# Patient Record
Sex: Female | Born: 1960 | Race: Black or African American | Hispanic: No | Marital: Single | Smoking: Never smoker
Health system: Southern US, Community
[De-identification: ages and names within clinical notes are randomized; demographics above are authoritative.]

## PROBLEM LIST (undated history)

## (undated) DIAGNOSIS — I639 Cerebral infarction, unspecified: Secondary | ICD-10-CM

## (undated) DIAGNOSIS — I10 Essential (primary) hypertension: Secondary | ICD-10-CM

## (undated) DIAGNOSIS — F329 Major depressive disorder, single episode, unspecified: Secondary | ICD-10-CM

## (undated) DIAGNOSIS — D649 Anemia, unspecified: Secondary | ICD-10-CM

## (undated) DIAGNOSIS — B1920 Unspecified viral hepatitis C without hepatic coma: Secondary | ICD-10-CM

## (undated) DIAGNOSIS — F32A Depression, unspecified: Secondary | ICD-10-CM

## (undated) DIAGNOSIS — J45909 Unspecified asthma, uncomplicated: Secondary | ICD-10-CM

## (undated) DIAGNOSIS — T7840XA Allergy, unspecified, initial encounter: Secondary | ICD-10-CM

## (undated) HISTORY — DX: Unspecified asthma, uncomplicated: J45.909

## (undated) HISTORY — DX: Anemia, unspecified: D64.9

## (undated) HISTORY — DX: Allergy, unspecified, initial encounter: T78.40XA

## (undated) HISTORY — PX: ANKLE FRACTURE SURGERY: SHX122

## (undated) HISTORY — PX: ABDOMINAL HYSTERECTOMY: SHX81

## (undated) HISTORY — DX: Major depressive disorder, single episode, unspecified: F32.9

## (undated) HISTORY — DX: Depression, unspecified: F32.A

---

## 1997-07-24 ENCOUNTER — Emergency Department (HOSPITAL_COMMUNITY): Admission: EM | Admit: 1997-07-24 | Discharge: 1997-07-24 | Payer: Self-pay | Admitting: *Deleted

## 1997-09-02 ENCOUNTER — Other Ambulatory Visit: Admission: RE | Admit: 1997-09-02 | Discharge: 1997-09-02 | Payer: Self-pay

## 1997-09-10 ENCOUNTER — Ambulatory Visit (HOSPITAL_COMMUNITY): Admission: RE | Admit: 1997-09-10 | Discharge: 1997-09-10 | Payer: Self-pay | Admitting: Family Medicine

## 1998-01-29 ENCOUNTER — Encounter: Payer: Self-pay | Admitting: Emergency Medicine

## 1998-01-29 ENCOUNTER — Emergency Department (HOSPITAL_COMMUNITY): Admission: EM | Admit: 1998-01-29 | Discharge: 1998-01-29 | Payer: Self-pay | Admitting: Emergency Medicine

## 1998-04-03 ENCOUNTER — Encounter: Payer: Self-pay | Admitting: Orthopedic Surgery

## 1998-04-04 ENCOUNTER — Ambulatory Visit (HOSPITAL_COMMUNITY): Admission: RE | Admit: 1998-04-04 | Discharge: 1998-04-04 | Payer: Self-pay | Admitting: Orthopedic Surgery

## 1998-04-13 ENCOUNTER — Observation Stay (HOSPITAL_COMMUNITY): Admission: RE | Admit: 1998-04-13 | Discharge: 1998-04-14 | Payer: Self-pay | Admitting: Orthopedic Surgery

## 1998-04-13 ENCOUNTER — Encounter: Payer: Self-pay | Admitting: Orthopedic Surgery

## 1998-07-13 ENCOUNTER — Ambulatory Visit (HOSPITAL_COMMUNITY): Admission: RE | Admit: 1998-07-13 | Discharge: 1998-07-13 | Payer: Self-pay | Admitting: Orthopedic Surgery

## 1999-03-11 ENCOUNTER — Emergency Department (HOSPITAL_COMMUNITY): Admission: EM | Admit: 1999-03-11 | Discharge: 1999-03-11 | Payer: Self-pay | Admitting: Emergency Medicine

## 1999-12-27 ENCOUNTER — Other Ambulatory Visit: Admission: RE | Admit: 1999-12-27 | Discharge: 1999-12-27 | Payer: Self-pay | Admitting: Obstetrics

## 2000-01-13 ENCOUNTER — Ambulatory Visit (HOSPITAL_COMMUNITY): Admission: RE | Admit: 2000-01-13 | Discharge: 2000-01-13 | Payer: Self-pay | Admitting: Obstetrics

## 2000-01-13 ENCOUNTER — Encounter: Payer: Self-pay | Admitting: Obstetrics

## 2000-06-26 ENCOUNTER — Emergency Department (HOSPITAL_COMMUNITY): Admission: EM | Admit: 2000-06-26 | Discharge: 2000-06-26 | Payer: Self-pay | Admitting: Emergency Medicine

## 2001-02-27 ENCOUNTER — Encounter: Payer: Self-pay | Admitting: Obstetrics

## 2001-02-27 ENCOUNTER — Inpatient Hospital Stay (HOSPITAL_COMMUNITY): Admission: AD | Admit: 2001-02-27 | Discharge: 2001-02-27 | Payer: Self-pay | Admitting: Obstetrics

## 2001-03-19 ENCOUNTER — Emergency Department (HOSPITAL_COMMUNITY): Admission: EM | Admit: 2001-03-19 | Discharge: 2001-03-19 | Payer: Self-pay | Admitting: Emergency Medicine

## 2001-03-19 ENCOUNTER — Encounter: Payer: Self-pay | Admitting: Emergency Medicine

## 2002-05-29 ENCOUNTER — Encounter: Payer: Self-pay | Admitting: *Deleted

## 2002-05-29 ENCOUNTER — Emergency Department (HOSPITAL_COMMUNITY): Admission: EM | Admit: 2002-05-29 | Discharge: 2002-05-29 | Payer: Self-pay | Admitting: *Deleted

## 2003-07-14 ENCOUNTER — Emergency Department (HOSPITAL_COMMUNITY): Admission: EM | Admit: 2003-07-14 | Discharge: 2003-07-14 | Payer: Self-pay | Admitting: *Deleted

## 2004-04-05 ENCOUNTER — Emergency Department (HOSPITAL_COMMUNITY): Admission: EM | Admit: 2004-04-05 | Discharge: 2004-04-05 | Payer: Self-pay | Admitting: Emergency Medicine

## 2004-07-23 ENCOUNTER — Ambulatory Visit (HOSPITAL_COMMUNITY): Admission: RE | Admit: 2004-07-23 | Discharge: 2004-07-23 | Payer: Self-pay | Admitting: Family Medicine

## 2004-11-26 ENCOUNTER — Emergency Department (HOSPITAL_COMMUNITY): Admission: EM | Admit: 2004-11-26 | Discharge: 2004-11-26 | Payer: Self-pay | Admitting: Family Medicine

## 2005-07-15 ENCOUNTER — Encounter: Admission: RE | Admit: 2005-07-15 | Discharge: 2005-07-15 | Payer: Self-pay | Admitting: Cardiology

## 2005-10-12 ENCOUNTER — Emergency Department (HOSPITAL_COMMUNITY): Admission: EM | Admit: 2005-10-12 | Discharge: 2005-10-12 | Payer: Self-pay | Admitting: Emergency Medicine

## 2006-01-06 ENCOUNTER — Ambulatory Visit: Payer: Self-pay | Admitting: Internal Medicine

## 2006-01-25 ENCOUNTER — Ambulatory Visit: Payer: Self-pay | Admitting: Internal Medicine

## 2006-01-27 ENCOUNTER — Ambulatory Visit: Payer: Self-pay | Admitting: Internal Medicine

## 2006-01-27 ENCOUNTER — Ambulatory Visit: Payer: Self-pay | Admitting: *Deleted

## 2006-02-03 ENCOUNTER — Ambulatory Visit: Payer: Self-pay | Admitting: Internal Medicine

## 2006-02-24 ENCOUNTER — Emergency Department (HOSPITAL_COMMUNITY): Admission: EM | Admit: 2006-02-24 | Discharge: 2006-02-24 | Payer: Self-pay | Admitting: Emergency Medicine

## 2006-04-24 ENCOUNTER — Emergency Department (HOSPITAL_COMMUNITY): Admission: EM | Admit: 2006-04-24 | Discharge: 2006-04-24 | Payer: Self-pay | Admitting: Emergency Medicine

## 2006-05-25 ENCOUNTER — Emergency Department (HOSPITAL_COMMUNITY): Admission: EM | Admit: 2006-05-25 | Discharge: 2006-05-25 | Payer: Self-pay | Admitting: Emergency Medicine

## 2006-08-20 ENCOUNTER — Emergency Department (HOSPITAL_COMMUNITY): Admission: EM | Admit: 2006-08-20 | Discharge: 2006-08-20 | Payer: Self-pay | Admitting: *Deleted

## 2006-10-25 ENCOUNTER — Encounter (INDEPENDENT_AMBULATORY_CARE_PROVIDER_SITE_OTHER): Payer: Self-pay | Admitting: *Deleted

## 2006-11-27 DIAGNOSIS — R809 Proteinuria, unspecified: Secondary | ICD-10-CM | POA: Insufficient documentation

## 2006-11-27 DIAGNOSIS — I10 Essential (primary) hypertension: Secondary | ICD-10-CM

## 2006-11-27 DIAGNOSIS — E119 Type 2 diabetes mellitus without complications: Secondary | ICD-10-CM

## 2006-12-18 ENCOUNTER — Emergency Department (HOSPITAL_COMMUNITY): Admission: EM | Admit: 2006-12-18 | Discharge: 2006-12-18 | Payer: Self-pay | Admitting: Emergency Medicine

## 2007-01-18 ENCOUNTER — Ambulatory Visit: Payer: Self-pay | Admitting: Internal Medicine

## 2007-02-16 ENCOUNTER — Ambulatory Visit: Payer: Self-pay | Admitting: Family Medicine

## 2007-02-19 ENCOUNTER — Ambulatory Visit: Payer: Self-pay | Admitting: Internal Medicine

## 2007-04-10 ENCOUNTER — Encounter: Payer: Self-pay | Admitting: Internal Medicine

## 2007-04-10 ENCOUNTER — Ambulatory Visit: Payer: Self-pay | Admitting: Internal Medicine

## 2007-07-05 ENCOUNTER — Ambulatory Visit: Payer: Self-pay | Admitting: Internal Medicine

## 2007-07-24 ENCOUNTER — Emergency Department (HOSPITAL_COMMUNITY): Admission: EM | Admit: 2007-07-24 | Discharge: 2007-07-24 | Payer: Self-pay | Admitting: Emergency Medicine

## 2007-10-29 ENCOUNTER — Emergency Department (HOSPITAL_COMMUNITY): Admission: EM | Admit: 2007-10-29 | Discharge: 2007-10-29 | Payer: Self-pay | Admitting: Family Medicine

## 2007-12-24 ENCOUNTER — Emergency Department (HOSPITAL_COMMUNITY): Admission: EM | Admit: 2007-12-24 | Discharge: 2007-12-24 | Payer: Self-pay | Admitting: Family Medicine

## 2008-02-15 ENCOUNTER — Ambulatory Visit: Payer: Self-pay | Admitting: Internal Medicine

## 2008-02-18 ENCOUNTER — Ambulatory Visit: Payer: Self-pay | Admitting: Internal Medicine

## 2008-03-11 ENCOUNTER — Ambulatory Visit: Payer: Self-pay | Admitting: Internal Medicine

## 2008-03-27 ENCOUNTER — Emergency Department (HOSPITAL_COMMUNITY): Admission: EM | Admit: 2008-03-27 | Discharge: 2008-03-27 | Payer: Self-pay | Admitting: Emergency Medicine

## 2008-04-24 ENCOUNTER — Ambulatory Visit: Payer: Self-pay | Admitting: Internal Medicine

## 2008-07-11 ENCOUNTER — Emergency Department (HOSPITAL_COMMUNITY): Admission: EM | Admit: 2008-07-11 | Discharge: 2008-07-11 | Payer: Self-pay | Admitting: Family Medicine

## 2008-07-24 ENCOUNTER — Inpatient Hospital Stay (HOSPITAL_COMMUNITY): Admission: AD | Admit: 2008-07-24 | Discharge: 2008-07-24 | Payer: Self-pay | Admitting: Obstetrics and Gynecology

## 2008-08-20 ENCOUNTER — Ambulatory Visit: Payer: Self-pay | Admitting: Obstetrics and Gynecology

## 2008-08-20 ENCOUNTER — Other Ambulatory Visit: Admission: RE | Admit: 2008-08-20 | Discharge: 2008-08-20 | Payer: Self-pay | Admitting: Obstetrics and Gynecology

## 2008-09-04 ENCOUNTER — Ambulatory Visit: Payer: Self-pay | Admitting: Obstetrics and Gynecology

## 2008-10-01 ENCOUNTER — Inpatient Hospital Stay (HOSPITAL_COMMUNITY): Admission: AD | Admit: 2008-10-01 | Discharge: 2008-10-01 | Payer: Self-pay | Admitting: Obstetrics and Gynecology

## 2008-10-24 ENCOUNTER — Ambulatory Visit: Payer: Self-pay | Admitting: Internal Medicine

## 2008-10-24 LAB — CONVERTED CEMR LAB
BUN: 11 mg/dL (ref 6–23)
CO2: 21 meq/L (ref 19–32)
Cholesterol: 140 mg/dL (ref 0–200)
Glucose, Bld: 207 mg/dL — ABNORMAL HIGH (ref 70–99)
HDL: 36 mg/dL — ABNORMAL LOW (ref >39)
LDL Cholesterol: 56 mg/dL (ref 0–99)
Sodium: 137 meq/L (ref 135–145)

## 2009-03-18 ENCOUNTER — Inpatient Hospital Stay (HOSPITAL_COMMUNITY): Admission: AD | Admit: 2009-03-18 | Discharge: 2009-03-18 | Payer: Self-pay | Admitting: Obstetrics and Gynecology

## 2009-05-21 ENCOUNTER — Ambulatory Visit: Payer: Self-pay | Admitting: Obstetrics & Gynecology

## 2009-06-10 ENCOUNTER — Ambulatory Visit: Payer: Self-pay | Admitting: Obstetrics & Gynecology

## 2009-07-03 ENCOUNTER — Ambulatory Visit: Payer: Self-pay | Admitting: Internal Medicine

## 2009-09-10 ENCOUNTER — Ambulatory Visit: Payer: Self-pay | Admitting: Obstetrics & Gynecology

## 2009-09-10 ENCOUNTER — Encounter: Payer: Self-pay | Admitting: Obstetrics & Gynecology

## 2009-09-10 ENCOUNTER — Inpatient Hospital Stay (HOSPITAL_COMMUNITY): Admission: RE | Admit: 2009-09-10 | Discharge: 2009-09-12 | Payer: Self-pay | Admitting: Obstetrics & Gynecology

## 2009-09-22 ENCOUNTER — Ambulatory Visit: Payer: Self-pay | Admitting: Family Medicine

## 2009-09-25 ENCOUNTER — Ambulatory Visit: Payer: Self-pay | Admitting: Internal Medicine

## 2010-01-12 ENCOUNTER — Inpatient Hospital Stay (HOSPITAL_COMMUNITY)
Admission: AD | Admit: 2010-01-12 | Discharge: 2010-01-12 | Payer: Self-pay | Source: Home / Self Care | Admitting: Family Medicine

## 2010-02-04 ENCOUNTER — Ambulatory Visit: Payer: Self-pay | Admitting: Obstetrics & Gynecology

## 2010-02-27 ENCOUNTER — Encounter: Payer: Self-pay | Admitting: Obstetrics

## 2010-04-19 LAB — URINALYSIS, ROUTINE W REFLEX MICROSCOPIC
Bilirubin Urine: NEGATIVE
Glucose, UA: 250 mg/dL — AB
Nitrite: NEGATIVE

## 2010-04-19 LAB — URINE MICROSCOPIC-ADD ON

## 2010-04-19 LAB — GC/CHLAMYDIA PROBE AMP, URINE
Chlamydia, Swab/Urine, PCR: NEGATIVE
GC Probe Amp, Urine: NEGATIVE

## 2010-04-19 LAB — WET PREP, GENITAL: Clue Cells Wet Prep HPF POC: NONE SEEN

## 2010-04-19 LAB — URINE CULTURE: Culture  Setup Time: 201112061038

## 2010-04-23 LAB — GLUCOSE, CAPILLARY
Glucose-Capillary: 112 mg/dL — ABNORMAL HIGH (ref 70–99)
Glucose-Capillary: 128 mg/dL — ABNORMAL HIGH (ref 70–99)
Glucose-Capillary: 173 mg/dL — ABNORMAL HIGH (ref 70–99)
Glucose-Capillary: 176 mg/dL — ABNORMAL HIGH (ref 70–99)
Glucose-Capillary: 188 mg/dL — ABNORMAL HIGH (ref 70–99)

## 2010-04-23 LAB — CBC
HCT: 30.4 % — ABNORMAL LOW (ref 36.0–46.0)
Hemoglobin: 10.2 g/dL — ABNORMAL LOW (ref 12.0–15.0)
MCH: 29.1 pg (ref 26.0–34.0)
MCH: 29.9 pg (ref 26.0–34.0)
MCV: 86.8 fL (ref 78.0–100.0)
MCV: 86.9 fL (ref 78.0–100.0)
Platelets: 119 10*3/uL — ABNORMAL LOW (ref 150–400)
Platelets: 138 10*3/uL — ABNORMAL LOW (ref 150–400)
RBC: 3.51 MIL/uL — ABNORMAL LOW (ref 3.87–5.11)
RDW: 13.7 % (ref 11.5–15.5)
RDW: 13.8 % (ref 11.5–15.5)
WBC: 9.1 10*3/uL (ref 4.0–10.5)

## 2010-04-23 LAB — HEMOGLOBIN AND HEMATOCRIT, BLOOD: HCT: 29.3 % — ABNORMAL LOW (ref 36.0–46.0)

## 2010-04-24 LAB — SURGICAL PCR SCREEN
MRSA, PCR: NEGATIVE
Staphylococcus aureus: NEGATIVE

## 2010-04-24 LAB — BASIC METABOLIC PANEL
Calcium: 9.2 mg/dL (ref 8.4–10.5)
GFR calc Af Amer: >60 mL/min (ref >60)
GFR calc non Af Amer: >60 mL/min (ref >60)
Potassium: 4.5 mEq/L (ref 3.5–5.1)
Sodium: 131 mEq/L — ABNORMAL LOW (ref 135–145)

## 2010-04-24 LAB — CBC
HCT: 38.3 % (ref 36.0–46.0)
MCHC: 34.3 g/dL (ref 30.0–36.0)
Platelets: 156 10*3/uL (ref 150–400)
RDW: 13.9 % (ref 11.5–15.5)
WBC: 7.1 10*3/uL (ref 4.0–10.5)

## 2010-04-28 LAB — URINALYSIS, ROUTINE W REFLEX MICROSCOPIC
Bilirubin Urine: NEGATIVE
Glucose, UA: 250 mg/dL — AB
Ketones, ur: 15 mg/dL — AB
Nitrite: POSITIVE — AB
Specific Gravity, Urine: >1.03 — ABNORMAL HIGH (ref 1.005–1.030)
pH: 5 (ref 5.0–8.0)

## 2010-04-28 LAB — GC/CHLAMYDIA PROBE AMP, GENITAL
Chlamydia, DNA Probe: NEGATIVE
GC Probe Amp, Genital: NEGATIVE

## 2010-04-28 LAB — WET PREP, GENITAL

## 2010-04-28 LAB — CBC
Platelets: 199 10*3/uL (ref 150–400)
RBC: 4.88 MIL/uL (ref 3.87–5.11)
WBC: 7.5 10*3/uL (ref 4.0–10.5)

## 2010-04-28 LAB — COMPREHENSIVE METABOLIC PANEL
ALT: 45 U/L — ABNORMAL HIGH (ref 0–35)
AST: 48 U/L — ABNORMAL HIGH (ref 0–37)
Albumin: 3.5 g/dL (ref 3.5–5.2)
Alkaline Phosphatase: 106 U/L (ref 39–117)
CO2: 25 mEq/L (ref 19–32)
Chloride: 97 mEq/L (ref 96–112)
GFR calc Af Amer: >60 mL/min (ref >60)
GFR calc non Af Amer: >60 mL/min (ref >60)
Potassium: 4.6 mEq/L (ref 3.5–5.1)
Total Bilirubin: 0.5 mg/dL (ref 0.3–1.2)

## 2010-04-28 LAB — POCT PREGNANCY, URINE: Preg Test, Ur: NEGATIVE

## 2010-04-28 LAB — URINE CULTURE: Colony Count: >=100000

## 2010-04-28 LAB — URINE MICROSCOPIC-ADD ON

## 2010-05-15 LAB — URINALYSIS, ROUTINE W REFLEX MICROSCOPIC
Bilirubin Urine: NEGATIVE
Ketones, ur: NEGATIVE mg/dL
Nitrite: NEGATIVE
Protein, ur: NEGATIVE mg/dL
Urobilinogen, UA: 0.2 mg/dL (ref 0.0–1.0)

## 2010-05-15 LAB — WET PREP, GENITAL
Trich, Wet Prep: NONE SEEN
Yeast Wet Prep HPF POC: NONE SEEN

## 2010-05-15 LAB — GC/CHLAMYDIA PROBE AMP, GENITAL
Chlamydia, DNA Probe: NEGATIVE
GC Probe Amp, Genital: NEGATIVE

## 2010-05-15 LAB — URINE MICROSCOPIC-ADD ON

## 2010-05-15 LAB — POCT PREGNANCY, URINE: Preg Test, Ur: NEGATIVE

## 2010-05-17 LAB — WET PREP, GENITAL
WBC, Wet Prep HPF POC: NONE SEEN
Yeast Wet Prep HPF POC: NONE SEEN

## 2010-05-17 LAB — POCT I-STAT, CHEM 8
Calcium, Ion: 1.18 mmol/L (ref 1.12–1.32)
Chloride: 102 mEq/L (ref 96–112)
Creatinine, Ser: 0.8 mg/dL (ref 0.4–1.2)
Glucose, Bld: 205 mg/dL — ABNORMAL HIGH (ref 70–99)
Potassium: 3.9 mEq/L (ref 3.5–5.1)

## 2010-05-17 LAB — URINALYSIS, ROUTINE W REFLEX MICROSCOPIC
Bilirubin Urine: NEGATIVE
Glucose, UA: >1000 mg/dL — AB
Nitrite: NEGATIVE
Specific Gravity, Urine: 1.02 (ref 1.005–1.030)
pH: 6 (ref 5.0–8.0)

## 2010-05-17 LAB — CBC
HCT: 35.5 % — ABNORMAL LOW (ref 36.0–46.0)
Hemoglobin: 12.2 g/dL (ref 12.0–15.0)
MCV: 85.9 fL (ref 78.0–100.0)
RDW: 13.9 % (ref 11.5–15.5)
WBC: 6.2 10*3/uL (ref 4.0–10.5)

## 2010-05-17 LAB — GC/CHLAMYDIA PROBE AMP, GENITAL
Chlamydia, DNA Probe: NEGATIVE
GC Probe Amp, Genital: NEGATIVE

## 2010-05-17 LAB — URINE MICROSCOPIC-ADD ON

## 2010-05-17 LAB — POCT PREGNANCY, URINE: Preg Test, Ur: NEGATIVE

## 2010-06-16 ENCOUNTER — Ambulatory Visit (INDEPENDENT_AMBULATORY_CARE_PROVIDER_SITE_OTHER): Payer: Self-pay

## 2010-06-16 ENCOUNTER — Inpatient Hospital Stay (INDEPENDENT_AMBULATORY_CARE_PROVIDER_SITE_OTHER)
Admission: RE | Admit: 2010-06-16 | Discharge: 2010-06-16 | Disposition: A | Discharge status: Home / Self Care | Payer: Self-pay | Source: Ambulatory Visit | Attending: Emergency Medicine | Admitting: Emergency Medicine

## 2010-06-16 DIAGNOSIS — J019 Acute sinusitis, unspecified: Secondary | ICD-10-CM

## 2010-06-16 DIAGNOSIS — J45909 Unspecified asthma, uncomplicated: Secondary | ICD-10-CM

## 2010-06-16 LAB — POCT I-STAT, CHEM 8
Calcium, Ion: 1.26 mmol/L (ref 1.12–1.32)
Chloride: 102 mEq/L (ref 96–112)
Creatinine, Ser: 1.1 mg/dL (ref 0.4–1.2)
Glucose, Bld: 265 mg/dL — ABNORMAL HIGH (ref 70–99)
Potassium: 4.1 mEq/L (ref 3.5–5.1)

## 2010-09-05 ENCOUNTER — Inpatient Hospital Stay (INDEPENDENT_AMBULATORY_CARE_PROVIDER_SITE_OTHER)
Admission: RE | Admit: 2010-09-05 | Discharge: 2010-09-05 | Disposition: A | Discharge status: Home / Self Care | Payer: Self-pay | Source: Ambulatory Visit | Attending: Family Medicine | Admitting: Family Medicine

## 2010-09-05 ENCOUNTER — Emergency Department (HOSPITAL_COMMUNITY)
Admission: EM | Admit: 2010-09-05 | Discharge: 2010-09-05 | Discharge status: Against Medical Advice | Payer: Self-pay | Attending: Emergency Medicine | Admitting: Emergency Medicine

## 2010-09-05 DIAGNOSIS — E119 Type 2 diabetes mellitus without complications: Secondary | ICD-10-CM | POA: Insufficient documentation

## 2010-09-05 DIAGNOSIS — R109 Unspecified abdominal pain: Secondary | ICD-10-CM | POA: Insufficient documentation

## 2010-09-05 DIAGNOSIS — M545 Low back pain, unspecified: Secondary | ICD-10-CM | POA: Insufficient documentation

## 2010-09-05 DIAGNOSIS — I10 Essential (primary) hypertension: Secondary | ICD-10-CM | POA: Insufficient documentation

## 2010-09-05 DIAGNOSIS — N390 Urinary tract infection, site not specified: Secondary | ICD-10-CM

## 2010-09-05 LAB — DIFFERENTIAL
Eosinophils Absolute: 0.3 10*3/uL (ref 0.0–0.7)
Lymphs Abs: 2.3 10*3/uL (ref 0.7–4.0)
Monocytes Absolute: 0.8 10*3/uL (ref 0.1–1.0)
Monocytes Relative: 7 % (ref 3–12)
Neutrophils Relative %: 70 % (ref 43–77)

## 2010-09-05 LAB — POCT I-STAT, CHEM 8
BUN: 13 mg/dL (ref 6–23)
Calcium, Ion: 1.14 mmol/L (ref 1.12–1.32)
Creatinine, Ser: 0.9 mg/dL (ref 0.50–1.10)
Glucose, Bld: 292 mg/dL — ABNORMAL HIGH (ref 70–99)
Sodium: 137 mEq/L (ref 135–145)
TCO2: 25 mmol/L (ref 0–100)

## 2010-09-05 LAB — POCT URINALYSIS DIP (DEVICE)
Ketones, ur: NEGATIVE mg/dL
Protein, ur: 30 mg/dL — AB
Specific Gravity, Urine: 1.015 (ref 1.005–1.030)
Urobilinogen, UA: 0.2 mg/dL (ref 0.0–1.0)
pH: 5 (ref 5.0–8.0)

## 2010-09-05 LAB — CBC
MCH: 29.4 pg (ref 26.0–34.0)
MCHC: 35.4 g/dL (ref 30.0–36.0)
MCV: 83 fL (ref 78.0–100.0)
Platelets: 266 10*3/uL (ref 150–400)
RBC: 5.17 MIL/uL — ABNORMAL HIGH (ref 3.87–5.11)

## 2010-09-07 ENCOUNTER — Emergency Department (HOSPITAL_COMMUNITY)
Admission: EM | Admit: 2010-09-07 | Discharge: 2010-09-07 | Disposition: A | Discharge status: Home / Self Care | Payer: Self-pay | Attending: Emergency Medicine | Admitting: Emergency Medicine

## 2010-09-07 ENCOUNTER — Emergency Department (HOSPITAL_COMMUNITY): Payer: Self-pay

## 2010-09-07 DIAGNOSIS — E119 Type 2 diabetes mellitus without complications: Secondary | ICD-10-CM | POA: Insufficient documentation

## 2010-09-07 DIAGNOSIS — R112 Nausea with vomiting, unspecified: Secondary | ICD-10-CM | POA: Insufficient documentation

## 2010-09-07 DIAGNOSIS — R109 Unspecified abdominal pain: Secondary | ICD-10-CM | POA: Insufficient documentation

## 2010-09-07 DIAGNOSIS — Z794 Long term (current) use of insulin: Secondary | ICD-10-CM | POA: Insufficient documentation

## 2010-09-07 DIAGNOSIS — N12 Tubulo-interstitial nephritis, not specified as acute or chronic: Secondary | ICD-10-CM | POA: Insufficient documentation

## 2010-09-07 DIAGNOSIS — R197 Diarrhea, unspecified: Secondary | ICD-10-CM | POA: Insufficient documentation

## 2010-09-07 DIAGNOSIS — I10 Essential (primary) hypertension: Secondary | ICD-10-CM | POA: Insufficient documentation

## 2010-09-07 LAB — CBC
HCT: 39.9 % (ref 36.0–46.0)
MCV: 83.5 fL (ref 78.0–100.0)
RBC: 4.78 MIL/uL (ref 3.87–5.11)
WBC: 6.9 10*3/uL (ref 4.0–10.5)

## 2010-09-07 LAB — GLUCOSE, CAPILLARY
Glucose-Capillary: 410 mg/dL — ABNORMAL HIGH (ref 70–99)
Glucose-Capillary: 201 mg/dL — ABNORMAL HIGH (ref 70–99)

## 2010-09-07 LAB — BASIC METABOLIC PANEL
BUN: 12 mg/dL (ref 6–23)
CO2: 28 mEq/L (ref 19–32)
Chloride: 94 mEq/L — ABNORMAL LOW (ref 96–112)
Creatinine, Ser: 0.85 mg/dL (ref 0.50–1.10)

## 2010-09-07 LAB — URINE MICROSCOPIC-ADD ON

## 2010-09-07 LAB — URINALYSIS, ROUTINE W REFLEX MICROSCOPIC
Bilirubin Urine: NEGATIVE
Protein, ur: 30 mg/dL — AB
Urobilinogen, UA: 1 mg/dL (ref 0.0–1.0)

## 2010-09-07 LAB — URINE CULTURE: Colony Count: >=100000

## 2010-09-07 LAB — DIFFERENTIAL
Lymphocytes Relative: 26 % (ref 12–46)
Lymphs Abs: 1.8 10*3/uL (ref 0.7–4.0)
Neutrophils Relative %: 63 % (ref 43–77)

## 2010-09-09 LAB — URINE CULTURE
Colony Count: >=100000
Culture  Setup Time: 201207311112

## 2010-11-04 LAB — POCT I-STAT, CHEM 8
BUN: 11
Calcium, Ion: 1.22
Creatinine, Ser: 1.1
TCO2: 24

## 2010-11-04 LAB — TSH: TSH: 1.78

## 2010-11-09 LAB — POCT I-STAT, CHEM 8
Calcium, Ion: 1.15 mmol/L (ref 1.12–1.32)
Creatinine, Ser: 1.1 mg/dL (ref 0.4–1.2)
Glucose, Bld: 181 mg/dL — ABNORMAL HIGH (ref 70–99)
Hemoglobin: 13.6 g/dL (ref 12.0–15.0)
Sodium: 138 mEq/L (ref 135–145)
TCO2: 25 mmol/L (ref 0–100)

## 2010-11-09 LAB — DIFFERENTIAL
Basophils Absolute: 0 10*3/uL (ref 0.0–0.1)
Basophils Relative: 0 % (ref 0–1)
Eosinophils Absolute: 0.2 10*3/uL (ref 0.0–0.7)
Neutro Abs: 5.5 10*3/uL (ref 1.7–7.7)
Neutrophils Relative %: 69 % (ref 43–77)

## 2010-11-09 LAB — POCT URINALYSIS DIP (DEVICE)
Nitrite: NEGATIVE
Protein, ur: 30 mg/dL — AB
Urobilinogen, UA: 0.2 mg/dL (ref 0.0–1.0)
pH: 5.5 (ref 5.0–8.0)

## 2010-11-09 LAB — CBC
MCHC: 33.2 g/dL (ref 30.0–36.0)
RDW: 14.2 % (ref 11.5–15.5)

## 2010-11-23 LAB — URINALYSIS, ROUTINE W REFLEX MICROSCOPIC
Bilirubin Urine: NEGATIVE
Nitrite: NEGATIVE
Specific Gravity, Urine: 1.014
pH: 5.5

## 2010-11-23 LAB — PREGNANCY, URINE: Preg Test, Ur: NEGATIVE

## 2010-11-23 LAB — WET PREP, GENITAL: Trich, Wet Prep: NONE SEEN

## 2010-11-23 LAB — RPR: RPR Ser Ql: NONREACTIVE

## 2011-01-09 ENCOUNTER — Emergency Department (HOSPITAL_COMMUNITY)
Admission: EM | Admit: 2011-01-09 | Discharge: 2011-01-09 | Disposition: A | Discharge status: Home / Self Care | Payer: Self-pay

## 2011-04-01 ENCOUNTER — Encounter (HOSPITAL_COMMUNITY): Payer: Self-pay | Admitting: *Deleted

## 2011-04-01 ENCOUNTER — Emergency Department (INDEPENDENT_AMBULATORY_CARE_PROVIDER_SITE_OTHER)
Admission: EM | Admit: 2011-04-01 | Discharge: 2011-04-01 | Disposition: A | Discharge status: Home Health | Payer: Self-pay | Source: Home / Self Care | Attending: Emergency Medicine | Admitting: Emergency Medicine

## 2011-04-01 ENCOUNTER — Telehealth (HOSPITAL_COMMUNITY): Payer: Self-pay | Admitting: *Deleted

## 2011-04-01 DIAGNOSIS — M109 Gout, unspecified: Secondary | ICD-10-CM

## 2011-04-01 HISTORY — DX: Essential (primary) hypertension: I10

## 2011-04-01 LAB — POCT I-STAT, CHEM 8
Chloride: 105 mEq/L (ref 96–112)
Glucose, Bld: 204 mg/dL — ABNORMAL HIGH (ref 70–99)
HCT: 39 % (ref 36.0–46.0)
Hemoglobin: 13.3 g/dL (ref 12.0–15.0)
Potassium: 4.1 mEq/L (ref 3.5–5.1)
Sodium: 141 mEq/L (ref 135–145)

## 2011-04-01 LAB — CBC
HCT: 37 % (ref 36.0–46.0)
MCHC: 34.1 g/dL (ref 30.0–36.0)
Platelets: 152 10*3/uL (ref 150–400)
RDW: 14 % (ref 11.5–15.5)
WBC: 5.5 10*3/uL (ref 4.0–10.5)

## 2011-04-01 LAB — URIC ACID: Uric Acid, Serum: 7.4 mg/dL — ABNORMAL HIGH (ref 2.4–7.0)

## 2011-04-01 MED ORDER — HYDROCODONE-ACETAMINOPHEN 5-500 MG PO TABS: 1.0000 | ORAL_TABLET | Freq: Four times a day (QID) | ORAL | Status: AC | PRN

## 2011-04-01 MED ORDER — ALLOPURINOL 100 MG PO TABS: 100.0000 mg | ORAL_TABLET | Freq: Two times a day (BID) | ORAL | Status: DC

## 2011-04-01 NOTE — ED Notes (Signed)
Pt is here with complaints of 2 day history of left foot pain that began in big toe.  Reports burning, redness and swelling.  Pt is type 2 diabetic and states sugars are not well controlled.  Mother has history of gout.

## 2011-04-01 NOTE — ED Provider Notes (Signed)
History     CSN: [id number]  Arrival date & time 04/01/11  4098   First MD Initiated Contact with Patient 04/01/11 [phone]      Chief Complaint  Patient presents with  . Foot Pain    (Consider location/radiation/quality/duration/timing/severity/associated sxs/prior treatment) HPI Comments: Patient presents urgent care with 2 day history of left great toe pain. Reports swelling, redness, denies any recent trauma or injury, denies any fevers. Patient reports of never had anything like this, mother describes her that she has a history of gout and that it looked just like hers.  Patient also reports that she is compliant with her insulin treatment and she continues to C. elevated sugar levels and she monitor at home ranging from the 300s to 400s at times fasting. Patient was last seen by her primary care doctor about 4 months ago was told that her sugars were elevated and modifications to her treatment were to the patient seems to be nonspecific about what those modifications were.  Patient is a 51 y.o. female presenting with lower extremity pain. The history is provided by the patient.  Foot Pain This is a new problem. The current episode started 2 days ago. The problem occurs constantly. The problem has been gradually worsening. The symptoms are aggravated by standing. The symptoms are relieved by nothing. She has tried nothing for the symptoms. The treatment provided no relief.    No past medical history on file.  No past surgical history on file.  No family history on file.  History  Substance Use Topics  . Smoking status: Not on file  . Smokeless tobacco: Not on file  . Alcohol Use: Not on file    OB History    No data available      Review of Systems  Constitutional: Positive for activity change. Negative for fever, chills and fatigue.  Musculoskeletal: Positive for joint swelling. Negative for back pain and arthralgias.    Allergies  Review of patient's allergies  indicates not on file.  Home Medications  No current outpatient prescriptions on file.  BP 163/97  Pulse 72  Temp(Src) 97.6 F (36.4 C) (Oral)  Resp 18  SpO2 100%  Physical Exam  Constitutional: She appears well-developed and well-nourished. She is active.  Non-toxic appearance. She does not have a sickly appearance. She does not appear ill. No distress.  HENT:  Head: Normocephalic.  Musculoskeletal:       Feet:  Skin: Rash noted.    ED Course  Procedures (including critical care time)   Labs Reviewed  URIC ACID  CBC   No results found.   No diagnosis found.    MDM          Jimmie Molly, MD 04/01/11 [phone]

## 2011-04-01 NOTE — Discharge Instructions (Signed)
  As discussed your exam was consistent with gout we have done some lab work and we will call you if abnormal test results to confirm whether or not your uric acid was elevated. In the meantime cold your clinic and followup with your primary care Dr. if no improvement is noted after 7-10 days or any worsening.   Arthritis - Gout Gout is caused by uric acid crystals forming in the joint. The big toe, foot, ankle, and knee are the joints most often affected.Often uric acid levels in the blood are also elevated. Symptoms develop rapidly, usually over hours. A joint fluid exam may be needed to prove the diagnosis. Acute gout episodes may follow a minor injury, surgery, illness or medication change. Gout occurs more often in men. It tends to be an inherited (passed down from a family member) condition. Your chance of having gout is increased if you take certain medications. These include water pills (diuretics), aspirin, niacin, and cyclosporine. Kidney disease and alcohol consumption may also increase your chances of getting gout. Months or years can pass between gout attacks.  Treatment includes ice, rest and raising the affected limb until the swelling and pain are better.Anti-inflammatory medicine usually brings about dramatic relief of pain, redness and swelling within 2 to 3 days. Other medications can also be effective. Increase your fluid intake. Do not drink alcohol or eat liver, sweetbreads, or sardines. Long-term gout management may require medicine to lower blood uric acid levels, or changing or stopping diuretics. Please see your caregiver if your condition is not better after 1 to 2 days of treatment.  SEEK IMMEDIATE MEDICAL CARE IF:  You have more serious symptoms such as a fever, skin rash, diarrhea, vomiting, or other joint pains. Document Released: 03/03/2004 Document Revised: 10/06/2010 Document Reviewed: 03/17/2008 [facility] Patient Information 2012 [place], [place].

## 2011-04-01 NOTE — ED Notes (Signed)
Uric Acid 7.4 H. Pt. adequately treated with Allopurinol and Vicodin.  Lab shown to Dr. Ladon Applebaum.  He said to call pt. and tell her it was gout and to f/u with her PCP at [facility]. I called pt. and left message to call. Vassie Moselle 04/01/2011

## 2011-04-04 ENCOUNTER — Telehealth (HOSPITAL_COMMUNITY): Payer: Self-pay | Admitting: *Deleted

## 2011-04-04 NOTE — ED Notes (Signed)
Pt. verified and given Uric Acid result. Pt. told it indicates she has gout. Pt. has an appt. at [facility] in March. Pt. instructed to let her doctor know about the results, so he can continue long term treatment. I explained if the uric acid is lowered it might help prevent frequent outbreaks of gout.  Pt. voiced understanding. Vassie Moselle 04/04/2011

## 2011-04-14 ENCOUNTER — Emergency Department (HOSPITAL_COMMUNITY): Payer: Medicaid Other

## 2011-04-14 ENCOUNTER — Emergency Department (HOSPITAL_COMMUNITY)
Admission: EM | Admit: 2011-04-14 | Discharge: 2011-04-14 | Disposition: A | Discharge status: Home / Self Care | Payer: Medicaid Other | Attending: Emergency Medicine | Admitting: Emergency Medicine

## 2011-04-14 ENCOUNTER — Encounter (HOSPITAL_COMMUNITY): Payer: Self-pay | Admitting: Emergency Medicine

## 2011-04-14 DIAGNOSIS — M109 Gout, unspecified: Secondary | ICD-10-CM | POA: Insufficient documentation

## 2011-04-14 DIAGNOSIS — Z794 Long term (current) use of insulin: Secondary | ICD-10-CM | POA: Insufficient documentation

## 2011-04-14 DIAGNOSIS — Z79899 Other long term (current) drug therapy: Secondary | ICD-10-CM | POA: Insufficient documentation

## 2011-04-14 DIAGNOSIS — E119 Type 2 diabetes mellitus without complications: Secondary | ICD-10-CM | POA: Insufficient documentation

## 2011-04-14 DIAGNOSIS — M79609 Pain in unspecified limb: Secondary | ICD-10-CM | POA: Insufficient documentation

## 2011-04-14 DIAGNOSIS — I10 Essential (primary) hypertension: Secondary | ICD-10-CM | POA: Insufficient documentation

## 2011-04-14 DIAGNOSIS — M25569 Pain in unspecified knee: Secondary | ICD-10-CM | POA: Insufficient documentation

## 2011-04-14 MED ORDER — OXYCODONE-ACETAMINOPHEN 5-325 MG PO TABS
1.0000 | ORAL_TABLET | Freq: Once | ORAL | Status: AC
  Administered 2011-04-14: 1 via ORAL
  Filled 2011-04-14: qty 1

## 2011-04-14 MED ORDER — OXYCODONE-ACETAMINOPHEN 5-325 MG PO TABS: 1.0000 | ORAL_TABLET | Freq: Four times a day (QID) | ORAL | Status: AC | PRN

## 2011-04-14 MED ORDER — PREDNISONE 50 MG PO TABS: 50.0000 mg | ORAL_TABLET | Freq: Every day | ORAL | Status: AC

## 2011-04-14 NOTE — ED Notes (Signed)
Pt. States pain began 2 days ago. States she was previously treated at [facility] for gout 12 days ago and thinks this it may be another flare. Pain in her left knee pain radiating up and down her leg. Knee is swollen. C/o of numbness in left leg aswell. Right foot is painful.

## 2011-04-14 NOTE — Discharge Instructions (Signed)
Followup the orthopedist provided.  Ice and heat on your knee.  Elevate her knee as well.

## 2011-04-16 NOTE — ED Provider Notes (Signed)
History     CSN: [id number]  Arrival date & time 04/14/11  1326   First MD Initiated Contact with Patient 04/14/11 1342      No chief complaint on file.   (Consider location/radiation/quality/duration/timing/severity/associated sxs/prior treatment) HPI Patient presents to the emergency department with pain in her left knee and right foot.  States she was seen previously for this [facility] emergency room and was only give allopurinol.  Patient states that her pain is getting worse.  States has not had any other medications for the pain.  Patient states that she is able to move her knee but there is difficulty because of pain.  Patient denies fever, shortness of breath, abdominal pain, weakness, vomiting/nausea, diarrhea, shortness of breath, or back pain. Past Medical History  Diagnosis Date  . Hypertension   . Diabetes mellitus   . Gout 12 days ago    No past surgical history on file.  No family history on file.  History  Substance Use Topics  . Smoking status: Never Smoker   . Smokeless tobacco: Not on file  . Alcohol Use: No    OB History    Grav Para Term Preterm Abortions TAB SAB Ect Mult Living                  Review of Systems All pertinent positives and negatives reviewed in the history of present illness  Allergies  Review of patient's allergies indicates no known allergies.  Home Medications   Current Outpatient Rx  Name Route Sig Dispense Refill  . ACETAMINOPHEN 500 MG PO TABS Oral Take 500 mg by mouth every 6 (six) hours as needed. pain    . ALBUTEROL SULFATE HFA 108 (90 BASE) MCG/ACT IN AERS Inhalation Inhale 2 puffs into the lungs every 6 (six) hours as needed. Wheezing and shortness of breath    . ALLOPURINOL 100 MG PO TABS Oral Take 1 tablet (100 mg total) by mouth 2 (two) times daily. 24 tablet 0  . GLIPIZIDE 10 MG PO TABS Oral Take 10 mg by mouth 2 (two) times daily before a meal.    . HYDROCHLOROTHIAZIDE 25 MG PO TABS Oral Take 25 mg by mouth  daily.    . INSULIN ASPART 100 UNIT/ML [place] SOLN Subcutaneous Inject 5 Units into the skin 3 (three) times daily before meals.     . INSULIN GLARGINE 100 UNIT/ML  SOLN Subcutaneous Inject 30 Units into the skin at bedtime.     Marland Kitchen METFORMIN HCL 500 MG PO TABS Oral Take 500 mg by mouth 2 (two) times daily with a meal.    . OXYCODONE-ACETAMINOPHEN 5-325 MG PO TABS Oral Take 1 tablet by mouth every 6 (six) hours as needed for pain. 15 tablet 0  . PREDNISONE 50 MG PO TABS Oral Take 1 tablet (50 mg total) by mouth daily. 5 tablet 0    BP 139/101  Pulse 83  Temp(Src) 97.6 F (36.4 C) (Oral)  Resp 18  SpO2 100%  Physical Exam  Constitutional: She is oriented to person, place, and time. She appears well-developed and well-nourished. No distress.  HENT:  Head: Normocephalic and atraumatic.  Cardiovascular: Normal rate, regular rhythm and normal heart sounds.  Exam reveals no gallop and no friction rub.   No murmur heard. Pulmonary/Chest: Effort normal and breath sounds normal.  Musculoskeletal:       Left knee: She exhibits normal range of motion, no swelling and no effusion.       Legs:  Left foot: She exhibits tenderness.       Feet:  Neurological: She is alert and oriented to person, place, and time.  Skin: Skin is warm and dry. No rash noted.    ED Course  Procedures (including critical care time)  Labs Reviewed - No data to display No results found.   1. Knee pain   2. Gout    advised patient followup with orthopedics.  She is advised to use ice and heat on her knee and foot and elevate as well.  She still to return to the emergency department as needed.  Advised her that she is to monitor blood sugars closely being on the prednisone.  I reviewed her previous visit to the emergency department.    MDM          Carlyle Dolly, PA-C 04/16/11 1552

## 2011-04-19 NOTE — ED Provider Notes (Signed)
Medical screening examination/treatment/procedure(s) were performed by non-physician practitioner and as supervising physician I was immediately available for consultation/collaboration.    Nelia Shi, MD 04/19/11 [phone]

## 2011-06-13 ENCOUNTER — Encounter (HOSPITAL_COMMUNITY): Payer: Self-pay | Admitting: *Deleted

## 2011-06-13 ENCOUNTER — Emergency Department (HOSPITAL_COMMUNITY): Payer: Medicaid Other

## 2011-06-13 ENCOUNTER — Emergency Department (HOSPITAL_COMMUNITY)
Admission: EM | Admit: 2011-06-13 | Discharge: 2011-06-13 | Disposition: A | Discharge status: Home / Self Care | Payer: Medicaid Other | Attending: Emergency Medicine | Admitting: Emergency Medicine

## 2011-06-13 DIAGNOSIS — M109 Gout, unspecified: Secondary | ICD-10-CM | POA: Insufficient documentation

## 2011-06-13 DIAGNOSIS — E119 Type 2 diabetes mellitus without complications: Secondary | ICD-10-CM | POA: Insufficient documentation

## 2011-06-13 DIAGNOSIS — Z79899 Other long term (current) drug therapy: Secondary | ICD-10-CM | POA: Insufficient documentation

## 2011-06-13 DIAGNOSIS — I10 Essential (primary) hypertension: Secondary | ICD-10-CM | POA: Insufficient documentation

## 2011-06-13 DIAGNOSIS — Z794 Long term (current) use of insulin: Secondary | ICD-10-CM | POA: Insufficient documentation

## 2011-06-13 MED ORDER — OXYCODONE-ACETAMINOPHEN 5-325 MG PO TABS
1.0000 | ORAL_TABLET | Freq: Once | ORAL | Status: AC
  Administered 2011-06-13: 1 via ORAL
  Filled 2011-06-13: qty 1

## 2011-06-13 MED ORDER — INDOMETHACIN 25 MG PO CAPS: 50.0000 mg | ORAL_CAPSULE | Freq: Three times a day (TID) | ORAL | Status: AC

## 2011-06-13 MED ORDER — PERCOCET 5-325 MG PO TABS: 1.0000 | ORAL_TABLET | Freq: Four times a day (QID) | ORAL | Status: AC | PRN

## 2011-06-13 NOTE — ED Provider Notes (Signed)
Medical screening examination/treatment/procedure(s) were performed by non-physician practitioner and as supervising physician I was immediately available for consultation/collaboration.  Hurman Horn, MD 06/13/11 [phone]

## 2011-06-13 NOTE — Discharge Instructions (Signed)
Please read the information below.  Adjust your diet to decrease the purines in your diet.  Follow up with your primary care provider next month as planned.  If you develop fevers greater than 100.4, uncontrolled pain, increased swelling, or any change in your symptoms, please return to the ER for a recheck.  You may return to the ER at any time for worsening condition or any new symptoms that concern you.  Gout Gout is an inflammatory condition (arthritis) caused by a buildup of uric acid crystals in the joints. Uric acid is a chemical that is normally present in the blood. Under some circumstances, uric acid can form into crystals in your joints. This causes joint redness, soreness, and swelling (inflammation). Repeat attacks are common. Over time, uric acid crystals can form into masses (tophi) near a joint, causing disfigurement. Gout is treatable and often preventable. CAUSES  The disease begins with elevated levels of uric acid in the blood. Uric acid is produced by your body when it breaks down a naturally found substance called purines. This also happens when you eat certain foods such as meats and fish. Causes of an elevated uric acid level include:  Being passed down from parent to child (heredity).   Diseases that cause increased uric acid production (obesity, psoriasis, some cancers).   Excessive alcohol use.   Diet, especially diets rich in meat and seafood.   Medicines, including certain cancer-fighting drugs (chemotherapy), diuretics, and aspirin.   Chronic kidney disease. The kidneys are no longer able to remove uric acid well.   Problems with metabolism.  Conditions strongly associated with gout include:  Obesity.   High blood pressure.   High cholesterol.   Diabetes.  Not everyone with elevated uric acid levels gets gout. It is not understood why some people get gout and others do not. Surgery, joint injury, and eating too much of certain foods are some of the factors  that can lead to gout. SYMPTOMS   An attack of gout comes on quickly. It causes intense pain with redness, swelling, and warmth in a joint.   Fever can occur.   Often, only one joint is involved. Certain joints are more commonly involved:   Base of the big toe.   Knee.   Ankle.   Wrist.   Finger.  Without treatment, an attack usually goes away in a few days to weeks. Between attacks, you usually will not have symptoms, which is different from many other forms of arthritis. DIAGNOSIS  Your caregiver will suspect gout based on your symptoms and exam. Removal of fluid from the joint (arthrocentesis) is done to check for uric acid crystals. Your caregiver will give you a medicine that numbs the area (local anesthetic) and use a needle to remove joint fluid for exam. Gout is confirmed when uric acid crystals are seen in joint fluid, using a special microscope. Sometimes, blood, urine, and X-ray tests are also used. TREATMENT  There are 2 phases to gout treatment: treating the sudden onset (acute) attack and preventing attacks (prophylaxis). Treatment of an Acute Attack  Medicines are used. These include anti-inflammatory medicines or steroid medicines.   An injection of steroid medicine into the affected joint is sometimes necessary.   The painful joint is rested. Movement can worsen the arthritis.   You may use warm or cold treatments on painful joints, depending which works best for you.   Discuss the use of coffee, vitamin C, or cherries with your caregiver. These may be helpful  treatment options.  Treatment to Prevent Attacks After the acute attack subsides, your caregiver may advise prophylactic medicine. These medicines either help your kidneys eliminate uric acid from your body or decrease your uric acid production. You may need to stay on these medicines for a very long time. The early phase of treatment with prophylactic medicine can be associated with an increase in acute  gout attacks. For this reason, during the first few months of treatment, your caregiver may also advise you to take medicines usually used for acute gout treatment. Be sure you understand your caregiver's directions. You should also discuss dietary treatment with your caregiver. Certain foods such as meats and fish can increase uric acid levels. Other foods such as dairy can decrease levels. Your caregiver can give you a list of foods to avoid. HOME CARE INSTRUCTIONS   Do not take aspirin to relieve pain. This raises uric acid levels.   Only take over-the-counter or prescription medicines for pain, discomfort, or fever as directed by your caregiver.   Rest the joint as much as possible. When in bed, keep sheets and blankets off painful areas.   Keep the affected joint raised (elevated).   Use crutches if the painful joint is in your leg.   Drink enough water and fluids to keep your urine clear or pale yellow. This helps your body get rid of uric acid. Do not drink alcoholic beverages. They slow the passage of uric acid.   Follow your caregiver's dietary instructions. Pay careful attention to the amount of protein you eat. Your daily diet should emphasize fruits, vegetables, whole grains, and fat-free or low-fat milk products.   Maintain a healthy body weight.  SEEK MEDICAL CARE IF:   You have an oral temperature above 102 F (38.9 C).   You develop diarrhea, vomiting, or any side effects from medicines.   You do not feel better in 24 hours, or you are getting worse.  SEEK IMMEDIATE MEDICAL CARE IF:   Your joint becomes suddenly more tender and you have:   Chills.   An oral temperature above 102 F (38.9 C), not controlled by medicine.  MAKE SURE YOU:   Understand these instructions.   Will watch your condition.   Will get help right away if you are not doing well or get worse.  Document Released: 01/22/2000 Document Revised: 01/13/2011 Document Reviewed:  05/04/2009 [facility] Patient Information 2012 [place], [place].   Purine Restricted Diet A low-purine diet consists of foods that reduce uric acid made in your body. INDICATIONS FOR USE  Your caregiver may ask you to follow a low-purine diet to reduce gout flairs.  GUIDELINES  Avoid high-purine foods, including all alcohol, yeast extracts taken as supplements, and sauces made from meats (like gravy). Do not eat high-purine meats, including anchovies, sardines, herring, mussels, tuna, codfish, scallops, trout, haddock, bacon, organ meats, tripe, goose, wild game, and sweetbreads.  Grains  Allowed/Recommended: All, except those listed to consume in moderation.   Consume in Moderation: Oatmeal (? cup uncooked daily), wheat bran or germ ( cup daily), and whole grains.  Vegetables  Allowed/Recommended: All, except those listed to consume in moderation.   Consume in Moderation: Asparagus, cauliflower, spinach, mushrooms, and green peas ( cup daily).  Fruit  Allowed/Recommended: All.   Consume in Moderation: None.  Meat and Meat Substitutes  Allowed/Recommended: Eggs, nuts, and peanut butter.   Consume in Moderation: Limit to 4 to 6 oz daily. Avoid high-purine meats. Lentils, peas, and dried beans (1  cup daily).  Milk  Allowed/Recommended: All. Choose low-fat or skim when possible.   Consume in Moderation: None.  Fats and Oils  Allowed/Recommended: All.   Consume in Moderation: None.  Beverages  Allowed/Recommended: All, except those listed to avoid.   Avoid: All alcohol.  Condiments/Miscellaneous  Allowed/Recommended: All, except those listed to consume in moderation.   Consume in Moderation: Bouillon and meat-based broths and soups.  Document Released: 05/21/2010 Document Revised: 01/13/2011 Document Reviewed: 05/21/2010 [facility] Patient Information 2012 [place], [place].

## 2011-06-13 NOTE — ED Notes (Signed)
Pt reports R foot pain, states feeling "burning" sensation around her R big toe area, reports hx of gout in the past.  Pt denies any injury.  Pt reports taking OTC pain med without relief.  Mild redness noted.

## 2011-06-13 NOTE — ED Provider Notes (Signed)
History     CSN: [id number]  Arrival date & time 06/13/11  0715   First MD Initiated Contact with Patient 06/13/11 [phone]      Chief Complaint  Patient presents with  . Foot Pain    (Consider location/radiation/quality/duration/timing/severity/associated sxs/prior treatment) HPI Comments: Patient reports right 1st MTP joint pain that began yesterday.  Pain is sharp and stabbing with radiation into the arch of her foot and her ankle.  Pain is exacerbated by palpation and ambulation.  Denies injury, fevers/chills, weakness or numbness of the extremities.  States she was recently diagnosed with gout and this feels like her previous gout flare.  Pt has a family hx of gout in her mother.  Pt has adjusted her diet by attempting to eat lower sodium foods to prevent gout (eating salads and fewer canned goods).  Pt has been seen twice in two months previously for this same problem.  Patient has an appointment with her PCP in June for further evaluation and management of her gout.    Patient is a 51 y.o. female presenting with lower extremity pain. The history is provided by the patient.  Foot Pain Pertinent negatives include no abdominal pain, chest pain, chills, fever, nausea, numbness, vomiting or weakness.    Past Medical History  Diagnosis Date  . Hypertension   . Diabetes mellitus   . Gout 12 days ago    History reviewed. No pertinent past surgical history.  No family history on file.  History  Substance Use Topics  . Smoking status: Never Smoker   . Smokeless tobacco: Not on file  . Alcohol Use: No    OB History    Grav Para Term Preterm Abortions TAB SAB Ect Mult Living                  Review of Systems  Constitutional: Negative for fever and chills.  Respiratory: Negative for shortness of breath.   Cardiovascular: Negative for chest pain.  Gastrointestinal: Negative for nausea, vomiting, abdominal pain and diarrhea.  Skin: Negative for wound.  Neurological: Negative  for weakness and numbness.    Allergies  Review of patient's allergies indicates no known allergies.  Home Medications   Current Outpatient Rx  Name Route Sig Dispense Refill  . ACETAMINOPHEN 500 MG PO TABS Oral Take 500 mg by mouth every 6 (six) hours as needed. pain    . ALBUTEROL SULFATE HFA 108 (90 BASE) MCG/ACT IN AERS Inhalation Inhale 2 puffs into the lungs every 6 (six) hours as needed. Wheezing and shortness of breath    . GLIPIZIDE 10 MG PO TABS Oral Take 10 mg by mouth 2 (two) times daily before a meal.    . HYDROCHLOROTHIAZIDE 25 MG PO TABS Oral Take 25 mg by mouth daily.    . INSULIN ASPART 100 UNIT/ML [place] SOLN Subcutaneous Inject 5 Units into the skin 3 (three) times daily before meals.     . INSULIN GLARGINE 100 UNIT/ML [place] SOLN Subcutaneous Inject 30 Units into the skin at bedtime.     Marland Kitchen METFORMIN HCL 500 MG PO TABS Oral Take 500 mg by mouth 2 (two) times daily with a meal.      BP 141/94  Pulse 77  Temp(Src) 97.8 F (36.6 C) (Oral)  Resp 18  SpO2 100%  Physical Exam  Nursing note and vitals reviewed. Constitutional: She is oriented to person, place, and time. She appears well-developed and well-nourished.  HENT:  Head: Normocephalic and atraumatic.  Neck: Neck  supple.  Cardiovascular: Normal rate and regular rhythm.   Pulmonary/Chest: Effort normal and breath sounds normal. No respiratory distress. She has no wheezes. She has no rales.  Musculoskeletal:       Right ankle: She exhibits normal range of motion and no swelling.       Feet:       Right 1st MTP joint is mildly erythematous, tender to palpation.  No edema, warmth.  Decreased active ROM of toes, secondary to pain.  Sensation intact, capillary refill < seconds.  Dorsalis pedis and posterior tibialis pulses intact and equal bilaterally.   Neurological: She is alert and oriented to person, place, and time.    ED Course  Procedures (including critical care time)  Labs Reviewed - No data to display Dg  Foot Complete Right  06/13/2011  *RADIOLOGY REPORT*  Clinical Data: Pain right foot and ankle for 3 weeks, swelling, first MTP joint pain, question gout  RIGHT FOOT COMPLETE - 3+ VIEW  Comparison: None  Findings: Osseous mineralization grossly normal for technique. Joint spaces preserved. Question small juxta-articular erosion at base of proximal phalanx second toe. No acute fracture, dislocation, or additional bone destruction. Question patchy lucency at distal tibia vs. fibula on lateral view of uncertain etiology. Soft tissues unremarkable.  IMPRESSION: Potential juxta-articular erosion at the second MTP joint, nonspecific, can be seen with inflammatory arthropathies or gout. No definite first MTP joint abnormalities seen. Question patchy lucency at the distal tibia versus fibula on lateral view, of uncertain etiology; potentially this could be related to old healed fractures and a screw track, which are reported on a prior study of 05/29/2002 though images from that exam are no longer available for comparison.  Original Report Authenticated By: Lollie Marrow, M.D.   8:48 AM I have personally reviewed the xray   1. Gout       MDM  Patient with hx gout presenting with pain consistent with typical gout flare.  Denies fevers or injury.  Doubt septic joint.  Xrays shows possible gouty changes to second joint, not 1st.  Pt has had three flares in three months - has an appointment with PCP next week.  I advised her to discuss with her PCP starting medications to control gout and have given her information on dietary changes to try.  Pt d/c home with indomethacin and percocet.  I did not start steroids given patient's diabetes.  Return precautions given.  Patient verbalizes understanding and agrees with plan.         Rise Patience, [place] 06/13/11 1023

## 2011-11-10 ENCOUNTER — Ambulatory Visit: Payer: Medicaid Other | Admitting: Obstetrics & Gynecology

## 2011-11-27 ENCOUNTER — Encounter (HOSPITAL_COMMUNITY): Payer: Self-pay | Admitting: *Deleted

## 2011-11-27 ENCOUNTER — Emergency Department (HOSPITAL_COMMUNITY)
Admission: EM | Admit: 2011-11-27 | Discharge: 2011-11-27 | Disposition: A | Discharge status: Home / Self Care | Payer: Medicaid Other | Attending: Emergency Medicine | Admitting: Emergency Medicine

## 2011-11-27 DIAGNOSIS — Z794 Long term (current) use of insulin: Secondary | ICD-10-CM | POA: Insufficient documentation

## 2011-11-27 DIAGNOSIS — I10 Essential (primary) hypertension: Secondary | ICD-10-CM | POA: Insufficient documentation

## 2011-11-27 DIAGNOSIS — Z79899 Other long term (current) drug therapy: Secondary | ICD-10-CM | POA: Insufficient documentation

## 2011-11-27 DIAGNOSIS — E119 Type 2 diabetes mellitus without complications: Secondary | ICD-10-CM | POA: Insufficient documentation

## 2011-11-27 DIAGNOSIS — M109 Gout, unspecified: Secondary | ICD-10-CM | POA: Insufficient documentation

## 2011-11-27 LAB — GLUCOSE, CAPILLARY: Glucose-Capillary: 267 mg/dL — ABNORMAL HIGH (ref 70–99)

## 2011-11-27 MED ORDER — PREDNISONE 20 MG PO TABS: ORAL_TABLET | ORAL | Status: DC

## 2011-11-27 MED ORDER — OXYCODONE-ACETAMINOPHEN 5-325 MG PO TABS
2.0000 | ORAL_TABLET | Freq: Once | ORAL | Status: AC
Start: 2011-11-27 — End: 2011-11-27
  Administered 2011-11-27: 2 via ORAL
  Filled 2011-11-27: qty 2

## 2011-11-27 MED ORDER — OXYCODONE-ACETAMINOPHEN 5-325 MG PO TABS: 2.0000 | ORAL_TABLET | ORAL | Status: DC | PRN

## 2011-11-27 MED ORDER — ALLOPURINOL 300 MG PO TABS: 300.0000 mg | ORAL_TABLET | Freq: Every day | ORAL | Status: DC

## 2011-11-27 NOTE — ED Provider Notes (Signed)
History     CSN: [id number]  Arrival date & time 11/27/11  1191   First MD Initiated Contact with Patient 11/27/11 0725      Chief Complaint  Patient presents with  . Gout    (Consider location/radiation/quality/duration/timing/severity/associated sxs/prior treatment) HPI This 51 year old female has a history of diabetes as well as gout and complains of a typical gout flareup in both of her feet in the first metatarsophalangeal joint for the last 3 days. She's had good relief with prednisone and pain medicines in the past. She ran out of her allopurinol a couple months ago and would like a refill on that as well and will start that once this flareup is improving. She is no fever chest pain cough shortness breath abdominal pain vomiting or other concerns. She actually is no redness to her feet this time. She just has pain to both feet but most of the pain is localized to the first metatarsal phalangeal joint both feet. This is typical flareup for her. She is typical severe pain worse with palpation movement and better she stays still. She is no weakness or numbness or color change to her feet she is no trauma. She does not have neuropathy in her feet at baseline. She is no pain to the rest of her legs no other concerns. There is no treatment prior to arrival. Her pain is severe. Past Medical History  Diagnosis Date  . Hypertension   . Diabetes mellitus   . Gout 12 days ago    History reviewed. No pertinent past surgical history.  History reviewed. No pertinent family history.  History  Substance Use Topics  . Smoking status: Never Smoker   . Smokeless tobacco: Not on file  . Alcohol Use: No    OB History    Grav Para Term Preterm Abortions TAB SAB Ect Mult Living                  Review of Systems 10 Systems reviewed and are negative for acute change except as noted in the HPI. Allergies  Review of patient's allergies indicates no known allergies.  Home Medications    Current Outpatient Rx  Name Route Sig Dispense Refill  . ACETAMINOPHEN 500 MG PO TABS Oral Take 500 mg by mouth every 6 (six) hours as needed. pain    . ALBUTEROL SULFATE HFA 108 (90 BASE) MCG/ACT IN AERS Inhalation Inhale 2 puffs into the lungs every 6 (six) hours as needed. Wheezing and shortness of breath    . ALLOPURINOL 300 MG PO TABS Oral Take 1 tablet (300 mg total) by mouth daily. 30 tablet 0  . GLIPIZIDE 10 MG PO TABS Oral Take 10 mg by mouth 2 (two) times daily before a meal.    . HYDROCHLOROTHIAZIDE 25 MG PO TABS Oral Take 25 mg by mouth daily.    . INSULIN ASPART 100 UNIT/ML [place] SOLN Subcutaneous Inject 5 Units into the skin 3 (three) times daily before meals.     . INSULIN GLARGINE 100 UNIT/ML [place] SOLN Subcutaneous Inject 30 Units into the skin at bedtime.     Marland Kitchen METFORMIN HCL 500 MG PO TABS Oral Take 500 mg by mouth 2 (two) times daily with a meal.    . OXYCODONE-ACETAMINOPHEN 5-325 MG PO TABS Oral Take 2 tablets by mouth every 4 (four) hours as needed for pain. 20 tablet 0  . PREDNISONE 20 MG PO TABS  3 tabs po day one, then 2 tabs daily x 4  days 11 tablet 0    BP 140/87  Pulse 84  Temp 97.9 F (36.6 C) (Oral)  Resp 14  Ht 5\' 6"  (1.676 m)  Wt 227 lb (102.967 kg)  BMI 36.64 kg/m2  SpO2 99%  Physical Exam  Nursing note and vitals reviewed. Constitutional:       Awake, alert, nontoxic appearance.  HENT:  Head: Atraumatic.  Eyes: Right eye exhibits no discharge. Left eye exhibits no discharge.  Neck: Neck supple.  Cardiovascular: Normal rate and regular rhythm.   No murmur heard. Pulmonary/Chest: Effort normal and breath sounds normal. No respiratory distress. She has no wheezes. She has no rales. She exhibits no tenderness.  Abdominal: Soft. There is no tenderness. There is no rebound.  Musculoskeletal: She exhibits tenderness. She exhibits no edema.       Baseline ROM, no obvious new focal weakness. Both arms are nontender. Both legs are nontender the thighs and  lower legs. Both feet are tender at the first metatarsophalangeal joints without erythema and with minimal swelling to the joints. The rest of her feet are nontender. Both feet have normal light touch with capillary refill less than 2 seconds. She has limited movement to her great toes to the pain. The rest of her toes are nontender. Both ankles are nontender and both mid feet are nontender.  Neurological:       Mental status and motor strength appears baseline for patient and situation.  Skin: No rash noted.  Psychiatric: She has a normal mood and affect.    ED Course  Procedures (including critical care time)  Labs Reviewed  GLUCOSE, CAPILLARY - Abnormal; Notable for the following:    Glucose-Capillary 267 (*)     All other components within normal limits  LAB REPORT - SCANNED   No results found.   1. Gout attack       MDM  Pt stable in ED with no significant deterioration in condition.  Patient / Family / Caregiver informed of clinical course, understand medical decision-making process, and agree with plan.        Hurman Horn, MD 12/02/11 2049

## 2011-11-27 NOTE — ED Notes (Addendum)
Pt presents to ed with c/o of gout flair up since Friday. Hx of gout and hx of diabetes. Pt reports stinging and burning bil feet. Right foot stinging radiates around the ankle. bil pain 10/10. bil pedal pulses strong. No relief with ice.   Pt ran out of medicine allopurinol 2 months ago.

## 2012-01-20 ENCOUNTER — Emergency Department (HOSPITAL_COMMUNITY)
Admission: EM | Admit: 2012-01-20 | Discharge: 2012-01-20 | Disposition: A | Discharge status: Home / Self Care | Payer: Medicaid Other | Attending: Emergency Medicine | Admitting: Emergency Medicine

## 2012-01-20 ENCOUNTER — Encounter (HOSPITAL_COMMUNITY): Payer: Self-pay | Admitting: *Deleted

## 2012-01-20 DIAGNOSIS — Z79899 Other long term (current) drug therapy: Secondary | ICD-10-CM | POA: Insufficient documentation

## 2012-01-20 DIAGNOSIS — M109 Gout, unspecified: Secondary | ICD-10-CM | POA: Insufficient documentation

## 2012-01-20 DIAGNOSIS — Z794 Long term (current) use of insulin: Secondary | ICD-10-CM | POA: Insufficient documentation

## 2012-01-20 DIAGNOSIS — E119 Type 2 diabetes mellitus without complications: Secondary | ICD-10-CM | POA: Insufficient documentation

## 2012-01-20 DIAGNOSIS — I10 Essential (primary) hypertension: Secondary | ICD-10-CM | POA: Insufficient documentation

## 2012-01-20 MED ORDER — OXYCODONE-ACETAMINOPHEN 5-325 MG PO TABS: 2.0000 | ORAL_TABLET | ORAL | Status: DC | PRN

## 2012-01-20 MED ORDER — OXYCODONE-ACETAMINOPHEN 5-325 MG PO TABS
2.0000 | ORAL_TABLET | Freq: Once | ORAL | Status: AC
  Administered 2012-01-20: 1 via ORAL
  Filled 2012-01-20: qty 2

## 2012-01-20 MED ORDER — ALLOPURINOL 300 MG PO TABS: 300.0000 mg | ORAL_TABLET | Freq: Every day | ORAL | Status: DC

## 2012-01-20 MED ORDER — PREDNISONE 20 MG PO TABS
60.0000 mg | ORAL_TABLET | Freq: Once | ORAL | Status: AC
  Administered 2012-01-20: 60 mg via ORAL
  Filled 2012-01-20: qty 3

## 2012-01-20 MED ORDER — PREDNISONE 10 MG PO TABS: 20.0000 mg | ORAL_TABLET | Freq: Every day | ORAL | Status: DC

## 2012-01-20 NOTE — ED Provider Notes (Signed)
History     CSN: [id number]  Arrival date & time 01/20/12  0607   First MD Initiated Contact with Patient 01/20/12 0622      Chief Complaint  Patient presents with  . Foot Pain    (Consider location/radiation/quality/duration/timing/severity/associated sxs/prior treatment) Patient is a 51 y.o. female presenting with lower extremity pain. The history is provided by the patient. No language interpreter was used.  Foot Pain This is a recurrent problem. The current episode started in the past 7 days. The problem occurs daily. Associated symptoms include arthralgias. Pertinent negatives include no fever, joint swelling, nausea, numbness or weakness. The symptoms are aggravated by walking. She has tried acetaminophen for the symptoms.   51 year old female coming in with her normal gout pain in her first metatarsal phalangeal joint to bilateral feet.  She is [facility] and is looking for a new provider and is out of her Zyloprim prescription. States that the pain has gotten worse over the last 2 days. The pain is worse with movement and better when she is still.  She has taken tylenol and aspirin at home with mild relief.    Past Medical History  Diagnosis Date  . Hypertension   . Diabetes mellitus   . Gout 12 days ago    History reviewed. No pertinent past surgical history.  No family history on file.  History  Substance Use Topics  . Smoking status: Never Smoker   . Smokeless tobacco: Not on file  . Alcohol Use: No    OB History    Grav Para Term Preterm Abortions TAB SAB Ect Mult Living                  Review of Systems  Constitutional: Negative.  Negative for fever.  HENT: Negative.   Eyes: Negative.   Respiratory: Negative.   Cardiovascular: Negative.   Gastrointestinal: Negative.  Negative for nausea.  Musculoskeletal: Positive for arthralgias. Negative for joint swelling and gait problem.       R and L great toe pain  Neurological: Negative.   Negative for weakness and numbness.  Psychiatric/Behavioral: Negative.   All other systems reviewed and are negative.    Allergies  Review of patient's allergies indicates no known allergies.  Home Medications   Current Outpatient Rx  Name  Route  Sig  Dispense  Refill  . ACETAMINOPHEN 500 MG PO TABS   Oral   Take 500 mg by mouth every 6 (six) hours as needed. pain         . ALBUTEROL SULFATE HFA 108 (90 BASE) MCG/ACT IN AERS   Inhalation   Inhale 2 puffs into the lungs every 6 (six) hours as needed. Wheezing and shortness of breath         . GLIPIZIDE 10 MG PO TABS   Oral   Take 10 mg by mouth daily.          Marland Kitchen HYDROCHLOROTHIAZIDE 25 MG PO TABS   Oral   Take 25 mg by mouth daily.         . INSULIN ASPART 100 UNIT/ML [place] SOLN   Subcutaneous   Inject 5 Units into the skin 3 (three) times daily before meals.          . INSULIN GLARGINE 100 UNIT/ML [place] SOLN   Subcutaneous   Inject 30 Units into the skin at bedtime.          Marland Kitchen METFORMIN HCL 500 MG PO TABS  Oral   Take 1,000 mg by mouth 2 (two) times daily with a meal.          . ALLOPURINOL 300 MG PO TABS   Oral   Take 1 tablet (300 mg total) by mouth daily.   30 tablet   0   . OXYCODONE-ACETAMINOPHEN 5-325 MG PO TABS   Oral   Take 2 tablets by mouth every 4 (four) hours as needed. For pain           BP 133/99  Pulse 82  Temp 98 F (36.7 C)  Resp 20  SpO2 99%  Physical Exam  Nursing note and vitals reviewed. Constitutional: She is oriented to person, place, and time. She appears well-developed and well-nourished.  HENT:  Head: Normocephalic and atraumatic.  Eyes: Conjunctivae normal and EOM are normal. Pupils are equal, round, and reactive to light.  Neck: Normal range of motion. Neck supple.  Cardiovascular: Normal rate.   Pulmonary/Chest: Effort normal.  Abdominal: Soft.  Musculoskeletal: Normal range of motion. She exhibits tenderness. She exhibits no edema.       Patient has  tenderness to right > left great toe. No swelling noted in skin is cool to touch.  Neurological: She is alert and oriented to person, place, and time. She has normal reflexes.  Skin: Skin is warm and dry.  Psychiatric: She has a normal mood and affect.    ED Course  Procedures (including critical care time)  Labs Reviewed - No data to display No results found.   No diagnosis found.    MDM  51yo female with typical gout pain and out of meds.  In between medical doctors.  No signs of infection or inflamation.  Pain meds given in er.  Rx for prednisone, percocet and zyloprim.          Remi Haggard, NP 01/20/12 [phone]

## 2012-01-20 NOTE — ED Notes (Signed)
Pt states out of allopurinol x 1 month; c/o severe gout pain in feet bilaterally; leg stiffness

## 2012-01-20 NOTE — ED Provider Notes (Signed)
  Medical screening examination/treatment/procedure(s) were performed by non-physician practitioner and as supervising physician I was immediately available for consultation/collaboration.    Chrystina Naff D Thorin Starner, MD 01/20/12 2350 

## 2012-04-01 ENCOUNTER — Emergency Department (HOSPITAL_COMMUNITY)
Admission: EM | Admit: 2012-04-01 | Discharge: 2012-04-01 | Disposition: A | Discharge status: Home / Self Care | Payer: Medicaid Other | Source: Home / Self Care

## 2012-04-01 ENCOUNTER — Encounter (HOSPITAL_COMMUNITY): Payer: Self-pay | Admitting: Emergency Medicine

## 2012-04-01 DIAGNOSIS — R21 Rash and other nonspecific skin eruption: Secondary | ICD-10-CM

## 2012-04-01 NOTE — ED Notes (Signed)
Pt c/o blistery rashes on legs, abdomen, and forearm. Pt states that she has used creams and alcohol with mild relief. Symptoms present x 2 days.

## 2012-05-14 NOTE — ED Provider Notes (Signed)
History     CSN: [id number]  Arrival date & time 04/01/12  1445   First MD Initiated Contact with Patient 04/01/12 1521      Chief Complaint  Patient presents with  . Herpes Zoster    x 2 days    (Consider location/radiation/quality/duration/timing/severity/associated sxs/prior treatment) HPI Rash noted on legs, abdomen and forearm for 2-3 days with mild itching.  Past Medical History  Diagnosis Date  . Hypertension   . Diabetes mellitus   . Gout 12 days ago    History reviewed. No pertinent past surgical history.  History reviewed. No pertinent family history.  History  Substance Use Topics  . Smoking status: Never Smoker   . Smokeless tobacco: Not on file  . Alcohol Use: No    OB History   Grav Para Term Preterm Abortions TAB SAB Ect Mult Living                  Review of Systems  Constitutional: Negative.   HENT: Negative.   Eyes: Negative.   Respiratory: Negative.   Cardiovascular: Negative.   Gastrointestinal: Negative.   Genitourinary: Negative.   Musculoskeletal: Negative.   Skin: Positive for rash.  Neurological: Negative.   Hematological: Negative.   Psychiatric/Behavioral: Negative.     Allergies  Review of patient's allergies indicates no known allergies.  Home Medications   Current Outpatient Rx  Name  Route  Sig  Dispense  Refill  . glipiZIDE (GLUCOTROL) 10 MG tablet   Oral   Take 10 mg by mouth daily.          . hydrochlorothiazide (HYDRODIURIL) 25 MG tablet   Oral   Take 25 mg by mouth daily.         . insulin aspart (NOVOLOG) 100 UNIT/ML injection   Subcutaneous   Inject 5 Units into the skin 3 (three) times daily before meals.          . insulin glargine (LANTUS) 100 UNIT/ML injection   Subcutaneous   Inject 30 Units into the skin at bedtime.          . metFORMIN (GLUCOPHAGE) 500 MG tablet   Oral   Take 1,000 mg by mouth 2 (two) times daily with a meal.          . acetaminophen (TYLENOL) 500 MG tablet  Oral   Take 500 mg by mouth every 6 (six) hours as needed. pain         . albuterol (PROVENTIL HFA;VENTOLIN HFA) 108 (90 BASE) MCG/ACT inhaler   Inhalation   Inhale 2 puffs into the lungs every 6 (six) hours as needed. Wheezing and shortness of breath         . allopurinol (ZYLOPRIM) 300 MG tablet   Oral   Take 1 tablet (300 mg total) by mouth daily.   30 tablet   0   . allopurinol (ZYLOPRIM) 300 MG tablet   Oral   Take 1 tablet (300 mg total) by mouth daily.   30 tablet   1   . oxyCODONE-acetaminophen (PERCOCET/ROXICET) 5-325 MG per tablet   Oral   Take 2 tablets by mouth every 4 (four) hours as needed for pain.   15 tablet   0   . predniSONE (DELTASONE) 10 MG tablet   Oral   Take 2 tablets (20 mg total) by mouth daily.   5 tablet   0     BP 130/95  Pulse 96  Temp(Src) 98.2 F (36.8 C) (Oral)  Resp 18  SpO2 100%  Physical Exam  Constitutional: She is oriented to person, place, and time. She appears well-developed and well-nourished.  HENT:  Head: Normocephalic and atraumatic.  Eyes: Conjunctivae are normal. Pupils are equal, round, and reactive to light.  Neck: Normal range of motion. Neck supple.  Cardiovascular: Normal rate and regular rhythm.   Pulmonary/Chest: Effort normal and breath sounds normal.  Abdominal: Soft. Bowel sounds are normal.  Musculoskeletal: Normal range of motion.  Neurological: She is alert and oriented to person, place, and time.  Skin:  Erythematous rash in patchy distribution along arms, on abdomen and legs    ED Course  Procedures (including critical care time)  Labs Reviewed - No data to display No results found.   1. Rash       MDM  Unknown rash- pt referred to [facility]        Calvert Cantor, MD 05/14/12 [phone]

## 2012-06-30 ENCOUNTER — Emergency Department (HOSPITAL_COMMUNITY)
Admission: EM | Admit: 2012-06-30 | Discharge: 2012-06-30 | Disposition: A | Discharge status: Home / Self Care | Payer: Medicaid Other | Attending: Emergency Medicine | Admitting: Emergency Medicine

## 2012-06-30 DIAGNOSIS — Z79899 Other long term (current) drug therapy: Secondary | ICD-10-CM | POA: Insufficient documentation

## 2012-06-30 DIAGNOSIS — E119 Type 2 diabetes mellitus without complications: Secondary | ICD-10-CM | POA: Insufficient documentation

## 2012-06-30 DIAGNOSIS — M109 Gout, unspecified: Secondary | ICD-10-CM

## 2012-06-30 DIAGNOSIS — I10 Essential (primary) hypertension: Secondary | ICD-10-CM | POA: Insufficient documentation

## 2012-06-30 DIAGNOSIS — IMO0002 Reserved for concepts with insufficient information to code with codable children: Secondary | ICD-10-CM | POA: Insufficient documentation

## 2012-06-30 DIAGNOSIS — Z794 Long term (current) use of insulin: Secondary | ICD-10-CM | POA: Insufficient documentation

## 2012-06-30 MED ORDER — PERCOCET 5-325 MG PO TABS: 1.0000 | ORAL_TABLET | Freq: Four times a day (QID) | ORAL | Status: DC | PRN

## 2012-06-30 MED ORDER — ALLOPURINOL 300 MG PO TABS: 300.0000 mg | ORAL_TABLET | Freq: Every day | ORAL | Status: DC

## 2012-06-30 MED ORDER — INDOMETHACIN 25 MG PO CAPS: 25.0000 mg | ORAL_CAPSULE | Freq: Three times a day (TID) | ORAL | Status: DC

## 2012-06-30 MED ORDER — OXYCODONE-ACETAMINOPHEN 5-325 MG PO TABS
1.0000 | ORAL_TABLET | Freq: Once | ORAL | Status: AC
  Administered 2012-06-30: 1 via ORAL
  Filled 2012-06-30: qty 1

## 2012-06-30 NOTE — ED Notes (Signed)
Pt c/o R foot stinging and burning since yesterday morning. Pt states she has R great toe pain. Pt reports that she has a hx of gout. Pt arrives in wheelchair. States pain is much worse with walking. Pt arrives with companion.

## 2012-06-30 NOTE — ED Provider Notes (Deleted)
History    This chart was scribed for Ebbie Ridge PA-C, a non-physician practitioner working with Celene Kras, MD by Lewanda Rife, ED Scribe. This patient was seen in room WTR9/WTR9 and the patient's care was started at 1647.     CSN: [id number]  Arrival date & time 06/30/12  1507   First MD Initiated Contact with Patient 06/30/12 1639      Chief Complaint  Patient presents with  . Foot Pain    (Consider location/radiation/quality/duration/timing/severity/associated sxs/prior treatment) The history is provided by the patient.   HPI Comments: Crystal Boyer is a 52 y.o. female who presents to the Emergency Department complaining of constant moderate right greater toe pain without radiation onset yesterday morning. Described pain feeling like "a bee sting." Denies recent injury. Reports pain is aggravated with walking and alleviated by nothing. Reports trying OTC pain medications with no relief of symptoms. Reports hx of gout and diabetes.  Past Medical History  Diagnosis Date  . Hypertension   . Diabetes mellitus   . Gout 12 days ago    No past surgical history on file.  No family history on file.  History  Substance Use Topics  . Smoking status: Never Smoker   . Smokeless tobacco: Not on file  . Alcohol Use: No    OB History   Grav Para Term Preterm Abortions TAB SAB Ect Mult Living                  Review of Systems  Musculoskeletal:       Right toe pain  All other systems reviewed and are negative.   A complete 10 system review of systems was obtained and all systems are negative except as noted in the HPI and PMH.    Allergies  Review of patient's allergies indicates no known allergies.  Home Medications   Current Outpatient Rx  Name  Route  Sig  Dispense  Refill  . acetaminophen (TYLENOL) 500 MG tablet   Oral   Take 500 mg by mouth every 6 (six) hours as needed. pain         . albuterol (PROVENTIL HFA;VENTOLIN HFA) 108 (90 BASE)  MCG/ACT inhaler   Inhalation   Inhale 2 puffs into the lungs every 6 (six) hours as needed. Wheezing and shortness of breath         . allopurinol (ZYLOPRIM) 300 MG tablet   Oral   Take 1 tablet (300 mg total) by mouth daily.   30 tablet   0   . allopurinol (ZYLOPRIM) 300 MG tablet   Oral   Take 1 tablet (300 mg total) by mouth daily.   30 tablet   1   . glipiZIDE (GLUCOTROL) 10 MG tablet   Oral   Take 10 mg by mouth daily.          . hydrochlorothiazide (HYDRODIURIL) 25 MG tablet   Oral   Take 25 mg by mouth daily.         . insulin aspart (NOVOLOG) 100 UNIT/ML injection   Subcutaneous   Inject 5 Units into the skin 3 (three) times daily before meals.          . insulin glargine (LANTUS) 100 UNIT/ML injection   Subcutaneous   Inject 30 Units into the skin at bedtime.          . metFORMIN (GLUCOPHAGE) 500 MG tablet   Oral   Take 1,000 mg by mouth 2 (two) times daily with a  meal.          . oxyCODONE-acetaminophen (PERCOCET/ROXICET) 5-325 MG per tablet   Oral   Take 2 tablets by mouth every 4 (four) hours as needed for pain.   15 tablet   0   . predniSONE (DELTASONE) 10 MG tablet   Oral   Take 2 tablets (20 mg total) by mouth daily.   5 tablet   0     BP 134/99  Pulse 83  Temp(Src) 98.6 F (37 C) (Oral)  Resp 20  SpO2 100%  Physical Exam  Nursing note and vitals reviewed. Constitutional: She is oriented to person, place, and time. She appears well-developed and well-nourished. No distress.  HENT:  Head: Normocephalic and atraumatic.  Eyes: EOM are normal.  Neck: Neck supple. No tracheal deviation present.  Cardiovascular: Normal rate, intact distal pulses and normal pulses.   Pulmonary/Chest: Effort normal. No respiratory distress.  Musculoskeletal: Normal range of motion.  Mild swelling of right great toe and tenderness over PIP joint   Neurological: She is alert and oriented to person, place, and time.  Skin: Skin is warm and dry.   Psychiatric: She has a normal mood and affect. Her behavior is normal.    ED Course  Procedures (including critical care time) Medications - No data to display  Patient states that this feels like her previous episodes of gout. Patient will be treated as such. Told to follow up with her PCP.    MDM  I personally performed the services described in this documentation, which was scribed in my presence. The recorded information has been reviewed and is accurate.   Carlyle Dolly, PA-C 07/09/12 0625  Carlyle Dolly, PA-C 07/09/12 1610  Carlyle Dolly, PA-C 07/09/12 1336  Carlyle Dolly, PA-C 07/15/12 1527

## 2012-07-09 NOTE — ED Provider Notes (Signed)
Medical screening examination/treatment/procedure(s) were performed by non-physician practitioner and as supervising physician I was immediately available for consultation/collaboration.   Celene Kras, MD 07/09/12 1134

## 2012-07-10 NOTE — ED Provider Notes (Signed)
Medical screening examination/treatment/procedure(s) were performed by non-physician practitioner and as supervising physician I was immediately available for consultation/collaboration.   Celene Kras, MD 07/10/12 [phone]

## 2012-12-22 ENCOUNTER — Emergency Department (HOSPITAL_COMMUNITY)
Admission: EM | Admit: 2012-12-22 | Discharge: 2012-12-22 | Disposition: A | Discharge status: Home / Self Care | Payer: Medicaid Other | Attending: Emergency Medicine | Admitting: Emergency Medicine

## 2012-12-22 ENCOUNTER — Encounter (HOSPITAL_COMMUNITY): Payer: Self-pay | Admitting: Emergency Medicine

## 2012-12-22 DIAGNOSIS — M79609 Pain in unspecified limb: Secondary | ICD-10-CM | POA: Insufficient documentation

## 2012-12-22 DIAGNOSIS — Z79899 Other long term (current) drug therapy: Secondary | ICD-10-CM | POA: Insufficient documentation

## 2012-12-22 DIAGNOSIS — B9689 Other specified bacterial agents as the cause of diseases classified elsewhere: Secondary | ICD-10-CM | POA: Insufficient documentation

## 2012-12-22 DIAGNOSIS — Z794 Long term (current) use of insulin: Secondary | ICD-10-CM | POA: Insufficient documentation

## 2012-12-22 DIAGNOSIS — E119 Type 2 diabetes mellitus without complications: Secondary | ICD-10-CM | POA: Insufficient documentation

## 2012-12-22 DIAGNOSIS — N760 Acute vaginitis: Secondary | ICD-10-CM

## 2012-12-22 DIAGNOSIS — Z90710 Acquired absence of both cervix and uterus: Secondary | ICD-10-CM | POA: Insufficient documentation

## 2012-12-22 DIAGNOSIS — Z9079 Acquired absence of other genital organ(s): Secondary | ICD-10-CM | POA: Insufficient documentation

## 2012-12-22 DIAGNOSIS — A499 Bacterial infection, unspecified: Secondary | ICD-10-CM | POA: Insufficient documentation

## 2012-12-22 DIAGNOSIS — M109 Gout, unspecified: Secondary | ICD-10-CM | POA: Insufficient documentation

## 2012-12-22 DIAGNOSIS — I10 Essential (primary) hypertension: Secondary | ICD-10-CM | POA: Insufficient documentation

## 2012-12-22 LAB — URINALYSIS, ROUTINE W REFLEX MICROSCOPIC
Bilirubin Urine: NEGATIVE
Hgb urine dipstick: NEGATIVE
Ketones, ur: NEGATIVE mg/dL
Nitrite: NEGATIVE
Urobilinogen, UA: 0.2 mg/dL (ref 0.0–1.0)
pH: 5.5 (ref 5.0–8.0)

## 2012-12-22 LAB — WET PREP, GENITAL
Clue Cells Wet Prep HPF POC: NONE SEEN
Trich, Wet Prep: NONE SEEN

## 2012-12-22 LAB — URINE MICROSCOPIC-ADD ON

## 2012-12-22 MED ORDER — AZITHROMYCIN 1 G PO PACK
1.0000 g | PACK | Freq: Once | ORAL | Status: AC
  Administered 2012-12-22: 1 g via ORAL
  Filled 2012-12-22: qty 1

## 2012-12-22 MED ORDER — NITROFURANTOIN MONOHYD MACRO 100 MG PO CAPS: 100.0000 mg | ORAL_CAPSULE | Freq: Two times a day (BID) | ORAL | Status: DC

## 2012-12-22 MED ORDER — CEFTRIAXONE SODIUM 250 MG IJ SOLR
250.0000 mg | Freq: Once | INTRAMUSCULAR | Status: AC
Start: 2012-12-22 — End: 2012-12-22
  Administered 2012-12-22: 250 mg via INTRAMUSCULAR
  Filled 2012-12-22: qty 250

## 2012-12-22 MED ORDER — IBUPROFEN 800 MG PO TABS
800.0000 mg | ORAL_TABLET | Freq: Once | ORAL | Status: AC
  Administered 2012-12-22: 800 mg via ORAL
  Filled 2012-12-22: qty 1

## 2012-12-22 MED ORDER — LIDOCAINE HCL 1 % IJ SOLN
INTRAMUSCULAR | Status: AC
  Filled 2012-12-22: qty 20

## 2012-12-22 NOTE — ED Provider Notes (Signed)
CSN: [id number]     Arrival date & time 12/22/12  1242 History   First MD Initiated Contact with Patient 12/22/12 1256     Chief Complaint  Patient presents with  . Vaginal Itching  . Urinary Tract Infection  . Toe Pain   (Consider location/radiation/quality/duration/timing/severity/associated sxs/prior Treatment) Patient is a 52 y.o. female presenting with vaginal itching. The history is provided by the patient. No language interpreter was used.  Vaginal Itching This is a new problem. The current episode started today. Associated symptoms include urinary symptoms. Pertinent negatives include no abdominal pain, chills, fever, nausea, rash, vomiting or weakness.  pt is a 52 year old female who presents with c/o dysuria and burning upon urination and vaginal itching. She denies recent illness, fever, chills, nausea or vomiting. She reports that she has been with the same sexual partner for the last 7 years however, she doesn't know if he has had other partners, she reports that they do not live together. She feels really irritated in her perineum and vaginal area and reports that she gets some relief from hot water showers or baths.   Past Medical History  Diagnosis Date  . Hypertension   . Diabetes mellitus   . Gout 12 days ago   History reviewed. No pertinent past surgical history. No family history on file. History  Substance Use Topics  . Smoking status: Never Smoker   . Smokeless tobacco: Not on file  . Alcohol Use: No   OB History   Grav Para Term Preterm Abortions TAB SAB Ect Mult Living                 Review of Systems  Constitutional: Negative for fever and chills.  Gastrointestinal: Negative for nausea, vomiting and abdominal pain.  Genitourinary: Positive for dysuria and urgency. Negative for vaginal bleeding and genital sores.  Skin: Negative for rash.  Neurological: Negative for weakness.  All other systems reviewed and are negative.    Allergies  Review of  patient's allergies indicates no known allergies.  Home Medications   Current Outpatient Rx  Name  Route  Sig  Dispense  Refill  . albuterol (PROVENTIL HFA;VENTOLIN HFA) 108 (90 BASE) MCG/ACT inhaler   Inhalation   Inhale 2 puffs into the lungs every 6 (six) hours as needed. Wheezing and shortness of breath         . allopurinol (ZYLOPRIM) 300 MG tablet   Oral   Take 1 tablet (300 mg total) by mouth daily.   30 tablet   0   . allopurinol (ZYLOPRIM) 300 MG tablet   Oral   Take 300 mg by mouth every morning.         . fish oil-omega-3 fatty acids 1000 MG capsule   Oral   Take 1 g by mouth daily.         Marland Kitchen glipiZIDE (GLUCOTROL) 10 MG tablet   Oral   Take 10 mg by mouth daily.          . indomethacin (INDOCIN) 25 MG capsule   Oral   Take 1 capsule (25 mg total) by mouth 3 (three) times daily with meals.   30 capsule   0   . insulin aspart (NOVOLOG) 100 UNIT/ML injection   Subcutaneous   Inject 5 Units into the skin 3 (three) times daily before meals.          . insulin glargine (LANTUS) 100 UNIT/ML injection   Subcutaneous   Inject 30 Units into  the skin at bedtime.          Marland Kitchen losartan (COZAAR) 25 MG tablet   Oral   Take 25 mg by mouth daily.         . metFORMIN (GLUCOPHAGE) 500 MG tablet   Oral   Take 1,000 mg by mouth 2 (two) times daily with a meal.          . PERCOCET 5-325 MG per tablet   Oral   Take 1 tablet by mouth every 6 (six) hours as needed for pain.   15 tablet   0     Dispense as written.    BP 154/100  Pulse 93  Temp(Src) 97.7 F (36.5 C) (Oral)  Resp 20  Wt 235 lb (106.595 kg)  SpO2 100% Physical Exam  Nursing note and vitals reviewed. Constitutional: She is oriented to person, place, and time. She appears well-developed and well-nourished. No distress.  Well-appearing   HENT:  Head: Normocephalic and atraumatic.  Mouth/Throat: Oropharynx is clear and moist.  Eyes: Conjunctivae and EOM are normal. Pupils are equal,  round, and reactive to light.  Neck: Normal range of motion. Neck supple. No JVD present. No tracheal deviation present. No thyromegaly present.  Cardiovascular: Normal rate, regular rhythm and normal heart sounds.   Pulmonary/Chest: Effort normal and breath sounds normal. No respiratory distress. She has no wheezes.  Abdominal: Soft. Bowel sounds are normal. She exhibits no distension. There is no tenderness.  Genitourinary: There is no rash, tenderness, lesion or injury on the right labia. There is no rash, tenderness, lesion or injury on the left labia. Right adnexum displays no mass. Left adnexum displays no mass. There is erythema and tenderness around the vagina. No signs of injury around the vagina.  Pt reports hysterectomy and bilateral salpingoophorectomy.   Musculoskeletal: Normal range of motion.  Lymphadenopathy:    She has no cervical adenopathy.  Neurological: She is alert and oriented to person, place, and time.  Skin: Skin is warm and dry.  Psychiatric: She has a normal mood and affect. Her behavior is normal. Judgment and thought content normal.    ED Course  Procedures (including critical care time) Labs Review Labs Reviewed - No data to display Imaging Review No results found.  EKG Interpretation   None       MDM   1. Vaginitis     Vaginal itching, irritation and whitish discharge. Pt suspicious of her partners activity and worried about an STD. Azithro 1gm PO and Ceftriaxone 250mg  IM here during visit. Macrobid for UTI symptomatology. Return to PCP or here for follow-up if no gradual improvement of symptoms over the next 2-3 days.        Irish Elders, NP 12/22/12 [phone]

## 2012-12-22 NOTE — ED Notes (Signed)
pateint states that she had had burning with urination and burning in her vaginal area x 1 month.

## 2012-12-22 NOTE — ED Notes (Signed)
Patient has tried Technical brewer guard without relief.

## 2012-12-23 LAB — URINE CULTURE

## 2012-12-23 NOTE — ED Provider Notes (Signed)
Medical screening examination/treatment/procedure(s) were performed by non-physician practitioner and as supervising physician I was immediately available for consultation/collaboration.  EKG Interpretation   None        Raeford Razor, MD 12/23/12 [phone]

## 2012-12-24 LAB — GC/CHLAMYDIA PROBE AMP
CT Probe RNA: NEGATIVE
GC Probe RNA: NEGATIVE

## 2012-12-25 ENCOUNTER — Emergency Department (HOSPITAL_COMMUNITY): Payer: Medicaid Other

## 2012-12-25 ENCOUNTER — Emergency Department (HOSPITAL_COMMUNITY)
Admission: EM | Admit: 2012-12-25 | Discharge: 2012-12-25 | Disposition: A | Discharge status: Home / Self Care | Payer: Medicaid Other | Attending: Emergency Medicine | Admitting: Emergency Medicine

## 2012-12-25 ENCOUNTER — Encounter (HOSPITAL_COMMUNITY): Payer: Self-pay | Admitting: Emergency Medicine

## 2012-12-25 DIAGNOSIS — N760 Acute vaginitis: Secondary | ICD-10-CM | POA: Insufficient documentation

## 2012-12-25 DIAGNOSIS — Z794 Long term (current) use of insulin: Secondary | ICD-10-CM | POA: Insufficient documentation

## 2012-12-25 DIAGNOSIS — R509 Fever, unspecified: Secondary | ICD-10-CM | POA: Insufficient documentation

## 2012-12-25 DIAGNOSIS — A499 Bacterial infection, unspecified: Secondary | ICD-10-CM | POA: Insufficient documentation

## 2012-12-25 DIAGNOSIS — B3731 Acute candidiasis of vulva and vagina: Secondary | ICD-10-CM | POA: Insufficient documentation

## 2012-12-25 DIAGNOSIS — Z90710 Acquired absence of both cervix and uterus: Secondary | ICD-10-CM | POA: Insufficient documentation

## 2012-12-25 DIAGNOSIS — M109 Gout, unspecified: Secondary | ICD-10-CM | POA: Insufficient documentation

## 2012-12-25 DIAGNOSIS — R110 Nausea: Secondary | ICD-10-CM | POA: Insufficient documentation

## 2012-12-25 DIAGNOSIS — N390 Urinary tract infection, site not specified: Secondary | ICD-10-CM | POA: Insufficient documentation

## 2012-12-25 DIAGNOSIS — R1031 Right lower quadrant pain: Secondary | ICD-10-CM | POA: Insufficient documentation

## 2012-12-25 DIAGNOSIS — R1032 Left lower quadrant pain: Secondary | ICD-10-CM | POA: Insufficient documentation

## 2012-12-25 DIAGNOSIS — B373 Candidiasis of vulva and vagina: Secondary | ICD-10-CM

## 2012-12-25 DIAGNOSIS — Z79899 Other long term (current) drug therapy: Secondary | ICD-10-CM | POA: Insufficient documentation

## 2012-12-25 DIAGNOSIS — E119 Type 2 diabetes mellitus without complications: Secondary | ICD-10-CM | POA: Insufficient documentation

## 2012-12-25 DIAGNOSIS — I10 Essential (primary) hypertension: Secondary | ICD-10-CM | POA: Insufficient documentation

## 2012-12-25 DIAGNOSIS — R739 Hyperglycemia, unspecified: Secondary | ICD-10-CM

## 2012-12-25 DIAGNOSIS — B9689 Other specified bacterial agents as the cause of diseases classified elsewhere: Secondary | ICD-10-CM | POA: Insufficient documentation

## 2012-12-25 LAB — COMPREHENSIVE METABOLIC PANEL
ALT: 37 U/L — ABNORMAL HIGH (ref 0–35)
Albumin: 3.6 g/dL (ref 3.5–5.2)
Alkaline Phosphatase: 155 U/L — ABNORMAL HIGH (ref 39–117)
BUN: 10 mg/dL (ref 6–23)
CO2: 23 mEq/L (ref 19–32)
GFR calc Af Amer: >90 mL/min (ref >90)
GFR calc non Af Amer: 85 mL/min — ABNORMAL LOW (ref >90)
Glucose, Bld: 255 mg/dL — ABNORMAL HIGH (ref 70–99)
Potassium: 3.9 mEq/L (ref 3.5–5.1)
Sodium: 134 mEq/L — ABNORMAL LOW (ref 135–145)
Total Protein: 8.1 g/dL (ref 6.0–8.3)

## 2012-12-25 LAB — URINALYSIS, ROUTINE W REFLEX MICROSCOPIC
Bilirubin Urine: NEGATIVE
Nitrite: NEGATIVE
Specific Gravity, Urine: 1.032 — ABNORMAL HIGH (ref 1.005–1.030)
Urobilinogen, UA: 0.2 mg/dL (ref 0.0–1.0)
pH: 5.5 (ref 5.0–8.0)

## 2012-12-25 LAB — WET PREP, GENITAL: Trich, Wet Prep: NONE SEEN

## 2012-12-25 LAB — URINE MICROSCOPIC-ADD ON

## 2012-12-25 LAB — CBC WITH DIFFERENTIAL/PLATELET
Basophils Relative: 1 % (ref 0–1)
Eosinophils Absolute: 0.2 10*3/uL (ref 0.0–0.7)
Eosinophils Relative: 3 % (ref 0–5)
Hemoglobin: 14.2 g/dL (ref 12.0–15.0)
Lymphocytes Relative: 34 % (ref 12–46)
Lymphs Abs: 2.2 10*3/uL (ref 0.7–4.0)
MCH: 29.6 pg (ref 26.0–34.0)
MCV: 85.4 fL (ref 78.0–100.0)
Monocytes Relative: 5 % (ref 3–12)
Neutrophils Relative %: 57 % (ref 43–77)
Platelets: 195 10*3/uL (ref 150–400)
RBC: 4.8 MIL/uL (ref 3.87–5.11)
WBC: 6.3 10*3/uL (ref 4.0–10.5)

## 2012-12-25 MED ORDER — FLUCONAZOLE 200 MG PO TABS: 200.0000 mg | ORAL_TABLET | Freq: Every day | ORAL | Status: AC

## 2012-12-25 MED ORDER — IOHEXOL 300 MG/ML  SOLN
100.0000 mL | Freq: Once | INTRAMUSCULAR | Status: AC | PRN
  Administered 2012-12-25: 100 mL via INTRAVENOUS

## 2012-12-25 MED ORDER — HYDROCODONE-ACETAMINOPHEN 5-325 MG PO TABS
1.0000 | ORAL_TABLET | Freq: Once | ORAL | Status: AC
  Administered 2012-12-25: 1 via ORAL
  Filled 2012-12-25: qty 1

## 2012-12-25 MED ORDER — METRONIDAZOLE 500 MG PO TABS: 500.0000 mg | ORAL_TABLET | Freq: Two times a day (BID) | ORAL | Status: DC

## 2012-12-25 MED ORDER — MORPHINE SULFATE 2 MG/ML IJ SOLN
2.0000 mg | Freq: Once | INTRAMUSCULAR | Status: DC
  Filled 2012-12-25: qty 1

## 2012-12-25 MED ORDER — ONDANSETRON 4 MG PO TBDP
4.0000 mg | ORAL_TABLET | Freq: Once | ORAL | Status: AC
  Administered 2012-12-25: 4 mg via ORAL
  Filled 2012-12-25: qty 1

## 2012-12-25 MED ORDER — IOHEXOL 300 MG/ML  SOLN
50.0000 mL | Freq: Once | INTRAMUSCULAR | Status: AC | PRN
  Administered 2012-12-25: 50 mL via ORAL

## 2012-12-25 MED ORDER — SODIUM CHLORIDE 0.9 % IV SOLN: INTRAVENOUS | Status: DC

## 2012-12-25 NOTE — ED Notes (Addendum)
Patient reports to ED for dysurea with burning and itching, was seen at ED on 12/22/12 and got medication for yeast infection. Pt reports that medication made her nauseous and made the burning stop for about one day, after which burning returned. Patient requests a different dose of medicine. Pt denies discharge.

## 2012-12-25 NOTE — ED Notes (Signed)
Patient transported to CT 

## 2012-12-25 NOTE — ED Provider Notes (Signed)
Reports right lower quadrant pain for the past 2 days with nausea but no vomiting. She states she has a normal appetite but is having diarrhea. She is unsure she's having fever but state "I feel hot". She states the pain is worse with movement. She has been on antibiotics since the 15th for UTI and reports she still has dysuria and burning when she pees.  Patient is noted to have some tenderness in the right lower quarter. There is no guarding or rebound.  Medical screening examination/treatment/procedure(s) were conducted as a shared visit with non-physician practitioner(s) and myself.  I personally evaluated the patient during the encounter.  EKG Interpretation   None        . Devoria Albe, MD, Armando Gang   Ward Givens, MD 12/25/12 [phone]

## 2012-12-25 NOTE — ED Provider Notes (Signed)
CSN: [id number]     Arrival date & time 12/25/12  5784 History   First MD Initiated Contact with Patient 12/25/12 1049     Chief Complaint  Patient presents with  . Dysuria   (Consider location/radiation/quality/duration/timing/severity/associated sxs/prior Treatment) HPI Comments: The patient is a 52 year old female s/p hysterectomy presenting to the ED with Dysuria for 2 days.  She reports she was recently seen and evaluated in the ED on 12/22/2012.  She reports she was treated for a UTI and has been compliant with her medication.  She reports one day of improvement and then an increase in external burnig and pain.    Patient is a 52 y.o. female presenting with dysuria. The history is provided by the patient. No language interpreter was used.  Dysuria Pain quality:  Burning and sharp Pain severity:  Moderate Onset quality:  Sudden Duration:  2 days Progression:  Worsening Chronicity:  Recurrent Recent UTI: was treated for a UTI 12/22/12.   Relieved by:  Nothing Worsened by:  Nothing tried Ineffective treatments:  Antibiotics Urinary symptoms: discolored urine, foul-smelling urine and frequent urination   Urinary symptoms: no hematuria and no bladder incontinence   Associated symptoms: abdominal pain, fever, nausea and vaginal discharge   Associated symptoms: no flank pain, no genital lesions and no vomiting   Abdominal pain:    Location:  LLQ and RLQ   Pain quality: "pulling"   Severity:  Mild   Onset quality:  Gradual   Duration:  2 days   Timing:  Constant   Progression:  Unable to specify   Chronicity:  New Fever:    Temp source:  Subjective Vaginal discharge:    Quality:  Thick, malodorous and white (Reports using OTC creams and a "60-day yeast pill")   Severity:  Moderate   Onset quality:  Gradual   Duration:  2 days   Timing:  Constant   Progression:  Worsening   Chronicity:  New Risk factors comment:  Recent Antibiotic use   Past Medical History  Diagnosis  Date  . Hypertension   . Diabetes mellitus   . Gout 12 days ago   History reviewed. No pertinent past surgical history. History reviewed. No pertinent family history. History  Substance Use Topics  . Smoking status: Never Smoker   . Smokeless tobacco: Not on file  . Alcohol Use: No   OB History   Grav Para Term Preterm Abortions TAB SAB Ect Mult Living                 Review of Systems  Constitutional: Positive for fever.  Gastrointestinal: Positive for nausea and abdominal pain. Negative for vomiting.  Genitourinary: Positive for dysuria and vaginal discharge. Negative for flank pain.    Allergies  Review of patient's allergies indicates no known allergies.  Home Medications   Current Outpatient Rx  Name  Route  Sig  Dispense  Refill  . albuterol (PROVENTIL HFA;VENTOLIN HFA) 108 (90 BASE) MCG/ACT inhaler   Inhalation   Inhale 2 puffs into the lungs every 6 (six) hours as needed. Wheezing and shortness of breath         . allopurinol (ZYLOPRIM) 100 MG tablet   Oral   Take 100 mg by mouth daily.         Marland Kitchen glipiZIDE (GLUCOTROL XL) 2.5 MG 24 hr tablet   Oral   Take 2.5 mg by mouth daily with breakfast.         . insulin aspart (  NOVOLOG) 100 UNIT/ML injection   Subcutaneous   Inject 5 Units into the skin 3 (three) times daily before meals.          . insulin glargine (LANTUS) 100 UNIT/ML injection   Subcutaneous   Inject 30 Units into the skin at bedtime.          Marland Kitchen losartan (COZAAR) 100 MG tablet   Oral   Take 100 mg by mouth daily.         . metFORMIN (GLUCOPHAGE) 1000 MG tablet   Oral   Take 1,000 mg by mouth 2 (two) times daily with a meal.         . nitrofurantoin, macrocrystal-monohydrate, (MACROBID) 100 MG capsule   Oral   Take 1 capsule (100 mg total) by mouth 2 (two) times daily.   10 capsule   0    BP 111/95  Pulse 83  Temp(Src) 97.7 F (36.5 C) (Oral)  Resp 18  SpO2 96% Physical Exam  Nursing note and vitals  reviewed. Constitutional: She appears well-developed and well-nourished. No distress.  HENT:  Head: Normocephalic and atraumatic.  Eyes: No scleral icterus.  Neck: Neck supple.  Cardiovascular: Normal rate and regular rhythm.   No murmur heard. Pulmonary/Chest: Effort normal and breath sounds normal. No respiratory distress. She has no wheezes. She has no rales.  Abdominal: Soft. Normal appearance and bowel sounds are normal. She exhibits no distension. There is tenderness in the right lower quadrant. There is no guarding, no CVA tenderness, no tenderness at McBurney's point and negative Murphy's sign.  Genitourinary: Vaginal discharge found.  Right pelvic pain with bimanual exam. Moderate amount of white curd-like discharge from the vagina.  No external lesions identified.     ED Course  Procedures (including critical care time) Labs Review Labs Reviewed  WET PREP, GENITAL - Abnormal; Notable for the following:    Yeast Wet Prep HPF POC MANY (*)    Clue Cells Wet Prep HPF POC MANY (*)    WBC, Wet Prep HPF POC FEW (*)    All other components within normal limits  URINALYSIS, ROUTINE W REFLEX MICROSCOPIC - Abnormal; Notable for the following:    Color, Urine AMBER (*)    APPearance CLOUDY (*)    Specific Gravity, Urine 1.032 (*)    Glucose, UA 500 (*)    Ketones, ur 15 (*)    Leukocytes, UA MODERATE (*)    All other components within normal limits  URINE MICROSCOPIC-ADD ON - Abnormal; Notable for the following:    Squamous Epithelial / LPF MANY (*)    Bacteria, UA MANY (*)    All other components within normal limits  CBC WITH DIFFERENTIAL  COMPREHENSIVE METABOLIC PANEL   Imaging Review No results found.  EKG Interpretation   None       MDM   1. Yeast vaginitis   2. BV (bacterial vaginosis)   3. Hyperglycemia    Patient was recently evaluated in the Ed and treated for a UTI.  Labs and urine sent, pelvic ordered with wet prep. Pelvic with Right sided pelvic pain  and a moderate amount of discharge noted in the vagina.  Concerning pelvic pain post hysterectomy.   Discussed the patient history and condition with Dr. Lynelle Doctor.   After Dr. Delford Field evaluation of the patient a CT was ordered. 1430 patient is complaining of RLQ pain "pulling". Pain medication given. CT-without obvious etiology of Right pelvic pain. Wet prep-clue cells and yeast.  Discussed lab results,  imaging results, and treatment plan with the patient.  She reports understanding and no other concerns at this time.   Patient is stable for discharge at this time.  Meds given in ED:  Medications  ondansetron (ZOFRAN-ODT) disintegrating tablet 4 mg (4 mg Oral Given 12/25/12 1247)  HYDROcodone-acetaminophen (NORCO/VICODIN) 5-325 MG per tablet 1 tablet (1 tablet Oral Given 12/25/12 1247)  iohexol (OMNIPAQUE) 300 MG/ML solution 50 mL (50 mLs Oral Contrast Given 12/25/12 1329)  iohexol (OMNIPAQUE) 300 MG/ML solution 100 mL (100 mLs Intravenous Contrast Given 12/25/12 1435)    Discharge Medication List as of 12/25/2012  3:29 PM    START taking these medications   Details  fluconazole (DIFLUCAN) 200 MG tablet Take 1 tablet (200 mg total) by mouth daily., Starting 12/25/2012, Last dose on Tue 01/01/13, Print    metroNIDAZOLE (FLAGYL) 500 MG tablet Take 1 tablet (500 mg total) by mouth 2 (two) times daily., Starting 12/25/2012, Until Discontinued, Print           Clabe Seal, PA-C 12/27/12 1426

## 2012-12-27 ENCOUNTER — Other Ambulatory Visit: Payer: Self-pay | Admitting: Family Medicine

## 2012-12-28 NOTE — ED Provider Notes (Signed)
See prior note   Ward Givens, MD 12/28/12 [phone]

## 2013-01-15 ENCOUNTER — Other Ambulatory Visit: Payer: Self-pay | Admitting: Family Medicine

## 2013-02-04 ENCOUNTER — Encounter (HOSPITAL_COMMUNITY): Payer: Self-pay | Admitting: Emergency Medicine

## 2013-02-04 ENCOUNTER — Emergency Department (HOSPITAL_COMMUNITY)
Admission: EM | Admit: 2013-02-04 | Discharge: 2013-02-04 | Disposition: A | Discharge status: Home / Self Care | Payer: Medicaid Other | Attending: Emergency Medicine | Admitting: Emergency Medicine

## 2013-02-04 DIAGNOSIS — M542 Cervicalgia: Secondary | ICD-10-CM | POA: Insufficient documentation

## 2013-02-04 DIAGNOSIS — E119 Type 2 diabetes mellitus without complications: Secondary | ICD-10-CM | POA: Insufficient documentation

## 2013-02-04 DIAGNOSIS — H9209 Otalgia, unspecified ear: Secondary | ICD-10-CM | POA: Insufficient documentation

## 2013-02-04 DIAGNOSIS — R509 Fever, unspecified: Secondary | ICD-10-CM | POA: Insufficient documentation

## 2013-02-04 DIAGNOSIS — I10 Essential (primary) hypertension: Secondary | ICD-10-CM | POA: Insufficient documentation

## 2013-02-04 DIAGNOSIS — Z79899 Other long term (current) drug therapy: Secondary | ICD-10-CM | POA: Insufficient documentation

## 2013-02-04 DIAGNOSIS — H9202 Otalgia, left ear: Secondary | ICD-10-CM

## 2013-02-04 DIAGNOSIS — Z794 Long term (current) use of insulin: Secondary | ICD-10-CM | POA: Insufficient documentation

## 2013-02-04 DIAGNOSIS — J029 Acute pharyngitis, unspecified: Secondary | ICD-10-CM | POA: Insufficient documentation

## 2013-02-04 MED ORDER — HYDROCODONE-ACETAMINOPHEN 7.5-325 MG/15ML PO SOLN: 15.0000 mL | Freq: Three times a day (TID) | ORAL | Status: DC | PRN

## 2013-02-04 NOTE — ED Provider Notes (Signed)
CSN: [id number]     Arrival date & time 02/04/13  0154 History   First MD Initiated Contact with Patient 02/04/13 [phone]     Chief Complaint  Patient presents with  . Sore Throat  . Otalgia   (Consider location/radiation/quality/duration/timing/severity/associated sxs/prior Treatment) Patient is a 52 y.o. female presenting with pharyngitis and ear pain. The history is provided by the patient. No language interpreter was used.  Sore Throat This is a new problem. Episode onset: 3 days ago. The problem occurs constantly. The problem has been gradually worsening. Associated symptoms include a fever (Tmax 101F), neck pain (anterior associated with lymphadenopathy), a sore throat and swollen glands. Pertinent negatives include no coughing or vomiting. The symptoms are aggravated by swallowing. She has tried nothing for the symptoms.  Otalgia Associated symptoms: fever (Tmax 101F), neck pain (anterior associated with lymphadenopathy) and sore throat   Associated symptoms: no cough and no vomiting     Past Medical History  Diagnosis Date  . Hypertension   . Diabetes mellitus   . Gout 12 days ago   History reviewed. No pertinent past surgical history. No family history on file. History  Substance Use Topics  . Smoking status: Never Smoker   . Smokeless tobacco: Not on file  . Alcohol Use: No   OB History   Grav Para Term Preterm Abortions TAB SAB Ect Mult Living                 Review of Systems  Constitutional: Positive for fever (Tmax 101F).  HENT: Positive for ear pain and sore throat. Negative for drooling and trouble swallowing.   Respiratory: Negative for cough and shortness of breath.   Gastrointestinal: Negative for vomiting.  Musculoskeletal: Positive for neck pain (anterior associated with lymphadenopathy).  All other systems reviewed and are negative.    Allergies  Review of patient's allergies indicates no known allergies.  Home Medications   Current Outpatient Rx   Name  Route  Sig  Dispense  Refill  . albuterol (PROVENTIL HFA;VENTOLIN HFA) 108 (90 BASE) MCG/ACT inhaler   Inhalation   Inhale 2 puffs into the lungs every 6 (six) hours as needed. Wheezing and shortness of breath         . glipiZIDE (GLUCOTROL XL) 2.5 MG 24 hr tablet   Oral   Take 2.5 mg by mouth daily with breakfast.         . insulin aspart (NOVOLOG) 100 UNIT/ML injection   Subcutaneous   Inject 5 Units into the skin 3 (three) times daily before meals.          . insulin glargine (LANTUS) 100 UNIT/ML injection   Subcutaneous   Inject 30 Units into the skin at bedtime.          Marland Kitchen losartan (COZAAR) 100 MG tablet   Oral   Take 100 mg by mouth daily.         . metFORMIN (GLUCOPHAGE) 1000 MG tablet   Oral   Take 1,000 mg by mouth 2 (two) times daily with a meal.         . HYDROcodone-acetaminophen (HYCET) 7.5-325 mg/15 ml solution   Oral   Take 15 mLs by mouth every 8 (eight) hours as needed for moderate pain.   120 mL   0    BP 161/104  Pulse 83  Temp(Src) 98.6 F (37 C) (Oral)  Resp 18  Ht 5\' 6"  (1.676 m)  Wt 235 lb (106.595 kg)  BMI 37.95 kg/m2  SpO2 97%  Physical Exam  Nursing note and vitals reviewed. Constitutional: She is oriented to person, place, and time. She appears well-developed and well-nourished. No distress.  HENT:  Head: Normocephalic and atraumatic.  Right Ear: Tympanic membrane, external ear and ear canal normal. No tenderness. No mastoid tenderness. Tympanic membrane is not retracted and not bulging.  Left Ear: Tympanic membrane, external ear and ear canal normal. No tenderness. No mastoid tenderness. Tympanic membrane is not retracted and not bulging.  Nose: Nose normal.  Mouth/Throat: Uvula is midline and mucous membranes are normal. No uvula swelling. Posterior oropharyngeal erythema present. No oropharyngeal exudate or posterior oropharyngeal edema.  Posterior oropharynx mildly erythematous. Tonsils mildly enlarged bilaterally  and erythematous without exudates. Uvula midline without evidence of peritonsillar abscess. Patient tolerating secretions without difficulty or drooling.  Eyes: Conjunctivae and EOM are normal. Pupils are equal, round, and reactive to light. No scleral icterus.  Neck: Normal range of motion. Neck supple.  No nuchal rigidity or meningismus  Cardiovascular: Normal rate, regular rhythm and normal heart sounds.   Pulmonary/Chest: Effort normal. No stridor. No respiratory distress.  Musculoskeletal: Normal range of motion.  Lymphadenopathy:    She has cervical adenopathy.  Neurological: She is alert and oriented to person, place, and time.  Skin: Skin is warm and dry. No rash noted. She is not diaphoretic. No erythema. No pallor.  Psychiatric: She has a normal mood and affect. Her behavior is normal.    ED Course  Procedures (including critical care time) Labs Review Labs Reviewed - No data to display Imaging Review No results found.  EKG Interpretation   None       MDM   1. Viral pharyngitis   2. Otalgia, left    Uncomplicated viral pharyngitis. Patient well and nontoxic appearing, hemodynamically stable, and afebrile. Physical exam findings significant for mild posterior oropharyngeal erythema as well as mild tonsillar enlargement bilaterally. Uvula midline without evidence of peritonsillar abscess and patient tolerating secretions without difficulty. No stridor. Ears without evidence of otitis media bilaterally. Pain likely secondary to pharyngitis and lymphadenopathy. Patient stable for discharge with primary care followup in 24-48 hours. Have advised ibuprofen and salt water gargles. Will prescribe Hycet as needed for severe sore throat. Return precautions discussed and patient agreeable to plan with no unaddressed concerns.   Antony Madura, PA-C 02/04/13 [phone]

## 2013-02-04 NOTE — ED Notes (Signed)
Pt c/o sore throat and ear ache x1wk, unable to sleep

## 2013-02-04 NOTE — ED Provider Notes (Signed)
Medical screening examination/treatment/procedure(s) were performed by non-physician practitioner and as supervising physician I was immediately available for consultation/collaboration.  EKG Interpretation   None         Audree Camel, MD 02/04/13 [phone]

## 2013-02-12 ENCOUNTER — Encounter (HOSPITAL_COMMUNITY): Payer: Self-pay | Admitting: Emergency Medicine

## 2013-02-12 ENCOUNTER — Emergency Department (HOSPITAL_COMMUNITY)
Admission: EM | Admit: 2013-02-12 | Discharge: 2013-02-12 | Disposition: A | Discharge status: Home / Self Care | Payer: Medicaid Other | Attending: Emergency Medicine | Admitting: Emergency Medicine

## 2013-02-12 DIAGNOSIS — Z79899 Other long term (current) drug therapy: Secondary | ICD-10-CM | POA: Insufficient documentation

## 2013-02-12 DIAGNOSIS — I10 Essential (primary) hypertension: Secondary | ICD-10-CM | POA: Insufficient documentation

## 2013-02-12 DIAGNOSIS — Z794 Long term (current) use of insulin: Secondary | ICD-10-CM | POA: Insufficient documentation

## 2013-02-12 DIAGNOSIS — E119 Type 2 diabetes mellitus without complications: Secondary | ICD-10-CM | POA: Insufficient documentation

## 2013-02-12 DIAGNOSIS — H9209 Otalgia, unspecified ear: Secondary | ICD-10-CM | POA: Insufficient documentation

## 2013-02-12 DIAGNOSIS — J029 Acute pharyngitis, unspecified: Secondary | ICD-10-CM | POA: Insufficient documentation

## 2013-02-12 LAB — RAPID STREP SCREEN (MED CTR MEBANE ONLY): STREPTOCOCCUS, GROUP A SCREEN (DIRECT): NEGATIVE

## 2013-02-12 MED ORDER — LIDOCAINE VISCOUS 2 % MT SOLN: 20.0000 mL | OROMUCOSAL | Status: DC | PRN

## 2013-02-12 NOTE — ED Notes (Signed)
Onset two weeks ago sore throat seen at another ED given pain with slight relief. Airway intact bilateral equal chest rise and fall.

## 2013-02-12 NOTE — ED Provider Notes (Signed)
CSN: [id number]     Arrival date & time 02/12/13  1478 History  This chart was scribed for Santiago Glad, PA-C, working with Flint Melter, MD by Blanchard Kelch, ED Scribe. This patient was seen in room TR10C/TR10C and the patient's care was started at 11:56 AM.    Chief Complaint  Patient presents with  . Sore Throat    Patient is a 53 y.o. female presenting with pharyngitis. The history is provided by the patient. No language interpreter was used.  Sore Throat  Sore Throat Associated symptoms include congestion, a fever and a sore throat.    HPI Comments: Crystal Boyer is a 53 y.o. female with a history of HTN, diabetes and gout who presents to the Emergency Department complaining of constant sore throat that began two weeks ago. She describes the pain as sharp. The pain is worsened by eating and swallowing.  She denies difficulty swallowing.  Two days ago she states she recorded a temperature of 102. No fever since that time.  She has associated congestion and ear pain. She was seen at [facility] eight days ago for the same symptoms and was discharged with a diagnosis of likely viral pharyngitis and a prescription for Hycet to use as needed. She states she has been taking the prescribed medication and Tylenol without relief.   Past Medical History  Diagnosis Date  . Hypertension   . Diabetes mellitus   . Gout 12 days ago   History reviewed. No pertinent past surgical history. No family history on file. History  Substance Use Topics  . Smoking status: Never Smoker   . Smokeless tobacco: Not on file  . Alcohol Use: No   OB History   Grav Para Term Preterm Abortions TAB SAB Ect Mult Living                 Review of Systems  Constitutional: Positive for fever.  HENT: Positive for congestion, ear pain, sore throat and trouble swallowing. Negative for voice change.   All other systems reviewed and are negative.    Allergies  Tramadol  Home Medications   Current  Outpatient Rx  Name  Route  Sig  Dispense  Refill  . acetaminophen (TYLENOL) 500 MG tablet   Oral   Take 500 mg by mouth 3 (three) times daily as needed for mild pain.         Marland Kitchen albuterol (PROVENTIL HFA;VENTOLIN HFA) 108 (90 BASE) MCG/ACT inhaler   Inhalation   Inhale 2 puffs into the lungs every 6 (six) hours as needed. Wheezing and shortness of breath         . glipiZIDE (GLUCOTROL XL) 2.5 MG 24 hr tablet   Oral   Take 2.5 mg by mouth daily with breakfast.         . HYDROcodone-acetaminophen (HYCET) 7.5-325 mg/15 ml solution   Oral   Take 15 mLs by mouth every 8 (eight) hours as needed for moderate pain.   120 mL   0   . insulin aspart (NOVOLOG) 100 UNIT/ML injection   Subcutaneous   Inject 5 Units into the skin 3 (three) times daily before meals.          . insulin glargine (LANTUS) 100 UNIT/ML injection   Subcutaneous   Inject 30 Units into the skin at bedtime.          Marland Kitchen losartan (COZAAR) 100 MG tablet   Oral   Take 100 mg by mouth daily.         Marland Kitchen  metFORMIN (GLUCOPHAGE) 1000 MG tablet   Oral   Take 1,000 mg by mouth 2 (two) times daily with a meal.          Triage Vitals: BP 172/93  Pulse 80  Temp(Src) 97.6 F (36.4 C) (Oral)  Resp 16  Ht 5\' 6"  (1.676 m)  Wt 235 lb (106.595 kg)  BMI 37.95 kg/m2  SpO2 98%  Physical Exam  Nursing note and vitals reviewed. Constitutional: She is oriented to person, place, and time. She appears well-developed and well-nourished. No distress.  HENT:  Head: Normocephalic and atraumatic.  Right Ear: Tympanic membrane, external ear and ear canal normal.  Left Ear: Tympanic membrane, external ear and ear canal normal.  Mouth/Throat: Uvula is midline. No trismus in the jaw. No uvula swelling. Posterior oropharyngeal erythema present. No oropharyngeal exudate or tonsillar abscesses.  Bilateral tonsillar enlargement. Mild erythema. Uvula midline. Patient handling secretions well. Normal voice phonation.   Eyes: EOM are  normal.  Neck: Normal range of motion. Neck supple. No tracheal deviation present.  Cardiovascular: Normal rate, regular rhythm and normal heart sounds.   Pulmonary/Chest: Effort normal and breath sounds normal. No respiratory distress.  Musculoskeletal: Normal range of motion.  Neurological: She is alert and oriented to person, place, and time.  Skin: Skin is warm and dry.  Psychiatric: She has a normal mood and affect. Her behavior is normal.    ED Course  Procedures (including critical care time)  DIAGNOSTIC STUDIES: Oxygen Saturation is 98% on room air, normal by my interpretation.    COORDINATION OF CARE: 12:01 PM -Will order rapid strep test. Patient verbalizes understanding and agrees with treatment plan.    Labs Review Labs Reviewed - No data to display Imaging Review No results found.  EKG Interpretation   None       MDM  No diagnosis found. Patient presenting with sore throat.  Rapid strep negative.  Patient tolerating PO liquids in the ED.  No signs of Peritonsillar Abscess or Retropharyngeal Abscess.  Patient instructed to follow up with PCP.  Return precautions given.  I personally performed the services described in this documentation, which was scribed in my presence. The recorded information has been reviewed and is accurate.    Santiago GladHeather Nastashia Gallo, PA-C 02/13/13 1154

## 2013-02-13 LAB — CULTURE, GROUP A STREP

## 2013-02-13 NOTE — ED Provider Notes (Signed)
Medical screening examination/treatment/procedure(s) were performed by non-physician practitioner and as supervising physician I was immediately available for consultation/collaboration.  EKG Interpretation   None        Flint MelterElliott L Astoria Condon, MD 02/13/13 [phone]

## 2013-02-15 ENCOUNTER — Emergency Department (HOSPITAL_COMMUNITY): Payer: Medicaid Other

## 2013-02-15 ENCOUNTER — Encounter (HOSPITAL_COMMUNITY): Payer: Self-pay | Admitting: Emergency Medicine

## 2013-02-15 ENCOUNTER — Emergency Department (HOSPITAL_COMMUNITY)
Admission: EM | Admit: 2013-02-15 | Discharge: 2013-02-15 | Disposition: A | Discharge status: Home / Self Care | Payer: Medicaid Other | Attending: Emergency Medicine | Admitting: Emergency Medicine

## 2013-02-15 DIAGNOSIS — R059 Cough, unspecified: Secondary | ICD-10-CM | POA: Insufficient documentation

## 2013-02-15 DIAGNOSIS — R946 Abnormal results of thyroid function studies: Secondary | ICD-10-CM | POA: Insufficient documentation

## 2013-02-15 DIAGNOSIS — H9209 Otalgia, unspecified ear: Secondary | ICD-10-CM | POA: Insufficient documentation

## 2013-02-15 DIAGNOSIS — R509 Fever, unspecified: Secondary | ICD-10-CM | POA: Insufficient documentation

## 2013-02-15 DIAGNOSIS — R220 Localized swelling, mass and lump, head: Secondary | ICD-10-CM | POA: Insufficient documentation

## 2013-02-15 DIAGNOSIS — R221 Localized swelling, mass and lump, neck: Secondary | ICD-10-CM

## 2013-02-15 DIAGNOSIS — Z794 Long term (current) use of insulin: Secondary | ICD-10-CM | POA: Insufficient documentation

## 2013-02-15 DIAGNOSIS — R1310 Dysphagia, unspecified: Secondary | ICD-10-CM | POA: Insufficient documentation

## 2013-02-15 DIAGNOSIS — Z792 Long term (current) use of antibiotics: Secondary | ICD-10-CM | POA: Insufficient documentation

## 2013-02-15 DIAGNOSIS — Z79899 Other long term (current) drug therapy: Secondary | ICD-10-CM | POA: Insufficient documentation

## 2013-02-15 DIAGNOSIS — J029 Acute pharyngitis, unspecified: Secondary | ICD-10-CM | POA: Insufficient documentation

## 2013-02-15 DIAGNOSIS — E119 Type 2 diabetes mellitus without complications: Secondary | ICD-10-CM | POA: Insufficient documentation

## 2013-02-15 DIAGNOSIS — I10 Essential (primary) hypertension: Secondary | ICD-10-CM | POA: Insufficient documentation

## 2013-02-15 DIAGNOSIS — R05 Cough: Secondary | ICD-10-CM | POA: Insufficient documentation

## 2013-02-15 LAB — POCT I-STAT, CHEM 8
BUN: 11 mg/dL (ref 6–23)
Calcium, Ion: 1.26 mmol/L — ABNORMAL HIGH (ref 1.12–1.23)
Chloride: 99 mEq/L (ref 96–112)
Creatinine, Ser: 0.8 mg/dL (ref 0.50–1.10)
Glucose, Bld: 323 mg/dL — ABNORMAL HIGH (ref 70–99)
HEMATOCRIT: 41 % (ref 36.0–46.0)
HEMOGLOBIN: 13.9 g/dL (ref 12.0–15.0)
POTASSIUM: 4.2 meq/L (ref 3.7–5.3)
SODIUM: 138 meq/L (ref 137–147)
TCO2: 28 mmol/L (ref 0–100)

## 2013-02-15 LAB — RAPID STREP SCREEN (MED CTR MEBANE ONLY): Streptococcus, Group A Screen (Direct): NEGATIVE

## 2013-02-15 MED ORDER — MORPHINE SULFATE 4 MG/ML IJ SOLN
4.0000 mg | Freq: Once | INTRAMUSCULAR | Status: AC
  Administered 2013-02-15: 4 mg via INTRAVENOUS
  Filled 2013-02-15: qty 1

## 2013-02-15 MED ORDER — CLINDAMYCIN HCL 150 MG PO CAPS: 300.0000 mg | ORAL_CAPSULE | Freq: Four times a day (QID) | ORAL | Status: DC

## 2013-02-15 MED ORDER — ONDANSETRON HCL 4 MG/2ML IJ SOLN
4.0000 mg | Freq: Once | INTRAMUSCULAR | Status: AC
  Administered 2013-02-15: 4 mg via INTRAVENOUS
  Filled 2013-02-15: qty 2

## 2013-02-15 MED ORDER — HYDROCODONE-ACETAMINOPHEN 7.5-325 MG/15ML PO SOLN: 15.0000 mL | Freq: Three times a day (TID) | ORAL | Status: DC | PRN

## 2013-02-15 MED ORDER — IOHEXOL 300 MG/ML  SOLN
100.0000 mL | Freq: Once | INTRAMUSCULAR | Status: AC | PRN
  Administered 2013-02-15: 100 mL via INTRAVENOUS

## 2013-02-15 MED ORDER — DIPHENHYDRAMINE HCL 25 MG PO TABS: 25.0000 mg | ORAL_TABLET | Freq: Four times a day (QID) | ORAL | Status: DC | PRN

## 2013-02-15 NOTE — ED Notes (Signed)
Pt states that she has had a sore throat since Christmas; pt [facility] and was advised that it was not strep; [facility] and was advised that not strep and was given Lidocaine for discomfort; pt continues to have sore throat esp to left side; pt states it "feels like glass" when she swallows on the left side; pt denies difficulty breathing states it is painful to swallow

## 2013-02-15 NOTE — Discharge Instructions (Signed)
Call for a follow up appointment with a Family or Primary Care Provider.  °Return if Symptoms worsen.   °Take medication as prescribed.  ° °

## 2013-02-15 NOTE — ED Provider Notes (Signed)
CSN: [id number]     Arrival date & time 02/15/13  0516 History   First MD Initiated Contact with Patient 02/15/13 [phone]     Chief Complaint  Patient presents with  . Sore Throat   (Consider location/radiation/quality/duration/timing/severity/associated sxs/prior Treatment) HPI Comments: Crystal Boyer is a 53 year-old female with a past medical history of HTN, DM, Gout, presenting the Emergency Department with a chief complaint of sore throat and swelling.  The patient reports a sore throat for 2 weeks.  She states increase in pain since this morning.  She reports increase in pain with swallowing and associated facial swelling.  The patient reprts using lidocaine without relief.  The patient reports she has had a fever this morning of 102.    Patient is a 53 y.o. female presenting with pharyngitis. The history is provided by the patient and medical records. No language interpreter was used.  Sore Throat Associated symptoms include chills, coughing, a fever and a sore throat.    Past Medical History  Diagnosis Date  . Hypertension   . Diabetes mellitus   . Gout 12 days ago   History reviewed. No pertinent past surgical history. No family history on file. History  Substance Use Topics  . Smoking status: Never Smoker   . Smokeless tobacco: Not on file  . Alcohol Use: No   OB History   Grav Para Term Preterm Abortions TAB SAB Ect Mult Living                 Review of Systems  Constitutional: Positive for fever and chills.  HENT: Positive for ear pain, facial swelling, sore throat and trouble swallowing. Negative for dental problem, drooling, mouth sores, sinus pressure and voice change.   Respiratory: Positive for cough.     Allergies  Tramadol  Home Medications   Current Outpatient Rx  Name  Route  Sig  Dispense  Refill  . acetaminophen (TYLENOL) 500 MG tablet   Oral   Take 500 mg by mouth 3 (three) times daily as needed for mild pain.         Marland Kitchen glipiZIDE  (GLUCOTROL XL) 2.5 MG 24 hr tablet   Oral   Take 2.5 mg by mouth daily with breakfast.         . insulin aspart (NOVOLOG) 100 UNIT/ML injection   Subcutaneous   Inject 5 Units into the skin 3 (three) times daily before meals.          . insulin glargine (LANTUS) 100 UNIT/ML injection   Subcutaneous   Inject 30 Units into the skin at bedtime.          . lidocaine (XYLOCAINE) 2 % solution   Mouth/Throat   Use as directed 20 mLs in the mouth or throat as needed for mouth pain.   100 mL   0   . losartan (COZAAR) 100 MG tablet   Oral   Take 100 mg by mouth daily.         . metFORMIN (GLUCOPHAGE) 1000 MG tablet   Oral   Take 1,000 mg by mouth 2 (two) times daily with a meal.         . albuterol (PROVENTIL HFA;VENTOLIN HFA) 108 (90 BASE) MCG/ACT inhaler   Inhalation   Inhale 2 puffs into the lungs every 6 (six) hours as needed. Wheezing and shortness of breath         . clindamycin (CLEOCIN) 150 MG capsule   Oral   Take 2  capsules (300 mg total) by mouth 4 (four) times daily. Take with food.   28 capsule   0   . diphenhydrAMINE (BENADRYL) 25 MG tablet   Oral   Take 1 tablet (25 mg total) by mouth every 6 (six) hours as needed for itching (Rash). With Hycet for Rash.   30 tablet   0   . HYDROcodone-acetaminophen (HYCET) 7.5-325 mg/15 ml solution   Oral   Take 15 mLs by mouth every 8 (eight) hours as needed for moderate pain.   120 mL   0    BP 139/92  Pulse 69  Temp(Src) 97.7 F (36.5 C) (Oral)  Resp 16  SpO2 96% Physical Exam  Nursing note and vitals reviewed. Constitutional: She appears well-developed and well-nourished.  HENT:  Head: Normocephalic and atraumatic.    Right Ear: Tympanic membrane and external ear normal.  Left Ear: Tympanic membrane and external ear normal.  Mouth/Throat: Uvula is midline. Mucous membranes are not dry. No trismus in the jaw. Normal dentition. No dental abscesses or uvula swelling. Posterior oropharyngeal edema and  posterior oropharyngeal erythema present. No oropharyngeal exudate or tonsillar abscesses.  No obvious abscess.  No sign of ludwig angina.   Eyes: Pupils are equal, round, and reactive to light. Right eye exhibits no discharge. Left eye exhibits no discharge.  Neck: Neck supple.  Cardiovascular: Normal rate and regular rhythm.   Pulmonary/Chest: Effort normal and breath sounds normal. She has no wheezes. She has no rales.  Abdominal: Soft.  Lymphadenopathy:       Head (left side): Submandibular and tonsillar adenopathy present.    ED Course  Procedures (including critical care time) Labs Review Labs Reviewed  POCT I-STAT, CHEM 8 - Abnormal; Notable for the following:    Glucose, Bld 323 (*)    Calcium, Ion 1.26 (*)    All other components within normal limits  RAPID STREP SCREEN  CULTURE, GROUP A STREP   Imaging Review CT Soft Tissue Neck W Contrast (Final result)  Result time: 02/15/13 08:47:57    Final result by Rad Results In Interface (02/15/13 08:47:57)    Narrative:   CLINICAL DATA: Sore throat since Christmas. Feels like glass. Pain worse on the left side.  EXAM: CT NECK WITH CONTRAST  TECHNIQUE: Multidetector CT imaging of the neck was performed using the standard protocol following the bolus administration of intravenous contrast.  CONTRAST: 100mL OMNIPAQUE IOHEXOL 300 MG/ML SOLN  COMPARISON: None.  FINDINGS: The spaces of the suprahyoid neck are normal and symmetric. There is no focal mass, adenopathy or focal fluid collection/inflammatory change. There is minimal prominence of the peritonsillar soft tissues. There is a 1 cm rounded cystic lesion abutting the scan over the right posterior neck at the C2-3 level likely a sebaceous cyst/ epidermal inclusion cyst. Pharynx and hypopharynx are patent. The epiglottis and subglottic airway is patent. There is mild-to-moderate CT calcified atherosclerotic plaque at the carotid bifurcations and proximal internal  carotid arteries without hemodynamically significant stenosis.  The infrahyoid neck demonstrates a 3.5 mm hypodensity over the right lobe of the thyroid likely a small cyst or adenoma. Visualized upper lungs and superior mediastinum are within normal. There is mild spondylosis of the spine. Prevertebral soft tissues are within normal.  IMPRESSION: Mild prominence of the peritonsillar soft tissues, otherwise unremarkable neck CT without evidence of focal mass, adenopathy or acute inflammation/fluid collection.  Atherosclerotic disease involving the carotid bifurcations and proximal internal carotid arteries bilaterally. No hemodynamically significant stenosis identified.  3.5 mm  cyst versus adenoma over the right lobe of the thyroid.  1 cm sebaceous cyst versus epidermal inclusion cyst over the right posterior neck at the C2-3 level.   Electronically Signed By: Elberta Fortis M.D. On: 02/15/2013 08:47    EKG Interpretation   None       MDM   1. Sore throat   2. Abnormal thyroid scan    Pt presents with worsening sore throat.  Mild swelling to left parotid gland. No obvious abscess on PE.  EMR reveals this is the pt's 3rd visit for sore throat (12/29, 1/7). Discussed patient history, condition, and labs with Dr. Juleen China.  After his evaluation of the patient he advises CT to evaluate for possible abscess. CT shows, mild prominence of peritonsillar soft tissue, no focal mass. Discussed lab results, imaging results (including abnormal thyroid result), and treatment plan with the patient. Return precautions given. Reports understanding and no other concerns at this time.  Patient is stable for discharge at this time.  Meds given in ED:  Medications  morphine 4 MG/ML injection 4 mg (4 mg Intravenous Given 02/15/13 0834)  ondansetron (ZOFRAN) injection 4 mg (4 mg Intravenous Given 02/15/13 0834)  iohexol (OMNIPAQUE) 300 MG/ML solution 100 mL (100 mLs Intravenous Contrast Given 02/15/13  0806)    Discharge Medication List as of 02/15/2013  9:01 AM    START taking these medications   Details  clindamycin (CLEOCIN) 150 MG capsule Take 2 capsules (300 mg total) by mouth 4 (four) times daily. Take with food., Starting 02/15/2013, Until Discontinued, Print    diphenhydrAMINE (BENADRYL) 25 MG tablet Take 1 tablet (25 mg total) by mouth every 6 (six) hours as needed for itching (Rash). With Hycet for Rash., Starting 02/15/2013, Until Discontinued, Print    HYDROcodone-acetaminophen (HYCET) 7.5-325 mg/15 ml solution Take 15 mLs by mouth every 8 (eight) hours as needed for moderate pain., Starting 02/15/2013, Until Discontinued, Print           Clabe Seal, PA-C 02/17/13 1347

## 2013-02-17 LAB — CULTURE, GROUP A STREP

## 2013-02-19 NOTE — ED Provider Notes (Signed)
Medical screening examination/treatment/procedure(s) were conducted as a shared visit with non-physician practitioner(s) and myself.  I personally evaluated the patient during the encounter.  EKG Interpretation   None      53 year old female with sore throat. CT without evidence of significant airway compromise or drainable collection. No respiratory distress on exam. Plan course of clindamycin he continued pain control. Return precautions were discussed.  Raeford RazorStephen Michaelann Gunnoe, MD 02/19/13 [phone]

## 2013-10-03 ENCOUNTER — Other Ambulatory Visit: Payer: Self-pay

## 2013-11-06 ENCOUNTER — Encounter: Payer: Self-pay | Admitting: Gastroenterology

## 2013-12-26 ENCOUNTER — Encounter: Payer: Self-pay | Admitting: *Deleted

## 2013-12-26 ENCOUNTER — Telehealth: Payer: Self-pay | Admitting: *Deleted

## 2013-12-26 NOTE — Telephone Encounter (Signed)
Patient no show for previsit appointment 12/26/13. Attempted both home and cell phone numbers. Left message on cell that previsit appointment needs to be rescheduled today by 5 pm or colonoscopy scheduled 01/09/14 would be cancelled and both appointments would need to be rescheduled.

## 2014-01-09 ENCOUNTER — Encounter: Payer: Medicaid Other | Admitting: Gastroenterology

## 2014-02-19 ENCOUNTER — Ambulatory Visit: Payer: Medicaid Other

## 2014-02-19 VITALS — Ht 66.5 in | Wt 239.8 lb

## 2014-02-19 DIAGNOSIS — Z1211 Encounter for screening for malignant neoplasm of colon: Secondary | ICD-10-CM

## 2014-02-19 MED ORDER — MOVIPREP 100 G PO SOLR: 1.0000 | Freq: Once | ORAL | Status: DC

## 2014-02-27 ENCOUNTER — Encounter: Payer: Medicaid Other | Admitting: Gastroenterology

## 2014-03-05 ENCOUNTER — Ambulatory Visit (AMBULATORY_SURGERY_CENTER): Payer: Medicaid Other | Admitting: Gastroenterology

## 2014-03-05 ENCOUNTER — Encounter: Payer: Self-pay | Admitting: Gastroenterology

## 2014-03-05 VITALS — BP 172/108 | HR 75 | Temp 98.0°F | Resp 17 | Ht 66.0 in | Wt 239.0 lb

## 2014-03-05 DIAGNOSIS — Z1211 Encounter for screening for malignant neoplasm of colon: Secondary | ICD-10-CM

## 2014-03-05 LAB — GLUCOSE, CAPILLARY
Glucose-Capillary: 193 mg/dL — ABNORMAL HIGH (ref 70–99)
Glucose-Capillary: 161 mg/dL — ABNORMAL HIGH (ref 70–99)

## 2014-03-05 MED ORDER — SODIUM CHLORIDE 0.9 % IV SOLN: 500.0000 mL | INTRAVENOUS | Status: DC

## 2014-03-05 NOTE — Patient Instructions (Signed)
YOU HAD AN ENDOSCOPIC PROCEDURE TODAY AT THE [facility]: Refer to the procedure report that was given to you for any specific questions about what was found during the examination.  If the procedure report does not answer your questions, please call your gastroenterologist to clarify.  If you requested that your care partner not be given the details of your procedure findings, then the procedure report has been included in a sealed envelope for you to review at your convenience later.  YOU SHOULD EXPECT: Some feelings of bloating in the abdomen. Passage of more gas than usual.  Walking can help get rid of the air that was put into your GI tract during the procedure and reduce the bloating. If you had a lower endoscopy (such as a colonoscopy or flexible sigmoidoscopy) you may notice spotting of blood in your stool or on the toilet paper. If you underwent a bowel prep for your procedure, then you may not have a normal bowel movement for a few days.  DIET: Your first meal following the procedure should be a light meal and then it is ok to progress to your normal diet.  A half-sandwich or bowl of soup is an example of a good first meal.  Heavy or fried foods are harder to digest and may make you feel nauseous or bloated.  Likewise meals heavy in dairy and vegetables can cause extra gas to form and this can also increase the bloating.  Drink plenty of fluids but you should avoid alcoholic beverages for 24 hours.  ACTIVITY: Your care partner should take you home directly after the procedure.  You should plan to take it easy, moving slowly for the rest of the day.  You can resume normal activity the day after the procedure however you should NOT DRIVE or use heavy machinery for 24 hours (because of the sedation medicines used during the test).    SYMPTOMS TO REPORT IMMEDIATELY: A gastroenterologist can be reached at any hour.  During normal business hours, 8:30 AM to 5:00 PM Monday through Friday,  call [phone].  After hours and on weekends, please call the GI answering service at [phone] who will take a message and have the physician on call contact you.   Following lower endoscopy (colonoscopy or flexible sigmoidoscopy):  Excessive amounts of blood in the stool  Significant tenderness or worsening of abdominal pains  Swelling of the abdomen that is new, acute  Fever of 100F or higher  FOLLOW UP: If any biopsies were taken you will be contacted by phone or by letter within the next 1-3 weeks.  Call your gastroenterologist if you have not heard about the biopsies in 3 weeks.  Our staff will call the home number listed on your records the next business day following your procedure to check on you and address any questions or concerns that you may have at that time regarding the information given to you following your procedure. This is a courtesy call and so if there is no answer at the home number and we have not heard from you through the emergency physician on call, we will assume that you have returned to your regular daily activities without incident.  SIGNATURES/CONFIDENTIALITY: You and/or your care partner have signed paperwork which will be entered into your electronic medical record.  These signatures attest to the fact that that the information above on your After Visit Summary has been reviewed and is understood.  Full responsibility of the confidentiality of this   discharge information lies with you and/or your care-partner.  Normal exam.  Repeat colonoscopy in 10 years-2026.  

## 2014-03-05 NOTE — Progress Notes (Signed)
A/ox3, pleased with MAC, report to RN 

## 2014-03-05 NOTE — Op Note (Signed)
[facility] [address] [place], [id number]   COLONOSCOPY PROCEDURE REPORT  PATIENT: Crystal Boyer, Crystal Boyer  MR#: [id number] BIRTHDATE: [birth year] , 53  yrs. old GENDER: female ENDOSCOPIST: Louis Meckelobert D Kaplan, MD REFERRED XB:JYNWGBY:Edwin Concepcion ElkAvbuere, M.D. PROCEDURE DATE:  03/05/2014 PROCEDURE:   Colonoscopy, diagnostic First Screening Colonoscopy - Avg.  risk and is 50 yrs.  old or older Yes.  Prior Negative Screening - Now for repeat screening. N/A  History of Adenoma - Now for follow-up colonoscopy & has been > or = to 3 yrs.  N/A  Polyps Removed Today? No.  Recommend repeat exam, <10 yrs? No. ASA CLASS:   Class II INDICATIONS:first colonoscopy and average risk for colon cancer. MEDICATIONS: Monitored anesthesia care and Propofol 240 mg IV  DESCRIPTION OF PROCEDURE:   After the risks benefits and alternatives of the procedure were thoroughly explained, informed consent was obtained.  The digital rectal exam revealed no abnormalities of the rectum.   The LB [id number]  endoscope was introduced through the anus and advanced to the cecum, which was identified by both the appendix and ileocecal valve. No adverse events experienced.   The quality of the prep was excellent using Suprep  The instrument was then slowly withdrawn as the colon was fully examined.      COLON FINDINGS: A normal appearing cecum, ileocecal valve, and appendiceal orifice were identified.  The ascending, transverse, descending, sigmoid colon, and rectum appeared unremarkable. Retroflexed views revealed no abnormalities. The time to cecum=2 minutes 59 seconds.  Withdrawal time=6 minutes 19 seconds.  The scope was withdrawn and the procedure completed. COMPLICATIONS: There were no immediate complications.  ENDOSCOPIC IMPRESSION: Normal colonoscopy  RECOMMENDATIONS: Continue current colorectal screening recommendations for "routine risk" patients with a repeat colonoscopy in 10  years.  eSigned:  Louis Meckelobert D Kaplan, MD 03/05/2014 11:21 AM   cc:

## 2014-03-06 ENCOUNTER — Telehealth: Payer: Self-pay | Admitting: *Deleted

## 2014-03-06 NOTE — Telephone Encounter (Signed)
Left message, follow-up, number [email or web address]

## 2015-01-03 ENCOUNTER — Emergency Department (HOSPITAL_COMMUNITY): Payer: Medicaid Other

## 2015-01-03 ENCOUNTER — Emergency Department (HOSPITAL_COMMUNITY)
Admission: EM | Admit: 2015-01-03 | Discharge: 2015-01-03 | Disposition: A | Discharge status: Home / Self Care | Payer: Medicaid Other | Attending: Emergency Medicine | Admitting: Emergency Medicine

## 2015-01-03 ENCOUNTER — Encounter (HOSPITAL_COMMUNITY): Payer: Self-pay | Admitting: Emergency Medicine

## 2015-01-03 DIAGNOSIS — Z79899 Other long term (current) drug therapy: Secondary | ICD-10-CM | POA: Diagnosis not present

## 2015-01-03 DIAGNOSIS — E119 Type 2 diabetes mellitus without complications: Secondary | ICD-10-CM | POA: Insufficient documentation

## 2015-01-03 DIAGNOSIS — N898 Other specified noninflammatory disorders of vagina: Secondary | ICD-10-CM | POA: Diagnosis present

## 2015-01-03 DIAGNOSIS — Z794 Long term (current) use of insulin: Secondary | ICD-10-CM | POA: Diagnosis not present

## 2015-01-03 DIAGNOSIS — B373 Candidiasis of vulva and vagina: Secondary | ICD-10-CM | POA: Insufficient documentation

## 2015-01-03 DIAGNOSIS — I10 Essential (primary) hypertension: Secondary | ICD-10-CM | POA: Diagnosis not present

## 2015-01-03 DIAGNOSIS — B3731 Acute candidiasis of vulva and vagina: Secondary | ICD-10-CM

## 2015-01-03 DIAGNOSIS — Z8739 Personal history of other diseases of the musculoskeletal system and connective tissue: Secondary | ICD-10-CM | POA: Diagnosis not present

## 2015-01-03 DIAGNOSIS — J45909 Unspecified asthma, uncomplicated: Secondary | ICD-10-CM | POA: Diagnosis not present

## 2015-01-03 DIAGNOSIS — Z8659 Personal history of other mental and behavioral disorders: Secondary | ICD-10-CM | POA: Insufficient documentation

## 2015-01-03 DIAGNOSIS — N390 Urinary tract infection, site not specified: Secondary | ICD-10-CM

## 2015-01-03 LAB — WET PREP, GENITAL
CLUE CELLS WET PREP: NONE SEEN
SPERM: NONE SEEN
Trich, Wet Prep: NONE SEEN

## 2015-01-03 LAB — COMPREHENSIVE METABOLIC PANEL
ALT: 30 U/L (ref 14–54)
ANION GAP: 6 (ref 5–15)
AST: 27 U/L (ref 15–41)
Albumin: 3.4 g/dL — ABNORMAL LOW (ref 3.5–5.0)
Alkaline Phosphatase: 145 U/L — ABNORMAL HIGH (ref 38–126)
BUN: 14 mg/dL (ref 6–20)
CHLORIDE: 104 mmol/L (ref 101–111)
CO2: 25 mmol/L (ref 22–32)
Calcium: 9.1 mg/dL (ref 8.9–10.3)
Creatinine, Ser: 0.82 mg/dL (ref 0.44–1.00)
Glucose, Bld: 383 mg/dL — ABNORMAL HIGH (ref 65–99)
POTASSIUM: 4.2 mmol/L (ref 3.5–5.1)
SODIUM: 135 mmol/L (ref 135–145)
Total Bilirubin: 0.3 mg/dL (ref 0.3–1.2)
Total Protein: 7.2 g/dL (ref 6.5–8.1)

## 2015-01-03 LAB — CBC WITH DIFFERENTIAL/PLATELET
BASOS PCT: 1 %
Basophils Absolute: 0 10*3/uL (ref 0.0–0.1)
EOS PCT: 4 %
Eosinophils Absolute: 0.2 10*3/uL (ref 0.0–0.7)
HEMATOCRIT: 37.5 % (ref 36.0–46.0)
Hemoglobin: 12.7 g/dL (ref 12.0–15.0)
Lymphocytes Relative: 32 %
Lymphs Abs: 1.8 10*3/uL (ref 0.7–4.0)
MCH: 28.8 pg (ref 26.0–34.0)
MCHC: 33.9 g/dL (ref 30.0–36.0)
MCV: 85 fL (ref 78.0–100.0)
MONO ABS: 0.5 10*3/uL (ref 0.1–1.0)
MONOS PCT: 8 %
NEUTROS ABS: 3 10*3/uL (ref 1.7–7.7)
Neutrophils Relative %: 55 %
Platelets: 186 10*3/uL (ref 150–400)
RBC: 4.41 MIL/uL (ref 3.87–5.11)
RDW: 13.1 % (ref 11.5–15.5)
WBC: 5.5 10*3/uL (ref 4.0–10.5)

## 2015-01-03 LAB — URINE MICROSCOPIC-ADD ON

## 2015-01-03 LAB — URINALYSIS, ROUTINE W REFLEX MICROSCOPIC
Bilirubin Urine: NEGATIVE
Glucose, UA: >1000 mg/dL — AB
Ketones, ur: NEGATIVE mg/dL
NITRITE: NEGATIVE
PROTEIN: NEGATIVE mg/dL
Specific Gravity, Urine: 1.027 (ref 1.005–1.030)
pH: 5.5 (ref 5.0–8.0)

## 2015-01-03 LAB — LIPASE, BLOOD: LIPASE: 46 U/L (ref 11–51)

## 2015-01-03 MED ORDER — CEFTRIAXONE SODIUM 250 MG IJ SOLR
250.0000 mg | Freq: Once | INTRAMUSCULAR | Status: AC
  Administered 2015-01-03: 250 mg via INTRAMUSCULAR
  Filled 2015-01-03: qty 250

## 2015-01-03 MED ORDER — CEPHALEXIN 500 MG PO CAPS: 500.0000 mg | ORAL_CAPSULE | Freq: Two times a day (BID) | ORAL | Status: DC

## 2015-01-03 MED ORDER — FLUCONAZOLE 150 MG PO TABS: 150.0000 mg | ORAL_TABLET | Freq: Once | ORAL | Status: DC

## 2015-01-03 MED ORDER — FLUCONAZOLE 150 MG PO TABS
150.0000 mg | ORAL_TABLET | Freq: Once | ORAL | Status: AC
Start: 2015-01-03 — End: 2015-01-03
  Administered 2015-01-03: 150 mg via ORAL
  Filled 2015-01-03: qty 1

## 2015-01-03 MED ORDER — ONDANSETRON 4 MG PO TBDP
4.0000 mg | ORAL_TABLET | Freq: Once | ORAL | Status: AC
  Administered 2015-01-03: 4 mg via ORAL
  Filled 2015-01-03: qty 1

## 2015-01-03 MED ORDER — AZITHROMYCIN 250 MG PO TABS
1000.0000 mg | ORAL_TABLET | Freq: Once | ORAL | Status: AC
  Administered 2015-01-03: 1000 mg via ORAL
  Filled 2015-01-03: qty 4

## 2015-01-03 NOTE — ED Notes (Signed)
Patient c/o thick, white vaginal discharge. Patient states her vagina feels swollen. Patient states the amount of discharge scares her. Patient used monostat when symptoms started, it did not help.

## 2015-01-03 NOTE — ED Notes (Signed)
Pelvic cart at bedside. 

## 2015-01-03 NOTE — ED Provider Notes (Signed)
CSN: [id number]     Arrival date & time 01/03/15  0600 History   First MD Initiated Contact with Patient 01/03/15 0703     Chief Complaint  Patient presents with  . Vaginal Discharge    "thick & white"     (Consider location/radiation/quality/duration/timing/severity/associated sxs/prior Treatment) HPI Comments: 54yo F w/ PMH including IDDM, HTN, gout, asthma, hysterectomy who p/w vaginal discharge. Patient endorses a 2 to three-week history of white vaginal discharge that was initially light but has become very heavy recently despite using Monistat and another over-the-counter topical anti-yeast medication. She endorses mild, constant, suprapubic abdominal pain and some dysuria. The pain radiates to her lower back. She has had some diarrhea but no vomiting. No blood in her stool. She reports fevers up to 102 yesterday as well as one week of cough/cold symptoms. Her entire family is sick with similar URI symptoms. She has had some mild shortness of breath and chest tightness with the cold symptoms for the past week. BG has been elevated in 300s. Pt is recently sexually active with a new partner in addition to her current partner; she did not use protection.  Patient is a 54 y.o. female presenting with vaginal discharge. The history is provided by the patient.  Vaginal Discharge   Past Medical History  Diagnosis Date  . Hypertension   . Diabetes mellitus   . Gout 12 days ago  . Asthma   . Allergy   . Depression    Past Surgical History  Procedure Laterality Date  . Abdominal hysterectomy    . Ankle fracture surgery      right ankle  . Cesarean section     Family History  Problem Relation Age of Onset  . Colon cancer Neg Hx    Social History  Substance Use Topics  . Smoking status: Never Smoker   . Smokeless tobacco: Never Used  . Alcohol Use: No     Comment: social   OB History    No data available     Review of Systems  Genitourinary: Positive for vaginal discharge.     10 Systems reviewed and are negative for acute change except as noted in the HPI.   Allergies  Eggs or egg-derived products and Tramadol  Home Medications   Prior to Admission medications   Medication Sig Start Date End Date Taking? Authorizing Provider  acetaminophen (TYLENOL) 500 MG tablet Take 500 mg by mouth 3 (three) times daily as needed for mild pain.   Yes Historical Provider, MD  albuterol (PROVENTIL HFA;VENTOLIN HFA) 108 (90 BASE) MCG/ACT inhaler Inhale 2 puffs into the lungs every 6 (six) hours as needed. Wheezing and shortness of breath   Yes Historical Provider, MD  glipiZIDE (GLUCOTROL) 10 MG tablet Take 10 mg by mouth daily with breakfast. 01/21/14  Yes Historical Provider, MD  insulin aspart (NOVOLOG) 100 UNIT/ML injection Inject 5 Units into the skin 3 (three) times daily before meals.    Yes Historical Provider, MD  insulin glargine (LANTUS) 100 UNIT/ML injection Inject 30 Units into the skin at bedtime.    Yes Historical Provider, MD  losartan (COZAAR) 50 MG tablet Take 50 mg by mouth. 01/17/14  Yes Historical Provider, MD  metFORMIN (GLUCOPHAGE) 1000 MG tablet Take 1,000 mg by mouth 2 (two) times daily with a meal.   Yes Historical Provider, MD  cephALEXin (KEFLEX) 500 MG capsule Take 1 capsule (500 mg total) by mouth 2 (two) times daily. 01/03/15   Laurence Spates, MD  fluconazole (DIFLUCAN) 150 MG tablet Take 1 tablet (150 mg total) by mouth once. Take 3 days after first dose if symptoms continue 01/03/15   Ambrose Finland Garritt Molyneux, MD   BP 144/93 mmHg  Pulse 70  Temp(Src) 97.6 F (36.4 C) (Oral)  Resp 16  Ht  (1.676 m)  Wt 230 lb (104.327 kg)  BMI 37.14 kg/m2  SpO2 97% Physical Exam  Constitutional: She is oriented to person, place, and time. She appears well-developed and well-nourished. No distress.  HENT:  Head: Normocephalic and atraumatic.  Moist mucous membranes  Eyes: Conjunctivae are normal. Pupils are equal, round, and reactive to light.   Neck: Neck supple.  Cardiovascular: Normal rate, regular rhythm and normal heart sounds.   No murmur heard. Pulmonary/Chest: Effort normal and breath sounds normal.  Abdominal: Soft. Bowel sounds are normal. She exhibits no distension. There is no rebound and no guarding.  + suprapubic abd tenderness, no RLQ TTP  Genitourinary:  Vaginal irritation, thick white vaginal discharge in vaginal vault  Musculoskeletal: She exhibits no edema.  Neurological: She is alert and oriented to person, place, and time.  Fluent speech  Skin: Skin is warm and dry.  Psychiatric: She has a normal mood and affect. Judgment normal.  Nursing note and vitals reviewed.  Chaperone was present during exam.  ED Course  Procedures (including critical care time) Labs Review Labs Reviewed  WET PREP, GENITAL - Abnormal; Notable for the following:    Yeast Wet Prep HPF POC PRESENT (*)    WBC, Wet Prep HPF POC MANY (*)    All other components within normal limits  COMPREHENSIVE METABOLIC PANEL - Abnormal; Notable for the following:    Glucose, Bld 383 (*)    Albumin 3.4 (*)    Alkaline Phosphatase 145 (*)    All other components within normal limits  URINALYSIS, ROUTINE W REFLEX MICROSCOPIC (NOT AT [facility]) - Abnormal; Notable for the following:    APPearance HAZY (*)    Glucose, UA >1000 (*)    Hgb urine dipstick TRACE (*)    Leukocytes, UA MODERATE (*)    All other components within normal limits  URINE MICROSCOPIC-ADD ON - Abnormal; Notable for the following:    Squamous Epithelial / LPF 0-5 (*)    Bacteria, UA FEW (*)    All other components within normal limits  URINE CULTURE  CBC WITH DIFFERENTIAL/PLATELET  LIPASE, BLOOD  GC/CHLAMYDIA PROBE AMP ([place]) NOT AT [facility]    Imaging Review Dg Chest 2 View  01/03/2015  CLINICAL DATA:  Mid chest pain which shortness-of-breath and productive cough/fever 2 days. EXAM: CHEST  2 VIEW COMPARISON:  06/16/2010 FINDINGS: Lungs are adequately inflated without  consolidation or effusion. Cardiomediastinal silhouette is within normal for there are minimal degenerative changes of the spine. IMPRESSION: No active cardiopulmonary disease. Electronically Signed   By: Elberta Fortis M.D.   On: 01/03/2015 08:30   I have personally reviewed and evaluated these lab results as part of my medical decision-making.   EKG Interpretation None     Medications  ondansetron (ZOFRAN-ODT) disintegrating tablet 4 mg (not administered)  azithromycin (ZITHROMAX) tablet 1,000 mg (not administered)  cefTRIAXone (ROCEPHIN) injection 250 mg (not administered)  fluconazole (DIFLUCAN) tablet 150 mg (not administered)    MDM   Final diagnoses:  Vaginal candidiasis  UTI (lower urinary tract infection)   hyperglycemia in setting of IDDM  Pt p/w 2-3 weeks of white vaginal discharge that has been worsening despite over-the-counter yeast treatments  as well as elevated blood glucose and dysuria associated with lower abdominal pain. Patient also notes 1 week of URI symptoms similar to family members. Patient with mild suprapubic tenderness on exam, no focal right lower quadrant tenderness. Thick white vaginal d/c on exam w/ vaginal irritation. Obtained above labs because of the patient's underlying diabetes and complaints of abdominal pain.  Labwork showed hyperglycemia without DKA. Normal CBC. Catheterized UA with some bacteria and WBCs, culture sent. Exam c/w vaginal candidiasis, gave patient Diflucan as well as prescription to use in 3 days of ongoing symptoms. Because of her recent sexual encounter, as well as her vaginal irritation, also empirically treated for GC/chlamydia with IM ceftriaxone and azithromycin. PID unlikely as patient has had hysterectomy. Because of dysuria and lower abdominal pain, we will treat her UTI with Keflex. I emphasized the importance of follow-up with PCP as her blood sugar is poorly controlled currently. Reviewed return precautions including vomiting,  fever, or any worsening symptoms. Patient voiced understanding was discharged in satisfactory condition.   Laurence Spatesachel Morgan Taraann Olthoff, MD 01/03/15 [phone]

## 2015-01-05 LAB — GC/CHLAMYDIA PROBE AMP (~~LOC~~) NOT AT ARMC
CHLAMYDIA, DNA PROBE: NEGATIVE
NEISSERIA GONORRHEA: NEGATIVE

## 2015-01-05 LAB — URINE CULTURE

## 2015-08-15 ENCOUNTER — Emergency Department (HOSPITAL_COMMUNITY)
Admission: EM | Admit: 2015-08-15 | Discharge: 2015-08-15 | Disposition: A | Discharge status: Home / Self Care | Payer: Medicaid Other | Attending: Emergency Medicine | Admitting: Emergency Medicine

## 2015-08-15 ENCOUNTER — Encounter (HOSPITAL_COMMUNITY): Payer: Self-pay

## 2015-08-15 DIAGNOSIS — B379 Candidiasis, unspecified: Secondary | ICD-10-CM | POA: Insufficient documentation

## 2015-08-15 DIAGNOSIS — Z79899 Other long term (current) drug therapy: Secondary | ICD-10-CM | POA: Diagnosis not present

## 2015-08-15 DIAGNOSIS — F329 Major depressive disorder, single episode, unspecified: Secondary | ICD-10-CM | POA: Insufficient documentation

## 2015-08-15 DIAGNOSIS — J45909 Unspecified asthma, uncomplicated: Secondary | ICD-10-CM | POA: Insufficient documentation

## 2015-08-15 DIAGNOSIS — E119 Type 2 diabetes mellitus without complications: Secondary | ICD-10-CM | POA: Diagnosis not present

## 2015-08-15 DIAGNOSIS — R300 Dysuria: Secondary | ICD-10-CM | POA: Diagnosis not present

## 2015-08-15 DIAGNOSIS — M25579 Pain in unspecified ankle and joints of unspecified foot: Secondary | ICD-10-CM

## 2015-08-15 DIAGNOSIS — M79673 Pain in unspecified foot: Secondary | ICD-10-CM | POA: Diagnosis present

## 2015-08-15 DIAGNOSIS — M79671 Pain in right foot: Secondary | ICD-10-CM | POA: Diagnosis not present

## 2015-08-15 DIAGNOSIS — B372 Candidiasis of skin and nail: Secondary | ICD-10-CM

## 2015-08-15 DIAGNOSIS — M79672 Pain in left foot: Secondary | ICD-10-CM | POA: Insufficient documentation

## 2015-08-15 DIAGNOSIS — I10 Essential (primary) hypertension: Secondary | ICD-10-CM

## 2015-08-15 LAB — URINALYSIS, ROUTINE W REFLEX MICROSCOPIC
BILIRUBIN URINE: NEGATIVE
Glucose, UA: >1000 mg/dL — AB
HGB URINE DIPSTICK: NEGATIVE
Ketones, ur: NEGATIVE mg/dL
Nitrite: NEGATIVE
PH: 5.5 (ref 5.0–8.0)
Protein, ur: NEGATIVE mg/dL
SPECIFIC GRAVITY, URINE: 1.036 — AB (ref 1.005–1.030)

## 2015-08-15 LAB — URINE MICROSCOPIC-ADD ON

## 2015-08-15 MED ORDER — HYDROCODONE-ACETAMINOPHEN 5-325 MG PO TABS: 1.0000 | ORAL_TABLET | Freq: Three times a day (TID) | ORAL | Status: DC | PRN

## 2015-08-15 MED ORDER — FLUCONAZOLE 150 MG PO TABS
150.0000 mg | ORAL_TABLET | Freq: Once | ORAL | Status: AC
  Administered 2015-08-15: 150 mg via ORAL
  Filled 2015-08-15: qty 1

## 2015-08-15 MED ORDER — INDOMETHACIN 25 MG PO CAPS: 25.0000 mg | ORAL_CAPSULE | Freq: Three times a day (TID) | ORAL | Status: DC | PRN

## 2015-08-15 MED ORDER — NYSTATIN 100000 UNIT/GM EX CREA: TOPICAL_CREAM | CUTANEOUS | Status: DC

## 2015-08-15 NOTE — ED Notes (Signed)
Pt here with gout foot pain for weeks.  Pt does not take meds for it at this time.  Pt also having burning with urination.  Started 2 weeks ago.  Fever day before yesterday with headaches.

## 2015-08-15 NOTE — Discharge Instructions (Signed)
Cutaneous Candidiasis Cutaneous candidiasis is a condition in which there is an overgrowth of yeast (candida) on the skin. Yeast normally live on the skin, but in small enough numbers not to cause any symptoms. In certain cases, increased growth of the yeast may cause an actual yeast infection. This kind of infection usually occurs in areas of the skin that are constantly warm and moist, such as the armpits or the groin. Yeast is the most common cause of diaper rash in babies and in people who cannot control their bowel movements (incontinence). CAUSES  The fungus that most often causes cutaneous candidiasis is Candida albicans. Conditions that can increase the risk of getting a yeast infection of the skin include:  Obesity.  Pregnancy.  Diabetes.  Taking antibiotic medicine.  Taking birth control pills.  Taking steroid medicines.  Thyroid disease.  An iron or zinc deficiency.  Problems with the immune system. SYMPTOMS   Red, swollen area of the skin.  Bumps on the skin.  Itchiness. DIAGNOSIS  The diagnosis of cutaneous candidiasis is usually based on its appearance. Light scrapings of the skin may also be taken and viewed under a microscope to identify the presence of yeast. TREATMENT  Antifungal creams may be applied to the infected skin. In severe cases, oral medicines may be needed.  HOME CARE INSTRUCTIONS   Keep your skin clean and dry.  Maintain a healthy weight.  If you have diabetes, keep your blood sugar under control. SEEK IMMEDIATE MEDICAL CARE IF:  Your rash continues to spread despite treatment.  You have a fever, chills, or abdominal pain.   This information is not intended to replace advice given to you by your health care provider. Make sure you discuss any questions you have with your health care provider.   Document Released: 10/12/2010 Document Revised: 04/18/2011 Document Reviewed: 07/28/2014 Elsevier Interactive Patient Education 2016 [employer].  Gout Gout is an inflammatory arthritis caused by a buildup of uric acid crystals in the joints. Uric acid is a chemical that is normally present in the blood. When the level of uric acid in the blood is too high it can form crystals that deposit in your joints and tissues. This causes joint redness, soreness, and swelling (inflammation). Repeat attacks are common. Over time, uric acid crystals can form into masses (tophi) near a joint, destroying bone and causing disfigurement. Gout is treatable and often preventable. CAUSES  The disease begins with elevated levels of uric acid in the blood. Uric acid is produced by your body when it breaks down a naturally found substance called purines. Certain foods you eat, such as meats and fish, contain high amounts of purines. Causes of an elevated uric acid level include:  Being passed down from parent to child (heredity).  Diseases that cause increased uric acid production (such as obesity, psoriasis, and certain cancers).  Excessive alcohol use.  Diet, especially diets rich in meat and seafood.  Medicines, including certain cancer-fighting medicines (chemotherapy), water pills (diuretics), and aspirin.  Chronic kidney disease. The kidneys are no longer able to remove uric acid well.  Problems with metabolism. Conditions strongly associated with gout include:  Obesity.  High blood pressure.  High cholesterol.  Diabetes. Not everyone with elevated uric acid levels gets gout. It is not understood why some people get gout and others do not. Surgery, joint injury, and eating too much of certain foods are some of the factors that can lead to gout attacks. SYMPTOMS   An attack of  gout comes on quickly. It causes intense pain with redness, swelling, and warmth in a joint.  Fever can occur.  Often, only one joint is involved. Certain joints are more commonly involved:  Base of the big toe.  Knee.  Ankle.  Wrist.  Finger. Without  treatment, an attack usually goes away in a few days to weeks. Between attacks, you usually will not have symptoms, which is different from many other forms of arthritis. DIAGNOSIS  Your caregiver will suspect gout based on your symptoms and exam. In some cases, tests may be recommended. The tests may include:  Blood tests.  Urine tests.  X-rays.  Joint fluid exam. This exam requires a needle to remove fluid from the joint (arthrocentesis). Using a microscope, gout is confirmed when uric acid crystals are seen in the joint fluid. TREATMENT  There are two phases to gout treatment: treating the sudden onset (acute) attack and preventing attacks (prophylaxis).  Treatment of an Acute Attack.  Medicines are used. These include anti-inflammatory medicines or steroid medicines.  An injection of steroid medicine into the affected joint is sometimes necessary.  The painful joint is rested. Movement can worsen the arthritis.  You may use warm or cold treatments on painful joints, depending which works best for you.  Treatment to Prevent Attacks.  If you suffer from frequent gout attacks, your caregiver may advise preventive medicine. These medicines are started after the acute attack subsides. These medicines either help your kidneys eliminate uric acid from your body or decrease your uric acid production. You may need to stay on these medicines for a very long time.  The early phase of treatment with preventive medicine can be associated with an increase in acute gout attacks. For this reason, during the first few months of treatment, your caregiver may also advise you to take medicines usually used for acute gout treatment. Be sure you understand your caregiver's directions. Your caregiver may make several adjustments to your medicine dose before these medicines are effective.  Discuss dietary treatment with your caregiver or dietitian. Alcohol and drinks high in sugar and fructose and foods  such as meat, poultry, and seafood can increase uric acid levels. Your caregiver or dietitian can advise you on drinks and foods that should be limited. HOME CARE INSTRUCTIONS   Do not take aspirin to relieve pain. This raises uric acid levels.  Only take over-the-counter or prescription medicines for pain, discomfort, or fever as directed by your caregiver.  Rest the joint as much as possible. When in bed, keep sheets and blankets off painful areas.  Keep the affected joint raised (elevated).  Apply warm or cold treatments to painful joints. Use of warm or cold treatments depends on which works best for you.  Use crutches if the painful joint is in your leg.  Drink enough fluids to keep your urine clear or pale yellow. This helps your body get rid of uric acid. Limit alcohol, sugary drinks, and fructose drinks.  Follow your dietary instructions. Pay careful attention to the amount of protein you eat. Your daily diet should emphasize fruits, vegetables, whole grains, and fat-free or low-fat milk products. Discuss the use of coffee, vitamin C, and cherries with your caregiver or dietitian. These may be helpful in lowering uric acid levels.  Maintain a healthy body weight. SEEK MEDICAL CARE IF:   You develop diarrhea, vomiting, or any side effects from medicines.  You do not feel better in 24 hours, or you are getting worse. SEEK  IMMEDIATE MEDICAL CARE IF:   Your joint becomes suddenly more tender, and you have chills or a fever. MAKE SURE YOU:   Understand these instructions.  Will watch your condition.  Will get help right away if you are not doing well or get worse.   This information is not intended to replace advice given to you by your health care provider. Make sure you discuss any questions you have with your health care provider.   Document Released: 01/22/2000 Document Revised: 02/14/2014 Document Reviewed: 09/07/2011 Elsevier Interactive Patient Education [id number]  [employer]

## 2015-08-15 NOTE — ED Provider Notes (Signed)
CSN: [id number]     Arrival date & time 08/15/15  1815 History  By signing my name below, I, Bridgette Habermann, attest that this documentation has been prepared under the direction and in the presence of [employer], PA-C. Electronically Signed: Bridgette Habermann, ED Scribe. 08/15/2015. 8:15 PM.    Chief Complaint  Patient presents with  . Foot Pain  . Dysuria  . Vaginal Pain   The history is provided by the patient. No language interpreter was used.   HPI Comments: Crystal Boyer is a 55 y.o. female with h/o IDDM who presents to the Emergency Department complaining of intermittent, burning, bilateral foot pain onset a month ago. She says pain is mostly focused on her big toes. Pt tried to schedule an appointment with her PCP but he cannot see her until next week and she "cannot bare the pain anymore". Pt notes that pain is exacerbated when she eats red meat' she thinks the symptoms present due to her gout. Pt reports she has also had c/o dysuria and a burning sensation in her vagina that started two weeks ago. Pt has tried baby powder to alleviate the burning sensation but with no relief. Pt has diabetes and has been on insulin for 5 years with poor control of her sugars. Pt denies associated fever, trauma, extremity numbness, vomiting, or abdominal pain.  Past Medical History  Diagnosis Date  . Hypertension   . Diabetes mellitus   . Gout 12 days ago  . Asthma   . Allergy   . Depression    Past Surgical History  Procedure Laterality Date  . Abdominal hysterectomy    . Ankle fracture surgery      right ankle  . Cesarean section     Family History  Problem Relation Age of Onset  . Colon cancer Neg Hx    Social History  Substance Use Topics  . Smoking status: Never Smoker   . Smokeless tobacco: Never Used  . Alcohol Use: No     Comment: social   OB History    No data available      Review of Systems  Constitutional: Negative for fever.  Genitourinary: Positive for dysuria, vaginal  discharge and vaginal pain.  Musculoskeletal: Positive for arthralgias.  All other systems reviewed and are negative.   Allergies  Eggs or egg-derived products and Tramadol  Home Medications   Prior to Admission medications   Medication Sig Start Date End Date Taking? Authorizing Provider  acetaminophen (TYLENOL) 500 MG tablet Take 500 mg by mouth 3 (three) times daily as needed for mild pain.    Historical Provider, MD  albuterol (PROVENTIL HFA;VENTOLIN HFA) 108 (90 BASE) MCG/ACT inhaler Inhale 2 puffs into the lungs every 6 (six) hours as needed. Wheezing and shortness of breath    Historical Provider, MD  cephALEXin (KEFLEX) 500 MG capsule Take 1 capsule (500 mg total) by mouth 2 (two) times daily. 01/03/15   Laurence Spates, MD  fluconazole (DIFLUCAN) 150 MG tablet Take 1 tablet (150 mg total) by mouth once. Take 3 days after first dose if symptoms continue 01/03/15   Laurence Spates, MD  glipiZIDE (GLUCOTROL) 10 MG tablet Take 10 mg by mouth daily with breakfast. 01/21/14   Historical Provider, MD  HYDROcodone-acetaminophen (NORCO/VICODIN) 5-325 MG tablet Take 1-2 tablets by mouth every 8 (eight) hours as needed for severe pain. 08/15/15   Antony Madura, PA-C  indomethacin (INDOCIN) 25 MG capsule Take 1 capsule (25 mg total) by mouth  3 (three) times daily as needed for mild pain or moderate pain. 08/15/15   Antony Madura, PA-C  insulin aspart (NOVOLOG) 100 UNIT/ML injection Inject 5 Units into the skin 3 (three) times daily before meals.     Historical Provider, MD  insulin glargine (LANTUS) 100 UNIT/ML injection Inject 30 Units into the skin at bedtime.     Historical Provider, MD  losartan (COZAAR) 50 MG tablet Take 50 mg by mouth. 01/17/14   Historical Provider, MD  metFORMIN (GLUCOPHAGE) 1000 MG tablet Take 1,000 mg by mouth 2 (two) times daily with a meal.    Historical Provider, MD  nystatin cream (MYCOSTATIN) Apply to affected area 2 times daily 08/15/15   Antony Madura, PA-C   BP  182/110 mmHg  Pulse 88  Temp(Src) 98.5 F (36.9 C) (Oral)  Resp 22  Ht 5\' 6"  (1.676 m)  Wt 104.327 kg  BMI 37.14 kg/m2  SpO2 100%   Physical Exam  Constitutional: She is oriented to person, place, and time. She appears well-developed and well-nourished. No distress.  Nontoxic appearing; in NAD  HENT:  Head: Normocephalic and atraumatic.  Eyes: Conjunctivae and EOM are normal. No scleral icterus.  Neck: Normal range of motion.  Cardiovascular: Normal rate, regular rhythm and intact distal pulses.   DP and PT pulses 2+ bilaterally.  Pulmonary/Chest: Effort normal. No respiratory distress.  Respirations even and unlabored.  Genitourinary:  Mild erythema and soreness noted to perineum and lateral to the b/l labia majora. No skin peeling, sloughing, vesicles, pustules.  Musculoskeletal: Normal range of motion.  Tenderness to palpation to bilateral great toes without bony deformity or crepitus. Normal range of motion of foot and ankle bilaterally. No swelling or effusion. No erythema.  Neurological: She is alert and oriented to person, place, and time. She exhibits normal muscle tone. Coordination normal.  Sensation to light touch intact. Patient able to wiggle all toes.  Skin: Skin is warm and dry. No rash noted. She is not diaphoretic. No erythema. No pallor.  No erythema, swelling, or heat to touch noted to bilateral extremities. No ulcerations or sores.  Psychiatric: She has a normal mood and affect. Her behavior is normal.  Nursing note and vitals reviewed.   ED Course  Procedures  DIAGNOSTIC STUDIES: Oxygen Saturation is 94% on RA, poor by my interpretation.    COORDINATION OF CARE: 8:14 PM Discussed treatment plan with pt at bedside which includes Diflucan, topical cream, and PCP follow-up and pt agreed to plan.  Labs Review Labs Reviewed  URINALYSIS, ROUTINE W REFLEX MICROSCOPIC (NOT AT [facility]) - Abnormal; Notable for the following:    APPearance HAZY (*)    Specific  Gravity, Urine 1.036 (*)    Glucose, UA >1000 (*)    Leukocytes, UA SMALL (*)    All other components within normal limits  URINE MICROSCOPIC-ADD ON - Abnormal; Notable for the following:    Squamous Epithelial / LPF 0-5 (*)    Bacteria, UA RARE (*)    All other components within normal limits  URINE CULTURE   I have personally reviewed and evaluated these lab results as part of my medical decision-making.  MDM   Final diagnoses:  Cutaneous candidiasis  Arthralgia of foot, unspecified laterality  Hypertension not at goal    55 year old female presents to the emergency department for evaluation of bilateral foot pain, primarily to her great toes. Patient is neurovascularly intact today with no evidence of septic joint. No concern for soft tissue infection or cellulitis.  It is possible that the patient's symptoms may be due to gout. She reports a history of similar symptoms with a gout flare. She interviewed worsening symptoms to consumption of red meats. It is also possible that this patient's symptoms may be due to diabetic neuropathy as she has a history of poorly controlled sugars despite her insulin regimen. Given reassuring workup and chronicity of symptoms, will treat symptomatically with indomethacin and short course of Norco. The patient has been encouraged to follow up with her primary care doctor regarding her symptoms today.  Patient also complaining of burning to her external vaginal area with dysuria. She has no evidence of urinary tract infection today, though urinalysis is positive for yeast. Her symptoms and physical exam are consistent with cutaneous candidiasis, also suspected to be secondary to her poorly controlled diabetes. Patient has been given a tablet of Diflucan in the emergency department. Will discharge with Mycostatin. No indication for further emergent work up at this time. Patient discharged in satisfactory condition with no unaddressed concerns.  I personally  performed the services described in this documentation, which was scribed in my presence. The recorded information has been reviewed and is accurate.    Filed Vitals:   08/15/15 1821 08/15/15 2023  BP: 197/102 182/110  Pulse: 102 88  Temp: 98.5 F (36.9 C)   TempSrc: Oral   Resp: 17 22  Height: 5\' 6"  (1.676 m)   Weight: 104.327 kg   SpO2: 94% 100%     Antony MaduraKelly Bryanah Sidell, PA-C 08/16/15 0052  Lorre NickAnthony Allen, MD 08/21/15 [phone]

## 2015-08-18 LAB — URINE CULTURE

## 2017-01-07 ENCOUNTER — Emergency Department (HOSPITAL_COMMUNITY)
Admission: EM | Admit: 2017-01-07 | Discharge: 2017-01-07 | Disposition: A | Discharge status: Home / Self Care | Payer: Medicaid Other | Attending: Emergency Medicine | Admitting: Emergency Medicine

## 2017-01-07 ENCOUNTER — Encounter (HOSPITAL_COMMUNITY): Payer: Self-pay | Admitting: Emergency Medicine

## 2017-01-07 ENCOUNTER — Other Ambulatory Visit: Payer: Self-pay

## 2017-01-07 DIAGNOSIS — Z79899 Other long term (current) drug therapy: Secondary | ICD-10-CM | POA: Insufficient documentation

## 2017-01-07 DIAGNOSIS — L02412 Cutaneous abscess of left axilla: Secondary | ICD-10-CM | POA: Insufficient documentation

## 2017-01-07 DIAGNOSIS — Z794 Long term (current) use of insulin: Secondary | ICD-10-CM | POA: Diagnosis not present

## 2017-01-07 DIAGNOSIS — E119 Type 2 diabetes mellitus without complications: Secondary | ICD-10-CM | POA: Insufficient documentation

## 2017-01-07 DIAGNOSIS — I10 Essential (primary) hypertension: Secondary | ICD-10-CM | POA: Insufficient documentation

## 2017-01-07 DIAGNOSIS — J45909 Unspecified asthma, uncomplicated: Secondary | ICD-10-CM | POA: Insufficient documentation

## 2017-01-07 DIAGNOSIS — R2232 Localized swelling, mass and lump, left upper limb: Secondary | ICD-10-CM | POA: Diagnosis present

## 2017-01-07 LAB — CBC
HEMATOCRIT: 38.6 % (ref 36.0–46.0)
HEMOGLOBIN: 13 g/dL (ref 12.0–15.0)
MCH: 28.9 pg (ref 26.0–34.0)
MCHC: 33.7 g/dL (ref 30.0–36.0)
MCV: 85.8 fL (ref 78.0–100.0)
Platelets: 261 10*3/uL (ref 150–400)
RBC: 4.5 MIL/uL (ref 3.87–5.11)
RDW: 13.5 % (ref 11.5–15.5)
WBC: 7.2 10*3/uL (ref 4.0–10.5)

## 2017-01-07 LAB — COMPREHENSIVE METABOLIC PANEL
ALT: 25 U/L (ref 14–54)
AST: 33 U/L (ref 15–41)
Albumin: 3.4 g/dL — ABNORMAL LOW (ref 3.5–5.0)
Alkaline Phosphatase: 125 U/L (ref 38–126)
Anion gap: 9 (ref 5–15)
BILIRUBIN TOTAL: 0.7 mg/dL (ref 0.3–1.2)
BUN: 11 mg/dL (ref 6–20)
CALCIUM: 9 mg/dL (ref 8.9–10.3)
CO2: 23 mmol/L (ref 22–32)
CREATININE: 0.94 mg/dL (ref 0.44–1.00)
Chloride: 102 mmol/L (ref 101–111)
GFR calc Af Amer: >60 mL/min (ref >60)
Glucose, Bld: 246 mg/dL — ABNORMAL HIGH (ref 65–99)
Potassium: 4 mmol/L (ref 3.5–5.1)
Sodium: 134 mmol/L — ABNORMAL LOW (ref 135–145)
TOTAL PROTEIN: 7.9 g/dL (ref 6.5–8.1)

## 2017-01-07 LAB — LIPASE, BLOOD: Lipase: 31 U/L (ref 11–51)

## 2017-01-07 MED ORDER — LIDOCAINE HCL (PF) 1 % IJ SOLN
5.0000 mL | Freq: Once | INTRAMUSCULAR | Status: AC
  Administered 2017-01-07: 5 mL via INTRADERMAL
  Filled 2017-01-07: qty 5

## 2017-01-07 MED ORDER — SULFAMETHOXAZOLE-TRIMETHOPRIM 800-160 MG PO TABS: 2.0000 | ORAL_TABLET | Freq: Two times a day (BID) | ORAL | 0 refills | Status: DC

## 2017-01-07 MED ORDER — HYDROCODONE-ACETAMINOPHEN 5-325 MG PO TABS: ORAL_TABLET | ORAL | 0 refills | Status: DC

## 2017-01-07 NOTE — ED Provider Notes (Signed)
[facility] Provider Note   CSN: [id number] Arrival date & time: 01/07/17  1051     History   Chief Complaint Chief Complaint  Patient presents with  . Abscess     HPI  Blood pressure (!) 141/99, pulse 82, temperature 98.4 F (36.9 C), temperature source Oral, resp. rate 18, SpO2 98 %.  Crystal Boyer is a 56 y.o. female complaining of left axillary pain and swelling worsening over the course of the last several days.  She states that similar to prior abscesses which she has had greater than 10 years ago.  She denies any fevers, chills, nausea, vomiting.  She has been taking amoxicillin for the abscess but that is causing her to have diarrhea.  Pain is severe and exacerbated by movement and palpation, over-the-counter pain medications taken with little relief.   Past Medical History:  Diagnosis Date  . Allergy   . Asthma   . Depression   . Diabetes mellitus   . Gout 12 days ago  . Hypertension     Patient Active Problem List   Diagnosis Date Noted  . DIABETES MELLITUS, TYPE II 11/27/2006  . HYPERTENSION 11/27/2006  . 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 ABDOMINAL HYSTERECTOMY    . ANKLE FRACTURE SURGERY     right ankle  . CESAREAN SECTION      OB History    No data available       Home Medications    Prior to Admission medications   Medication Sig Start Date End Date Taking? Authorizing Provider  acetaminophen (TYLENOL) 500 MG tablet Take 500 mg by mouth 3 (three) times daily as needed for mild pain.   Yes [provider]  albuterol (PROVENTIL HFA;VENTOLIN HFA) 108 (90 BASE) MCG/ACT inhaler Inhale 2 puffs into the lungs every 6 (six) hours as needed. Wheezing and shortness of breath   Yes [provider]  amoxicillin-clavulanate (AUGMENTIN) 875-125 MG tablet Take 1 tablet by mouth 2 (two) times daily. FOR 10 DAYS 01/03/17   [provider]   cephALEXin (KEFLEX) 500 MG capsule Take 1 capsule (500 mg total) by mouth 2 (two) times daily. 01/03/15   Little, Ambrose Finlandachel Morgan, MD  fluconazole (DIFLUCAN) 150 MG tablet Take 1 tablet (150 mg total) by mouth once. Take 3 days after first dose if symptoms continue 01/03/15   Little, Ambrose Finlandachel Morgan, MD  glipiZIDE (GLUCOTROL) 10 MG tablet Take 10 mg by mouth daily with breakfast. 01/21/14   [provider]  HYDROcodone-acetaminophen (NORCO/VICODIN) 5-325 MG tablet Take 1-2 tablets by mouth every 6 hours as needed for pain. 01/07/17   Jaquelynn Wanamaker, Joni ReiningNicole, PA-C  indomethacin (INDOCIN) 25 MG capsule Take 1 capsule (25 mg total) by mouth 3 (three) times daily as needed for mild pain or moderate pain. 08/15/15   Antony MaduraHumes, Kelly, PA-C  insulin aspart (NOVOLOG) 100 UNIT/ML injection Inject 5 Units into the skin 3 (three) times daily before meals.     [provider]  insulin glargine (LANTUS) 100 UNIT/ML injection Inject 30 Units into the skin at bedtime.     [provider]  losartan (COZAAR) 50 MG tablet Take 50 mg by mouth. 01/17/14   [provider]  metFORMIN (GLUCOPHAGE) 1000 MG tablet Take 1,000 mg by mouth 2 (two) times daily with a meal.    [provider]  nystatin cream (MYCOSTATIN) Apply to affected area 2 times daily 08/15/15   Antony MaduraHumes, Kelly,  PA-C  sulfamethoxazole-trimethoprim (BACTRIM DS) 800-160 MG tablet Take 2 tablets by mouth 2 (two) times daily. 01/07/17   Jerrilyn Messinger, Joni ReiningNicole, PA-C    Family History Family History  Problem Relation Age of Onset  . Colon cancer Neg Hx     Social History Social History   Tobacco Use  . Smoking status: Never Smoker  . Smokeless tobacco: Never Used  Substance Use Topics  . Alcohol use: No    Alcohol/week: 0.0 oz    Comment: social  . Drug use: Yes    Types: Marijuana    Comment: 1x monthly     Allergies   Eggs or egg-derived products and Tramadol   Review of Systems Review of Systems  A complete review  of systems was obtained and all systems are negative except as noted in the HPI and PMH.    Physical Exam Updated Vital Signs BP (!) 141/99   Pulse 82   Temp 98.4 F (36.9 C) (Oral)   Resp 18   SpO2 98%   Physical Exam  Constitutional: She is oriented to person, place, and time. She appears well-developed and well-nourished. No distress.  HENT:  Head: Normocephalic and atraumatic.  Mouth/Throat: Oropharynx is clear and moist.  Eyes: Conjunctivae and EOM are normal. Pupils are equal, round, and reactive to light.  Neck: Normal range of motion.  Cardiovascular: Normal rate, regular rhythm and intact distal pulses.  Pulmonary/Chest: Effort normal and breath sounds normal.  Abdominal: Soft. There is no tenderness.  Musculoskeletal: Normal range of motion.  Neurological: She is alert and oriented to person, place, and time.  Skin: She is not diaphoretic.  Tender and indurated area consistent with sinus tract and left axilla with no surrounding cellulitis  Psychiatric: She has a normal mood and affect.  Nursing note and vitals reviewed.    ED Treatments / Results  Labs (all labs ordered are listed, but only abnormal results are displayed) Labs Reviewed  COMPREHENSIVE METABOLIC PANEL - Abnormal; Notable for the following components:      Result Value   Sodium 134 (*)    Glucose, Bld 246 (*)    Albumin 3.4 (*)    All other components within normal limits  LIPASE, BLOOD  CBC  URINALYSIS, ROUTINE W REFLEX MICROSCOPIC    EKG  EKG Interpretation None       Radiology No results found.  Procedures .Marland Kitchen.Incision and Drainage Date/Time: 01/07/2017 3:16 PM Performed by: Wynetta EmeryPisciotta, Hani Patnode, PA-C Authorized by: Wynetta EmeryPisciotta, Sinclaire Artiga, PA-C   Consent:    Consent obtained:  Verbal   Consent given by:  Patient Location:    Type:  Abscess   Location:  Trunk Pre-procedure details:    Skin preparation:  Antiseptic wash Anesthesia (see MAR for exact dosages):    Anesthesia method:   Local infiltration   Local anesthetic:  Lidocaine 1% w/o epi Procedure type:    Complexity:  Complex Procedure details:    Needle aspiration: no     Incision types:  Single straight   Scalpel blade:  11   Wound management:  Probed and deloculated   Drainage:  Bloody   Drainage amount:  Moderate   Wound treatment:  Wound left open   Packing materials:  None Post-procedure details:    Patient tolerance of procedure:  Tolerated well, no immediate complications    (including critical care time)  Medications Ordered in ED Medications  lidocaine (PF) (XYLOCAINE) 1 % injection 5 mL (5 mLs Intradermal Given 01/07/17 1430)  Initial Impression / Assessment and Plan / ED Course  I have reviewed the triage vital signs and the nursing notes.  Pertinent labs & imaging results that were available during my care of the patient were reviewed by me and considered in my medical decision making (see chart for details).     Vitals:   01/07/17 1057 01/07/17 1415  BP: (!) 152/93 (!) 141/99  Pulse: 88 82  Resp: 18   Temp: 98.4 F (36.9 C)   TempSrc: Oral   SpO2: 99% 98%    Medications  lidocaine (PF) (XYLOCAINE) 1 % injection 5 mL (5 mLs Intradermal Given 01/07/17 1430)    CASHAE WEICH is 56 y.o. female presenting with abscess to left axilla consistent with sinus tract, indurated.  I&D performed with no purulent drainage.  Patient will be taken off of amoxicillin and covered for MRSA with Bactrim.  Vicodin for pain control at home.  Evaluation does not show pathology that would require ongoing emergent intervention or inpatient treatment. Pt is hemodynamically stable and mentating appropriately. Discussed findings and plan with patient/guardian, who agrees with care plan. All questions answered. Return precautions discussed and outpatient follow up given.      Final Clinical Impressions(s) / ED Diagnoses   Final diagnoses:  Abscess of left axilla    ED Discharge Orders         Ordered    sulfamethoxazole-trimethoprim (BACTRIM DS) 800-160 MG tablet  2 times daily     01/07/17 1515    HYDROcodone-acetaminophen (NORCO/VICODIN) 5-325 MG tablet     01/07/17 1515       Quitman Norberto, Mardella Layman 01/07/17 1520    Little, Ambrose Finland, MD 01/07/17 [phone]

## 2017-01-07 NOTE — ED Triage Notes (Signed)
Pt to ER for evaluation of left axillary abscess x2, states onset approximately one month. States reddened, painful on palpation. States PCP placed patient on amoxicillin, states has been compliant regardless of the diarrhea it is causing her. States has diarrhea all day since starting the antibiotic. Reports nausea. Denies vomiting. Reports generalized abdominal discomfort. Pt in NAD at this time. VSS.

## 2017-01-07 NOTE — ED Notes (Signed)
ED Provider at bedside. 

## 2017-01-07 NOTE — Discharge Instructions (Signed)
Stop taking the amoxicillin and start taking Bactrim.  Take vicodin for breakthrough pain, do not drink alcohol, drive, care for children or do other critical tasks while taking vicodin.  Please follow with your primary care doctor in the next 2 days for a check-up. They must obtain records for further management.   Do not hesitate to return to the Emergency Department for any new, worsening or concerning symptoms.

## 2017-06-06 ENCOUNTER — Other Ambulatory Visit: Payer: Self-pay

## 2017-06-06 ENCOUNTER — Emergency Department (HOSPITAL_COMMUNITY)
Admission: EM | Admit: 2017-06-06 | Discharge: 2017-06-07 | Disposition: A | Discharge status: Home / Self Care | Payer: Medicaid Other | Attending: Emergency Medicine | Admitting: Emergency Medicine

## 2017-06-06 ENCOUNTER — Encounter (HOSPITAL_COMMUNITY): Payer: Self-pay | Admitting: Emergency Medicine

## 2017-06-06 DIAGNOSIS — Z794 Long term (current) use of insulin: Secondary | ICD-10-CM | POA: Diagnosis not present

## 2017-06-06 DIAGNOSIS — E119 Type 2 diabetes mellitus without complications: Secondary | ICD-10-CM | POA: Insufficient documentation

## 2017-06-06 DIAGNOSIS — Z79899 Other long term (current) drug therapy: Secondary | ICD-10-CM | POA: Insufficient documentation

## 2017-06-06 DIAGNOSIS — R21 Rash and other nonspecific skin eruption: Secondary | ICD-10-CM | POA: Diagnosis present

## 2017-06-06 DIAGNOSIS — J45909 Unspecified asthma, uncomplicated: Secondary | ICD-10-CM | POA: Insufficient documentation

## 2017-06-06 DIAGNOSIS — I10 Essential (primary) hypertension: Secondary | ICD-10-CM | POA: Diagnosis not present

## 2017-06-06 DIAGNOSIS — B029 Zoster without complications: Secondary | ICD-10-CM | POA: Diagnosis not present

## 2017-06-06 LAB — BASIC METABOLIC PANEL
Anion gap: 7 (ref 5–15)
BUN: 15 mg/dL (ref 6–20)
CO2: 25 mmol/L (ref 22–32)
CREATININE: 0.97 mg/dL (ref 0.44–1.00)
Calcium: 9 mg/dL (ref 8.9–10.3)
Chloride: 104 mmol/L (ref 101–111)
GFR calc Af Amer: >60 mL/min (ref >60)
Glucose, Bld: 292 mg/dL — ABNORMAL HIGH (ref 65–99)
Potassium: 3.8 mmol/L (ref 3.5–5.1)
SODIUM: 136 mmol/L (ref 135–145)

## 2017-06-06 LAB — CBC
HCT: 38.6 % (ref 36.0–46.0)
Hemoglobin: 13 g/dL (ref 12.0–15.0)
MCH: 28.6 pg (ref 26.0–34.0)
MCHC: 33.7 g/dL (ref 30.0–36.0)
MCV: 85 fL (ref 78.0–100.0)
PLATELETS: 235 10*3/uL (ref 150–400)
RBC: 4.54 MIL/uL (ref 3.87–5.11)
RDW: 13.3 % (ref 11.5–15.5)
WBC: 6.8 10*3/uL (ref 4.0–10.5)

## 2017-06-06 LAB — I-STAT BETA HCG BLOOD, ED (MC, WL, AP ONLY)

## 2017-06-06 LAB — URINALYSIS, ROUTINE W REFLEX MICROSCOPIC
BACTERIA UA: NONE SEEN
Bilirubin Urine: NEGATIVE
Glucose, UA: 50 mg/dL — AB
Hgb urine dipstick: NEGATIVE
KETONES UR: NEGATIVE mg/dL
LEUKOCYTES UA: NEGATIVE
Nitrite: NEGATIVE
PROTEIN: NEGATIVE mg/dL
Specific Gravity, Urine: 1.018 (ref 1.005–1.030)
pH: 6 (ref 5.0–8.0)

## 2017-06-06 NOTE — ED Triage Notes (Signed)
Pt presents with multiple complaints, sore to the right buttock x 1 month, generalized rash to back and chest after using a new body wash. Also c/o pain with urination.

## 2017-06-07 ENCOUNTER — Other Ambulatory Visit: Payer: Self-pay

## 2017-06-07 MED ORDER — HYDROCODONE-ACETAMINOPHEN 5-325 MG PO TABS: 2.0000 | ORAL_TABLET | Freq: Four times a day (QID) | ORAL | 0 refills | Status: DC | PRN

## 2017-06-07 NOTE — ED Provider Notes (Signed)
TIME SEEN: 3:25 AM  CHIEF COMPLAINT: Rash to the right buttock  HPI: Patient is a 57 year old female with history of hypertension, diabetes who presents to the emergency department with a rash to the right buttock that started 2 months ago.  States rash has been slightly pruritic but is a burning, stinging in nature.  She denies any new soaps, lotions, detergents or foods despite nursing notes.  No fever.  Has never had shingles before.  Has had history of chickenpox.  ROS: See HPI Constitutional: no fever  Eyes: no drainage  ENT: no runny nose   Cardiovascular:  no chest pain  Resp: no SOB  GI: no vomiting GU: no dysuria Integumentary: no rash  Allergy: no hives  Musculoskeletal: no leg swelling  Neurological: no slurred speech ROS otherwise negative  PAST MEDICAL HISTORY/PAST SURGICAL HISTORY:  Past Medical History:  Diagnosis Date  . Allergy   . Asthma   . Depression   . Diabetes mellitus   . Gout 12 days ago  . Hypertension     MEDICATIONS:  Prior to Admission medications   Medication Sig Start Date End Date Taking? Authorizing Provider  acetaminophen (TYLENOL) 500 MG tablet Take 500 mg by mouth 3 (three) times daily as needed for mild pain.   Yes [provider]  albuterol (PROVENTIL HFA;VENTOLIN HFA) 108 (90 BASE) MCG/ACT inhaler Inhale 2 puffs into the lungs every 6 (six) hours as needed for wheezing or shortness of breath.    Yes [provider]  amLODipine (NORVASC) 5 MG tablet Take 5 mg by mouth daily.   Yes [provider]  HYDROcodone-acetaminophen (NORCO/VICODIN) 5-325 MG tablet Take 1-2 tablets by mouth every 6 hours as needed for pain. 01/07/17  Yes Pisciotta, Joni Reining, PA-C  LANTUS SOLOSTAR 100 UNIT/ML Solostar Pen Inject 30 Units into the skin every evening.  10/25/16  Yes [provider]  losartan (COZAAR) 50 MG tablet Take 50 mg by mouth daily.   Yes [provider]  metFORMIN (GLUCOPHAGE) 1000 MG tablet Take 1,000 mg  by mouth 2 (two) times daily with a meal.   Yes [provider]    ALLERGIES:  Allergies  Allergen Reactions  . Eggs Or Egg-Derived Products Diarrhea  . Actos [Pioglitazone] Other (See Comments)    Made the patient feel like she was "going to die"  . Glipizide Other (See Comments)    Made patient feel like she was "going to pass out and die"  . Tramadol Rash    SOCIAL HISTORY:  Social History   Tobacco Use  . Smoking status: Never Smoker  . Smokeless tobacco: Never Used  Substance Use Topics  . Alcohol use: No    Alcohol/week: 0.0 oz    Comment: social    FAMILY HISTORY: Family History  Problem Relation Age of Onset  . Colon cancer Neg Hx     EXAM: BP (!) 154/107   Pulse 68   Temp 97.7 F (36.5 C) (Oral)   Resp 14   Ht  (1.676 m)   Wt 104.3 kg (230 lb)   SpO2 100%   BMI 37.12 kg/m  CONSTITUTIONAL: Alert and oriented and responds appropriately to questions. Well-appearing; well-nourished HEAD: Normocephalic EYES: Conjunctivae clear, pupils appear equal, EOMI ENT: normal nose; moist mucous membranes NECK: Supple, no meningismus, no nuchal rigidity, no LAD  CARD: RRR; S1 and S2 appreciated; no murmurs, no clicks, no rubs, no gallops RESP: Normal chest excursion without splinting or tachypnea; breath sounds clear and  equal bilaterally; no wheezes, no rhonchi, no rales, no hypoxia or respiratory distress, speaking full sentences ABD/GI: Normal bowel sounds; non-distended; soft, non-tender, no rebound, no guarding, no peritoneal signs, no hepatosplenomegaly BACK:  The back appears normal and is non-tender to palpation, there is no CVA tenderness EXT: Normal ROM in all joints; non-tender to palpation; no edema; normal capillary refill; no cyanosis, no calf tenderness or swelling    SKIN: Normal color for age and race; warm; patient with rash consistent with shingles to the right buttock with no open, active lesions.  She does have some excoriations present.   No redness or warmth.  No induration.  No fluctuance or drainage.  No hives.  No blisters or desquamation.  No petechiae or purpura. NEURO: Moves all extremities equally PSYCH: The patient's mood and manner are appropriate. Grooming and personal hygiene are appropriate.  MEDICAL DECISION MAKING: Patient here with rash consistent with shingles to the right buttock.  Most of the lesions have completely healed over at this time.  She has no systemic symptoms.  I suspect that she is experiencing neuropathic pain.  Will discharge with short course of Vicodin but have recommended close follow-up with her PCP as she may benefit from medication such as Lyrica, gabapentin.  At this time given her lesions have mostly healed I do not feel she is a candidate for steroids or antivirals.  No other sign of life-threatening rash on examination.  She is otherwise well-appearing.  At this time, I do not feel there is any life-threatening condition present. I have reviewed and discussed all results (EKG, imaging, lab, urine as appropriate) and exam findings with patient/family. I have reviewed nursing notes and appropriate previous records.  I feel the patient is safe to be discharged home without further emergent workup and can continue workup as an outpatient as needed. Discussed usual and customary return precautions. Patient/family verbalize understanding and are comfortable with this plan.  Outpatient follow-up has been provided if needed. All questions have been answered.     Jonnae Fonseca, Layla Maw, DO 06/07/17 [phone]

## 2017-06-12 ENCOUNTER — Other Ambulatory Visit: Payer: Self-pay | Admitting: *Deleted

## 2017-06-12 DIAGNOSIS — Z1231 Encounter for screening mammogram for malignant neoplasm of breast: Secondary | ICD-10-CM

## 2017-07-06 ENCOUNTER — Ambulatory Visit: Payer: Medicaid Other

## 2017-08-16 ENCOUNTER — Encounter (INDEPENDENT_AMBULATORY_CARE_PROVIDER_SITE_OTHER): Payer: Self-pay | Admitting: Family

## 2017-08-16 ENCOUNTER — Ambulatory Visit (INDEPENDENT_AMBULATORY_CARE_PROVIDER_SITE_OTHER): Payer: Medicaid Other | Admitting: Family

## 2017-08-16 DIAGNOSIS — M76821 Posterior tibial tendinitis, right leg: Secondary | ICD-10-CM

## 2017-08-16 NOTE — Progress Notes (Signed)
Office Visit Note   Patient: Crystal Boyer           Date of Birth: 11/23/1960           MRN: [id number] Visit Date: 08/16/2017              Requested by: Fleet Contras, MD [address] [place], [place] [address] PCP: Fleet Contras, MD  No chief complaint on file.     HPI: The patient is a 57 year old woman seen today for evaluation of right ankle pain for many months. States may have been ongoing up to one year now.   Does have a history of a trimalleolar ankle fracture on right per her report. Was surgically repaired by Dr. Nicholes Rough.  States has been told may need ankle fusion in the past.  No recent injury. No twisting. Global ankle pain. Worse with ambulation. No swelling. Has not tried any pain relievers. Wonders if an ankle brace would help.  Assessment & Plan: Visit Diagnoses:  1. Posterior tibial tendon dysfunction (PTTD) of right lower extremity     Plan: given a PTT brace. Is to trial Naproxen bid for pain and inflammation. Rest, ice. PTT brace with activities.   Follow-Up Instructions: Return in about 1 month (around 09/13/2017).   Right Ankle Exam   Tenderness  Right ankle tenderness location: posterior tibial tendon. Swelling: none  Range of Motion  The patient has normal right ankle ROM.  Muscle Strength  The patient has normal right ankle strength.  Tests  Anterior drawer: negative Varus tilt: negative  Other  Erythema: absent Pulse: present   Comments:  Able to do single limb heel raise, is painful      Patient is alert, oriented, no adenopathy, well-dressed, normal affect, normal respiratory effort.   Imaging: No results found. No images are attached to the encounter.  Labs: Lab Results  Component Value Date   HGBA1C (H) 07/24/2007    7.6 (NOTE)   The ADA recommends the following therapeutic goals for glycemic   control related to Hgb A1C measurement:   Goal of Therapy:   < 7.0% Hgb A1C   Action Suggested:  > 8.0% Hgb  A1C   Ref:  Diabetes Care, 22, Suppl. 1, 1999   LABURIC 7.4 (H) 04/01/2011   REPTSTATUS 08/18/2015 FINAL 08/15/2015   CULT MULTIPLE SPECIES PRESENT, SUGGEST RECOLLECTION (A) 08/15/2015   LABORGA ESCHERICHIA COLI 09/07/2010     Lab Results  Component Value Date   ALBUMIN 3.4 (L) 01/07/2017   ALBUMIN 3.4 (L) 01/03/2015   ALBUMIN 3.6 12/25/2012   LABURIC 7.4 (H) 04/01/2011    There is no height or weight on file to calculate BMI.  Orders:  No orders of the defined types were placed in this encounter.  No orders of the defined types were placed in this encounter.    Procedures: No procedures performed  Clinical Data: No additional findings.  ROS:  All other systems negative, except as noted in the HPI. Review of Systems  Constitutional: Negative for chills and fever.  Musculoskeletal: Positive for arthralgias.    Objective: Vital Signs: There were no vitals taken for this visit.  Specialty Comments:  No specialty comments available.  PMFS History: Patient Active Problem List   Diagnosis Date Noted  . DIABETES MELLITUS, TYPE II 11/27/2006  . HYPERTENSION 11/27/2006  . MICROALBUMINURIA 11/27/2006   Past Medical History:  Diagnosis Date  . Allergy   . Asthma   . Depression   .  Diabetes mellitus   . Gout 12 days ago  . Hypertension     Family History  Problem Relation Age of Onset  . Colon cancer Neg Hx     Past Surgical History:  Procedure Laterality Date  . ABDOMINAL HYSTERECTOMY    . ANKLE FRACTURE SURGERY     right ankle  . CESAREAN SECTION     Social History   Occupational History  . Not on file  Tobacco Use  . Smoking status: Never Smoker  . Smokeless tobacco: Never Used  Substance and Sexual Activity  . Alcohol use: No    Alcohol/week: 0.0 oz    Comment: social  . Drug use: Yes    Types: Marijuana    Comment: 1x monthly  . Sexual activity: Yes    Birth control/protection: Condom

## 2017-09-18 ENCOUNTER — Ambulatory Visit (INDEPENDENT_AMBULATORY_CARE_PROVIDER_SITE_OTHER): Payer: Medicaid Other | Admitting: Orthopedic Surgery

## 2017-09-18 ENCOUNTER — Encounter (INDEPENDENT_AMBULATORY_CARE_PROVIDER_SITE_OTHER): Payer: Self-pay | Admitting: Orthopedic Surgery

## 2017-09-18 VITALS — Ht 66.0 in | Wt 230.0 lb

## 2017-09-18 DIAGNOSIS — M76821 Posterior tibial tendinitis, right leg: Secondary | ICD-10-CM

## 2017-09-18 DIAGNOSIS — M6702 Short Achilles tendon (acquired), left ankle: Secondary | ICD-10-CM | POA: Diagnosis not present

## 2017-09-25 ENCOUNTER — Encounter (INDEPENDENT_AMBULATORY_CARE_PROVIDER_SITE_OTHER): Payer: Self-pay | Admitting: Orthopedic Surgery

## 2017-09-25 DIAGNOSIS — M6702 Short Achilles tendon (acquired), left ankle: Secondary | ICD-10-CM | POA: Insufficient documentation

## 2017-09-25 DIAGNOSIS — M76821 Posterior tibial tendinitis, right leg: Secondary | ICD-10-CM | POA: Insufficient documentation

## 2017-09-25 NOTE — Progress Notes (Signed)
Office Visit Note   Patient: Katina DegreeCourtney B Mussell           Date of Birth: 13-Feb-1960           MRN: [id number] Visit Date: 09/18/2017              Requested by: Fleet ContrasAvbuere, Edwin, MD [address] [place], [place] [id number] PCP: Fleet ContrasAvbuere, Edwin, MD  Chief Complaint  Patient presents with  . Right Ankle - Follow-up    PPTI      HPI: Patient is a 57 year old woman with posterior tibial tendon insufficiency of her left foot.  She states that brace did not help cause pain in the arch of her foot patient states she has pain over the lateral aspect of the ankle.  Patient complains of numbness over the ball of her foot she states it feels like there is something going wrong under the metatarsal heads.  Patient states that she has to get up 15 steps to get into her home and would like to live on a single level home.  Assessment & Plan: Visit Diagnoses:  1. Posterior tibial tendon dysfunction (PTTD) of right lower extremity     Plan: Patient was given instructions for heel cord stretching discussed the importance of proper glucose control recommend a stiff soled shoe to unload pressure beneath the ball of her foot.  Patient was given a note recommended ground level living.  Follow-Up Instructions: Return in about 4 weeks (around 10/16/2017).   Ortho Exam  Patient is alert, oriented, no adenopathy, well-dressed, normal affect, normal respiratory effort. Examination patient has a good dorsalis pedis pulse she does have heel cord tightness with dorsiflexion only to neutral she is currently wearing slippers most of her pain is beneath the metatarsal heads consistent with the Achilles contracture.  There are no open ulcers no cellulitis posterior tibial tendon is nontender to palpation at this time.  Imaging: No results found. No images are attached to the encounter.  Labs: Lab Results  Component Value Date   HGBA1C (H) 07/24/2007    7.6 (NOTE)   The ADA recommends the following  therapeutic goals for glycemic   control related to Hgb A1C measurement:   Goal of Therapy:   < 7.0% Hgb A1C   Action Suggested:  > 8.0% Hgb A1C   Ref:  Diabetes Care, 22, Suppl. 1, 1999   LABURIC 7.4 (H) 04/01/2011   REPTSTATUS 08/18/2015 FINAL 08/15/2015   CULT MULTIPLE SPECIES PRESENT, SUGGEST RECOLLECTION (A) 08/15/2015   LABORGA ESCHERICHIA COLI 09/07/2010     Lab Results  Component Value Date   ALBUMIN 3.4 (L) 01/07/2017   ALBUMIN 3.4 (L) 01/03/2015   ALBUMIN 3.6 12/25/2012   LABURIC 7.4 (H) 04/01/2011    Body mass index is 37.12 kg/m.  Orders:  No orders of the defined types were placed in this encounter.  No orders of the defined types were placed in this encounter.    Procedures: No procedures performed  Clinical Data: No additional findings.  ROS:  All other systems negative, except as noted in the HPI. Review of Systems  Objective: Vital Signs: Ht 5\' 6"  (1.676 m)   Wt 230 lb (104.3 kg)   BMI 37.12 kg/m   Specialty Comments:  No specialty comments available.  PMFS History: Patient Active Problem List   Diagnosis Date Noted  . DIABETES MELLITUS, TYPE II 11/27/2006  . HYPERTENSION 11/27/2006  . MICROALBUMINURIA 11/27/2006   Past Medical History:  Diagnosis Date  . Allergy   .  Asthma   . Depression   . Diabetes mellitus   . Gout 12 days ago  . Hypertension     Family History  Problem Relation Age of Onset  . Colon cancer Neg Hx     Past Surgical History:  Procedure Laterality Date  . ABDOMINAL HYSTERECTOMY    . ANKLE FRACTURE SURGERY     right ankle  . CESAREAN SECTION     Social History   Occupational History  . Not on file  Tobacco Use  . Smoking status: Never Smoker  . Smokeless tobacco: Never Used  Substance and Sexual Activity  . Alcohol use: No    Alcohol/week: 0.0 standard drinks    Comment: social  . Drug use: Yes    Types: Marijuana    Comment: 1x monthly  . Sexual activity: Yes    Birth control/protection: Condom

## 2017-10-16 ENCOUNTER — Ambulatory Visit: Payer: Medicaid Other | Admitting: Dietician

## 2017-10-16 ENCOUNTER — Ambulatory Visit (INDEPENDENT_AMBULATORY_CARE_PROVIDER_SITE_OTHER): Payer: Medicaid Other | Admitting: Orthopedic Surgery

## 2017-10-23 ENCOUNTER — Ambulatory Visit (INDEPENDENT_AMBULATORY_CARE_PROVIDER_SITE_OTHER): Payer: Medicaid Other | Admitting: Orthopedic Surgery

## 2017-10-30 ENCOUNTER — Ambulatory Visit (INDEPENDENT_AMBULATORY_CARE_PROVIDER_SITE_OTHER): Payer: Medicaid Other

## 2017-10-30 ENCOUNTER — Ambulatory Visit (INDEPENDENT_AMBULATORY_CARE_PROVIDER_SITE_OTHER): Payer: Medicaid Other | Admitting: Orthopedic Surgery

## 2017-10-30 ENCOUNTER — Encounter (INDEPENDENT_AMBULATORY_CARE_PROVIDER_SITE_OTHER): Payer: Self-pay | Admitting: Orthopedic Surgery

## 2017-10-30 VITALS — Ht 66.5 in | Wt 235.0 lb

## 2017-10-30 DIAGNOSIS — M2022 Hallux rigidus, left foot: Secondary | ICD-10-CM

## 2017-10-30 DIAGNOSIS — M25571 Pain in right ankle and joints of right foot: Secondary | ICD-10-CM

## 2017-10-30 DIAGNOSIS — M6702 Short Achilles tendon (acquired), left ankle: Secondary | ICD-10-CM | POA: Diagnosis not present

## 2017-10-30 DIAGNOSIS — M79672 Pain in left foot: Secondary | ICD-10-CM

## 2017-10-30 DIAGNOSIS — M7671 Peroneal tendinitis, right leg: Secondary | ICD-10-CM

## 2017-10-30 DIAGNOSIS — M6701 Short Achilles tendon (acquired), right ankle: Secondary | ICD-10-CM | POA: Diagnosis not present

## 2017-10-30 NOTE — Progress Notes (Signed)
Office Visit Note   Patient: Crystal Boyer           Date of Birth: 09/24/1960           MRN: [id number] Visit Date: 10/30/2017              Requested by: Fleet Contras, MD [address] [place], [place] [address] PCP: Fleet Contras, MD  Chief Complaint  Patient presents with  . Left Foot - Pain  . Right Ankle - Pain, Follow-up      HPI: Patient is a 57 year old woman with diabetes complains of neuropathic pain complains of burning pain across the forefoot of the left and lateral ankle pain and swelling on the right.  Assessment & Plan: Visit Diagnoses:  1. Pain in right ankle and joints of right foot   2. Pain in left foot   3. Achilles tendon contracture, left   4. Achilles tendon contracture, right   5. Peroneal tendinitis, right leg   6. Hallux rigidus, left foot     Plan: Patient was given instructions and demonstrated Achilles stretching to do 5 times a day a minute at a time for her Achilles contracture.  For the hallux rigidus patient was recommended to wear a stiff soled athletic shoe to unload the MTP joint.  Discussed the possibility of a gastrocnemius recession and fusion if we failed conservative therapy.  Patient may benefit from an intra-articular injection of the right ankle at follow-up.   Follow-Up Instructions: No follow-ups on file.   Ortho Exam  Patient is alert, oriented, no adenopathy, well-dressed, normal affect, normal respiratory effort. Examination patient has good pulses bilaterally.  She has a varus hindfoot she has dorsiflexion only to neutral bilaterally.  She has tenderness to palpation across the forefoot on the left and has 0 degrees of dorsiflexion of the great toe at about 10 degrees of plantarflexion with significant hallux rigidus.  Palpation around the MTP joint reproduces her pain.  Semination of the right ankle she has minimal tenderness to palpation of the peroneal tendons the posterior tibial tendon is nontender to palpation  she does have swelling over the lateral ankle joint line and has some tenderness to palpation.  There is no redness no cellulitis no signs of infection or gout.  Imaging: Xr Foot Complete Left  Result Date: 10/30/2017 3 view radiographs of the left foot shows some calcifications dorsally there is collapse of the MTP joint of the left great toe with bony spurs consistent with hallux rigidus.  No fractures.  Xr Ankle Complete Right  Result Date: 10/30/2017 3 view radiographs of the right ankle shows a congruent joint with arthritic changes calcifications within the soft tissue.  No images are attached to the encounter.  Labs: Lab Results  Component Value Date   HGBA1C (H) 07/24/2007    7.6 (NOTE)   The ADA recommends the following therapeutic goals for glycemic   control related to Hgb A1C measurement:   Goal of Therapy:   < 7.0% Hgb A1C   Action Suggested:  > 8.0% Hgb A1C   Ref:  Diabetes Care, 22, Suppl. 1, 1999   LABURIC 7.4 (H) 04/01/2011   REPTSTATUS 08/18/2015 FINAL 08/15/2015   CULT MULTIPLE SPECIES PRESENT, SUGGEST RECOLLECTION (A) 08/15/2015   LABORGA ESCHERICHIA COLI 09/07/2010     Lab Results  Component Value Date   ALBUMIN 3.4 (L) 01/07/2017   ALBUMIN 3.4 (L) 01/03/2015   ALBUMIN 3.6 12/25/2012   LABURIC 7.4 (H) 04/01/2011  Body mass index is 37.36 kg/m.  Orders:  Orders Placed This Encounter  Procedures  . XR Foot Complete Left  . XR Ankle Complete Right   No orders of the defined types were placed in this encounter.    Procedures: No procedures performed  Clinical Data: No additional findings.  ROS:  All other systems negative, except as noted in the HPI. Review of Systems  Objective: Vital Signs: Ht 5' 6.5" (1.689 m)   Wt 235 lb (106.6 kg)   BMI 37.36 kg/m   Specialty Comments:  No specialty comments available.  PMFS History: Patient Active Problem List   Diagnosis Date Noted  . Achilles tendon contracture, left 09/25/2017  .  Posterior tibial tendon dysfunction (PTTD) of right lower extremity 09/25/2017  . DIABETES MELLITUS, TYPE II 11/27/2006  . HYPERTENSION 11/27/2006  . MICROALBUMINURIA 11/27/2006   Past Medical History:  Diagnosis Date  . Allergy   . Asthma   . Depression   . Diabetes mellitus   . Gout 12 days ago  . Hypertension     Family History  Problem Relation Age of Onset  . Colon cancer Neg Hx     Past Surgical History:  Procedure Laterality Date  . ABDOMINAL HYSTERECTOMY    . ANKLE FRACTURE SURGERY     right ankle  . CESAREAN SECTION     Social History   Occupational History  . Not on file  Tobacco Use  . Smoking status: Never Smoker  . Smokeless tobacco: Never Used  Substance and Sexual Activity  . Alcohol use: No    Alcohol/week: 0.0 standard drinks    Comment: social  . Drug use: Yes    Types: Marijuana    Comment: 1x monthly  . Sexual activity: Yes    Birth control/protection: Condom

## 2017-11-06 ENCOUNTER — Ambulatory Visit: Payer: Medicaid Other | Admitting: Skilled Nursing Facility1

## 2017-11-27 ENCOUNTER — Ambulatory Visit (INDEPENDENT_AMBULATORY_CARE_PROVIDER_SITE_OTHER): Payer: Medicaid Other | Admitting: Orthopedic Surgery

## 2018-02-04 ENCOUNTER — Emergency Department (HOSPITAL_COMMUNITY): Payer: Medicaid Other

## 2018-02-04 ENCOUNTER — Emergency Department (HOSPITAL_COMMUNITY)
Admission: EM | Admit: 2018-02-04 | Discharge: 2018-02-04 | Disposition: A | Discharge status: Home / Self Care | Payer: Medicaid Other | Attending: Emergency Medicine | Admitting: Emergency Medicine

## 2018-02-04 ENCOUNTER — Other Ambulatory Visit: Payer: Self-pay

## 2018-02-04 DIAGNOSIS — I10 Essential (primary) hypertension: Secondary | ICD-10-CM | POA: Insufficient documentation

## 2018-02-04 DIAGNOSIS — B9689 Other specified bacterial agents as the cause of diseases classified elsewhere: Secondary | ICD-10-CM

## 2018-02-04 DIAGNOSIS — E119 Type 2 diabetes mellitus without complications: Secondary | ICD-10-CM | POA: Diagnosis not present

## 2018-02-04 DIAGNOSIS — Z794 Long term (current) use of insulin: Secondary | ICD-10-CM | POA: Insufficient documentation

## 2018-02-04 DIAGNOSIS — Z79899 Other long term (current) drug therapy: Secondary | ICD-10-CM | POA: Diagnosis not present

## 2018-02-04 DIAGNOSIS — N760 Acute vaginitis: Secondary | ICD-10-CM | POA: Diagnosis not present

## 2018-02-04 DIAGNOSIS — Y929 Unspecified place or not applicable: Secondary | ICD-10-CM | POA: Insufficient documentation

## 2018-02-04 DIAGNOSIS — Y33XXXA Other specified events, undetermined intent, initial encounter: Secondary | ICD-10-CM | POA: Diagnosis not present

## 2018-02-04 DIAGNOSIS — Y939 Activity, unspecified: Secondary | ICD-10-CM | POA: Insufficient documentation

## 2018-02-04 DIAGNOSIS — Y998 Other external cause status: Secondary | ICD-10-CM | POA: Insufficient documentation

## 2018-02-04 DIAGNOSIS — J45909 Unspecified asthma, uncomplicated: Secondary | ICD-10-CM | POA: Insufficient documentation

## 2018-02-04 DIAGNOSIS — S39012A Strain of muscle, fascia and tendon of lower back, initial encounter: Secondary | ICD-10-CM | POA: Diagnosis not present

## 2018-02-04 DIAGNOSIS — M545 Low back pain: Secondary | ICD-10-CM | POA: Diagnosis present

## 2018-02-04 LAB — URINALYSIS, ROUTINE W REFLEX MICROSCOPIC
BILIRUBIN URINE: NEGATIVE
GLUCOSE, UA: NEGATIVE mg/dL
HGB URINE DIPSTICK: NEGATIVE
Ketones, ur: NEGATIVE mg/dL
Leukocytes, UA: NEGATIVE
Nitrite: NEGATIVE
PH: 5 (ref 5.0–8.0)
Protein, ur: NEGATIVE mg/dL
SPECIFIC GRAVITY, URINE: 1.017 (ref 1.005–1.030)

## 2018-02-04 LAB — COMPREHENSIVE METABOLIC PANEL
ALBUMIN: 3.4 g/dL — AB (ref 3.5–5.0)
ALT: 18 U/L (ref 0–44)
ANION GAP: 11 (ref 5–15)
AST: 24 U/L (ref 15–41)
Alkaline Phosphatase: 90 U/L (ref 38–126)
BUN: 9 mg/dL (ref 6–20)
CO2: 27 mmol/L (ref 22–32)
Calcium: 9.3 mg/dL (ref 8.9–10.3)
Chloride: 104 mmol/L (ref 98–111)
Creatinine, Ser: 0.96 mg/dL (ref 0.44–1.00)
GFR calc Af Amer: >60 mL/min (ref >60)
GFR calc non Af Amer: >60 mL/min (ref >60)
GLUCOSE: 86 mg/dL (ref 70–99)
POTASSIUM: 3.5 mmol/L (ref 3.5–5.1)
SODIUM: 142 mmol/L (ref 135–145)
Total Bilirubin: 0.8 mg/dL (ref 0.3–1.2)
Total Protein: 8 g/dL (ref 6.5–8.1)

## 2018-02-04 LAB — CBC
HEMATOCRIT: 41.4 % (ref 36.0–46.0)
HEMOGLOBIN: 12.8 g/dL (ref 12.0–15.0)
MCH: 26.8 pg (ref 26.0–34.0)
MCHC: 30.9 g/dL (ref 30.0–36.0)
MCV: 86.6 fL (ref 80.0–100.0)
Platelets: 255 10*3/uL (ref 150–400)
RBC: 4.78 MIL/uL (ref 3.87–5.11)
RDW: 13.2 % (ref 11.5–15.5)
WBC: 6 10*3/uL (ref 4.0–10.5)
nRBC: 0 % (ref 0.0–0.2)

## 2018-02-04 LAB — WET PREP, GENITAL
SPERM: NONE SEEN
Trich, Wet Prep: NONE SEEN
WBC WET PREP: NONE SEEN
YEAST WET PREP: NONE SEEN

## 2018-02-04 MED ORDER — METHOCARBAMOL 500 MG PO TABS: 500.0000 mg | ORAL_TABLET | Freq: Two times a day (BID) | ORAL | 0 refills | Status: DC

## 2018-02-04 MED ORDER — METRONIDAZOLE 500 MG PO TABS: 500.0000 mg | ORAL_TABLET | Freq: Two times a day (BID) | ORAL | 0 refills | Status: DC

## 2018-02-04 MED ORDER — DICLOFENAC SODIUM 1 % TD GEL: 2.0000 g | Freq: Four times a day (QID) | TRANSDERMAL | 0 refills | Status: DC

## 2018-02-04 NOTE — ED Provider Notes (Signed)
[facility] Provider Note   CSN: [id number] Arrival date & time: 02/04/18  [id number]     History   Chief Complaint Chief Complaint  Patient presents with  . Back Pain    HPI Crystal Boyer is a 57 y.o. female with a past medical history of hypertension, diabetes who presents to ED for multiple complaints. Her first complaint is lower back pain for the past 2 weeks.  She cannot recall any inciting event that may have triggered the symptoms.  No improvement with taking Tylenol intermittently.  Pain is aching, worse with palpation.  Denies any injuries or falls, loss of bowel or bladder function, urinary symptoms, prior back surgeries, history of cancer, she of IV drug use, chest pain, shortness of breath, abdominal pain. Her next complaint is odor and vaginal discharge.  Patient is concerned that her partner for 20 years may be having intercourse with someone else.  She denies any abnormal bleeding.  HPI  Past Medical History:  Diagnosis Date  . Allergy   . Asthma   . Depression   . Diabetes mellitus   . Gout 12 days ago  . Hypertension     Patient Active Problem List   Diagnosis Date Noted  . Achilles tendon contracture, left 09/25/2017  . Posterior tibial tendon dysfunction (PTTD) of right lower extremity 09/25/2017  . DIABETES MELLITUS, TYPE II 11/27/2006  . HYPERTENSION 11/27/2006  . 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 ABDOMINAL HYSTERECTOMY    . ANKLE FRACTURE SURGERY     right ankle  . CESAREAN SECTION       OB History   No obstetric history on file.      Home Medications    Prior to Admission medications   Medication Sig Start Date End Date Taking? Authorizing Provider  acetaminophen (TYLENOL) 500 MG tablet Take 500 mg by mouth 3 (three) times daily as needed for mild pain.   Yes [provider]  albuterol (PROVENTIL HFA;VENTOLIN HFA) 108 (90 BASE) MCG/ACT  inhaler Inhale 2 puffs into the lungs every 6 (six) hours as needed for wheezing or shortness of breath.    Yes [provider]  amLODipine (NORVASC) 5 MG tablet Take 5 mg by mouth daily.   Yes [provider]  LANTUS SOLOSTAR 100 UNIT/ML Solostar Pen Inject 50 Units into the skin every evening.  10/25/16  Yes [provider]  liraglutide (VICTOZA) 18 MG/3ML SOPN Inject 3 Units into the skin every morning.    Yes [provider]  losartan (COZAAR) 50 MG tablet Take 50 mg by mouth daily.   Yes [provider]  metFORMIN (GLUCOPHAGE) 1000 MG tablet Take 1,000 mg by mouth 2 (two) times daily with a meal.   Yes [provider]  diclofenac sodium (VOLTAREN) 1 % GEL Apply 2 g topically 4 (four) times daily. 02/04/18   Johnathen Testa, PA-C  HYDROcodone-acetaminophen (NORCO/VICODIN) 5-325 MG tablet Take 2 tablets by mouth every 6 (six) hours as needed. Patient not taking: Reported on 02/04/2018 06/07/17   Ward, Layla MawKristen N, DO  methocarbamol (ROBAXIN) 500 MG tablet Take 1 tablet (500 mg total) by mouth 2 (two) times daily. 02/04/18   Jeylin Woodmansee, PA-C  metroNIDAZOLE (FLAGYL) 500 MG tablet Take 1 tablet (500 mg total) by mouth 2 (two) times daily. 02/04/18   Dietrich PatesKhatri, Valerie Cones, PA-C    Family History Family History  Problem Relation Age of Onset  . Colon  cancer Neg Hx     Social History Social History   Tobacco Use  . Smoking status: Never Smoker  . Smokeless tobacco: Never Used  Substance Use Topics  . Alcohol use: No    Alcohol/week: 0.0 standard drinks    Comment: social  . Drug use: Yes    Types: Marijuana    Comment: 1x monthly     Allergies   Eggs or egg-derived products; Actos [pioglitazone]; Glipizide; and Tramadol   Review of Systems Review of Systems  Constitutional: Negative for appetite change, chills and fever.  HENT: Negative for ear pain, rhinorrhea, sneezing and sore throat.   Eyes: Negative for photophobia and visual  disturbance.  Respiratory: Negative for cough, chest tightness, shortness of breath and wheezing.   Cardiovascular: Negative for chest pain and palpitations.  Gastrointestinal: Negative for abdominal pain, blood in stool, constipation, diarrhea, nausea and vomiting.  Genitourinary: Positive for vaginal discharge. Negative for dysuria, hematuria and urgency.  Musculoskeletal: Positive for back pain and myalgias.  Skin: Negative for rash.  Neurological: Negative for dizziness, weakness and light-headedness.     Physical Exam Updated Vital Signs BP (!) 173/106   Pulse 68   Temp 97.6 F (36.4 C) (Oral)   Resp 16   Ht 5\' 6"  (1.676 m)   Wt 104.3 kg   SpO2 98%   BMI 37.12 kg/m   Physical Exam Vitals signs and nursing note reviewed.  Constitutional:      General: She is not in acute distress.    Appearance: She is well-developed.  HENT:     Head: Normocephalic and atraumatic.     Nose: Nose normal.  Eyes:     General: No scleral icterus.       Left eye: No discharge.     Conjunctiva/sclera: Conjunctivae normal.  Neck:     Musculoskeletal: Normal range of motion and neck supple.  Cardiovascular:     Rate and Rhythm: Normal rate and regular rhythm.     Heart sounds: Normal heart sounds. No murmur. No friction rub. No gallop.   Pulmonary:     Effort: Pulmonary effort is normal. No respiratory distress.     Breath sounds: Normal breath sounds.  Abdominal:     General: Bowel sounds are normal. There is no distension.     Palpations: Abdomen is soft.     Tenderness: There is no abdominal tenderness. There is no guarding.  Genitourinary:    Vagina: Vaginal discharge present.     Comments: Pelvic exam: normal external genitalia without evidence of trauma. VULVA: normal appearing vulva with no masses, tenderness or lesion. VAGINA: normal appearing vagina with normal color and scant white discharge, no lesions. CERVIX: normal appearing cervix without lesions, cervical motion  tenderness absent, cervical os closed without purulent discharge;  Wet prep and DNA probe for chlamydia and GC obtained.   ADNEXA: normal adnexa in size, nontender and no masses UTERUS: uterus is normal size, shape, consistency and nontender.   Whitney, RN served as Biomedical engineerchaperone during exam. Musculoskeletal: Normal range of motion.       Back:     Comments: No midline spinal tenderness present in thoracic or cervical spine. No step-off palpated. No visible bruising, edema or temperature change noted. No objective signs of numbness present. No saddle anesthesia. 2+ DP pulses bilaterally. Sensation intact to light touch. Strength 5/5 in bilateral lower extremities.  Skin:    General: Skin is warm and dry.     Findings: No rash.  Neurological:  Mental Status: She is alert.     Motor: No abnormal muscle tone.     Coordination: Coordination normal.      ED Treatments / Results  Labs (all labs ordered are listed, but only abnormal results are displayed) Labs Reviewed  WET PREP, GENITAL - Abnormal; Notable for the following components:      Result Value   Clue Cells Wet Prep HPF POC PRESENT (*)    All other components within normal limits  COMPREHENSIVE METABOLIC PANEL - Abnormal; Notable for the following components:   Albumin 3.4 (*)    All other components within normal limits  URINALYSIS, ROUTINE W REFLEX MICROSCOPIC - Abnormal; Notable for the following components:   APPearance HAZY (*)    All other components within normal limits  CBC  GC/CHLAMYDIA PROBE AMP ([facility]) NOT AT [facility]    EKG None  Radiology Dg Lumbar Spine Complete  Result Date: 02/04/2018 CLINICAL DATA:  Low back pain. EXAM: LUMBAR SPINE - COMPLETE 4+ VIEW COMPARISON:  Radiographs dated 03/27/2008 FINDINGS: There is no evidence of lumbar spine fracture. Alignment is normal. Intervertebral disc spaces are maintained. No appreciable facet arthritis. Aortic atherosclerosis. IMPRESSION: 1. Normal lumbar spine.  2. Aortic atherosclerosis. Electronically Signed   By: Francene Boyers M.D.   On: 02/04/2018 09:30    Procedures Procedures (including critical care time)  Medications Ordered in ED Medications - No data to display   Initial Impression / Assessment and Plan / ED Course  I have reviewed the triage vital signs and the nursing notes.  Pertinent labs & imaging results that were available during my care of the patient were reviewed by me and considered in my medical decision making (see chart for details).     Patient denies any concerning symptoms suggestive of cauda equina requiring urgent imaging at this time such as loss of sensation in the lower extremities, lower extremity weakness, loss of bowel or bladder control, saddle anesthesia, urinary retention, fever/chills, IVDU. Exam demonstrated no  weakness on exam today. No preceding injury or trauma to suggest acute fracture. Doubt pelvic or urinary pathology for patient's acute back pain, as patient denies urinary symptoms, has no evidence of infection on UA, has no CVA tenderness, history/pain not consistent with nephrolithiasis; pelvic exam revealed clue cells. Doubt AAA as cause of patient's back pain as patient lacks  major risk factors, had no abdominal TTP, and has symmetric and intact distal pulses. Patient given strict return precautions for any symptoms indicating worsening neurologic function in the lower extremities.  Will treat symptomatically for probable muscle strain or spasm.  Will treat with Flagyl for BV.  Patient is hemodynamically stable, in NAD, and able to ambulate in the ED. Evaluation does not show pathology that would require ongoing emergent intervention or inpatient treatment. I explained the diagnosis to the patient. Pain has been managed and has no complaints prior to discharge. Patient is comfortable with above plan and is stable for discharge at this time. All questions were answered prior to disposition. Strict  return precautions for returning to the ED were discussed. Encouraged follow up with PCP.    Portions of this note were generated with Scientist, clinical (histocompatibility and immunogenetics). Dictation errors may occur despite best attempts at proofreading.   Final Clinical Impressions(s) / ED Diagnoses   Final diagnoses:  Strain of lumbar region, initial encounter  Bacterial vaginosis    ED Discharge Orders         Ordered    metroNIDAZOLE (FLAGYL) 500  MG tablet  2 times daily     02/04/18 1001    methocarbamol (ROBAXIN) 500 MG tablet  2 times daily     02/04/18 1001    diclofenac sodium (VOLTAREN) 1 % GEL  4 times daily     02/04/18 1001           Dietrich Pates, PA-C 02/04/18 1001    Mesner, Barbara Cower, MD 02/04/18 1335

## 2018-02-04 NOTE — Discharge Instructions (Signed)
Return to ED for worsening symptoms, losing control of your bowels or bladder, injuries or falls, numbness in arms or legs.

## 2018-02-04 NOTE — ED Triage Notes (Signed)
Pt states that her back has been hurting for x2 weeks and progressively getting worse. Also states she has noticed an odor from her vaginal area.

## 2018-02-05 LAB — GC/CHLAMYDIA PROBE AMP (~~LOC~~) NOT AT ARMC
Chlamydia: NEGATIVE
Neisseria Gonorrhea: NEGATIVE

## 2018-03-20 ENCOUNTER — Ambulatory Visit (INDEPENDENT_AMBULATORY_CARE_PROVIDER_SITE_OTHER): Payer: Medicaid Other | Admitting: Orthopedic Surgery

## 2018-04-24 ENCOUNTER — Ambulatory Visit (HOSPITAL_COMMUNITY)
Admission: EM | Admit: 2018-04-24 | Discharge: 2018-04-24 | Disposition: A | Discharge status: Home / Self Care | Payer: Medicaid Other | Attending: Internal Medicine | Admitting: Internal Medicine

## 2018-04-24 ENCOUNTER — Other Ambulatory Visit: Payer: Self-pay

## 2018-04-24 ENCOUNTER — Encounter (HOSPITAL_COMMUNITY): Payer: Self-pay | Admitting: Emergency Medicine

## 2018-04-24 DIAGNOSIS — J069 Acute upper respiratory infection, unspecified: Secondary | ICD-10-CM | POA: Insufficient documentation

## 2018-04-24 DIAGNOSIS — B9789 Other viral agents as the cause of diseases classified elsewhere: Secondary | ICD-10-CM | POA: Insufficient documentation

## 2018-04-24 LAB — RESPIRATORY PANEL BY PCR

## 2018-04-24 MED ORDER — LOSARTAN POTASSIUM 50 MG PO TABS: 50.0000 mg | ORAL_TABLET | Freq: Every day | ORAL | 3 refills | Status: DC

## 2018-04-24 MED ORDER — CLONIDINE HCL 0.1 MG PO TABS
0.1000 mg | ORAL_TABLET | ORAL | Status: AC
  Administered 2018-04-24: 0.1 mg via ORAL

## 2018-04-24 MED ORDER — AMLODIPINE BESYLATE 5 MG PO TABS: 5.0000 mg | ORAL_TABLET | Freq: Every day | ORAL | 3 refills | Status: DC

## 2018-04-24 MED ORDER — ALBUTEROL SULFATE HFA 108 (90 BASE) MCG/ACT IN AERS: 2.0000 | INHALATION_SPRAY | Freq: Four times a day (QID) | RESPIRATORY_TRACT | 1 refills | Status: DC | PRN

## 2018-04-24 MED ORDER — CLONIDINE HCL 0.1 MG PO TABS
ORAL_TABLET | ORAL | Status: AC
  Filled 2018-04-24: qty 1

## 2018-04-24 NOTE — Discharge Instructions (Signed)
Please wear a mask while cough and minimize any unnecessary travel outside your home

## 2018-04-24 NOTE — ED Provider Notes (Signed)
[facility]    CSN: [id number] Arrival date & time: 04/24/18  1121     History   Chief Complaint Chief Complaint  Patient presents with  . Nausea  . URI    HPI Crystal Boyer is a 58 y.o. female.   59 year old female with history of hypertension and diabetes presents urgent care complaining of general weakness, occasional nausea, nasal/sinus congestion and elevated blood pressure.  The patient admits to having difficulty swallowing, speaking and moving this morning when she awoke.  Further questioning reveals that she was feeling "sluggish" but did not have trouble handling secretions.  She denies numbness or weakness in her extremities.  Her complaints of weakness are more accurately described as malaise.  However, the patient admits to some pain with deep inspiration as well shortness of breath with exertion.  The latter has been the case for more than 2 weeks.  She denies swelling in her extremities or history of immobility/long trips in a vehicle or history of chronic inflammatory state aside from diabetes.     Past Medical History:  Diagnosis Date  . Allergy   . Asthma   . Depression   . Diabetes mellitus   . Gout 12 days ago  . Hypertension     Patient Active Problem List   Diagnosis Date Noted  . Achilles tendon contracture, left 09/25/2017  . Posterior tibial tendon dysfunction (PTTD) of right lower extremity 09/25/2017  . DIABETES MELLITUS, TYPE II 11/27/2006  . HYPERTENSION 11/27/2006  . 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 ABDOMINAL HYSTERECTOMY    . ANKLE FRACTURE SURGERY     right ankle  . CESAREAN SECTION      OB History   No obstetric history on file.      Home Medications    Prior to Admission medications   Medication Sig Start Date End Date Taking? Authorizing Provider  acetaminophen (TYLENOL) 500 MG tablet Take 500 mg by mouth 3 (three) times daily as needed for mild pain.     [provider]  albuterol (PROVENTIL HFA;VENTOLIN HFA) 108 (90 Base) MCG/ACT inhaler Inhale 2 puffs into the lungs every 6 (six) hours as needed for wheezing or shortness of breath. 04/24/18   Arnaldo Natal, MD  amLODipine (NORVASC) 5 MG tablet Take 1 tablet (5 mg total) by mouth daily. 04/24/18 08/22/18  Arnaldo Natal, MD  diclofenac sodium (VOLTAREN) 1 % GEL Apply 2 g topically 4 (four) times daily. 02/04/18   Khatri, Hina, PA-C  HYDROcodone-acetaminophen (NORCO/VICODIN) 5-325 MG tablet Take 2 tablets by mouth every 6 (six) hours as needed. Patient not taking: Reported on 02/04/2018 06/07/17   Ward, Layla Maw, DO  LANTUS SOLOSTAR 100 UNIT/ML Solostar Pen Inject 50 Units into the skin every evening.  10/25/16   [provider]  liraglutide (VICTOZA) 18 MG/3ML SOPN Inject 3 Units into the skin every morning.     [provider]  losartan (COZAAR) 50 MG tablet Take 1 tablet (50 mg total) by mouth daily. 04/24/18 08/22/18  Arnaldo Natal, MD  metFORMIN (GLUCOPHAGE) 1000 MG tablet Take 1,000 mg by mouth 2 (two) times daily with a meal.    [provider]  methocarbamol (ROBAXIN) 500 MG tablet Take 1 tablet (500 mg total) by mouth 2 (two) times daily. 02/04/18   Khatri, Hina, PA-C  metroNIDAZOLE (FLAGYL) 500 MG tablet Take 1 tablet (500 mg total) by mouth 2 (two) times daily. Patient not  taking: Reported on 04/24/2018 02/04/18   Dietrich Pates, PA-C    Family History Family History  Problem Relation Age of Onset  . Hypertension Mother   . Diabetes Mother   . Hypertension Father   . Diabetes Father   . Colon cancer Neg Hx     Social History Social History   Tobacco Use  . Smoking status: Never Smoker  . Smokeless tobacco: Never Used  Substance Use Topics  . Alcohol use: No    Alcohol/week: 0.0 standard drinks    Comment: social  . Drug use: Yes    Types: Marijuana    Comment: 1x monthly     Allergies   Eggs or egg-derived products; Actos  [pioglitazone]; Glipizide; and Tramadol   Review of Systems Review of Systems  Constitutional: Positive for fever (Subjective). Negative for chills.  HENT: Positive for congestion. Negative for sore throat and tinnitus.   Eyes: Negative for redness.  Respiratory: Positive for cough, chest tightness (With cough) and shortness of breath.   Cardiovascular: Negative for chest pain and palpitations.  Gastrointestinal: Negative for abdominal pain, diarrhea, nausea and vomiting.  Genitourinary: Negative for dysuria, frequency and urgency.  Musculoskeletal: Negative for myalgias.  Skin: Negative for rash.       No lesions  Neurological: Positive for weakness.  Hematological: Does not bruise/bleed easily.  Psychiatric/Behavioral: Negative for suicidal ideas.     Physical Exam Triage Vital Signs ED Triage Vitals  Enc Vitals Group     BP 04/24/18 1225 (!) 203/99     Pulse Rate 04/24/18 1225 97     Resp 04/24/18 1225 18     Temp 04/24/18 1225 (!) 97.3 F (36.3 C)     Temp Source 04/24/18 1225 Temporal     SpO2 04/24/18 1225 98 %     Weight --      Height --      Head Circumference --      Peak Flow --      Pain Score 04/24/18 1226 5     Pain Loc --      Pain Edu? --      Excl. in GC? --    No data found.  Updated Vital Signs BP (!) 184/105 (BP Location: Left Arm)   Pulse 97   Temp (!) 97.3 F (36.3 C) (Temporal)   Resp 18   SpO2 98%   Visual Acuity Right Eye Distance:   Left Eye Distance:   Bilateral Distance:    Right Eye Near:   Left Eye Near:    Bilateral Near:     Physical Exam Vitals signs and nursing note reviewed.  Constitutional:      General: She is not in acute distress.    Appearance: She is well-developed.  HENT:     Head: Normocephalic and atraumatic.  Eyes:     General: No scleral icterus.    Conjunctiva/sclera: Conjunctivae normal.     Pupils: Pupils are equal, round, and reactive to light.  Neck:     Musculoskeletal: Normal range of motion  and neck supple.     Thyroid: No thyromegaly.     Vascular: No JVD.     Trachea: No tracheal deviation.  Cardiovascular:     Rate and Rhythm: Normal rate and regular rhythm.     Heart sounds: Normal heart sounds. No murmur. No friction rub. No gallop.   Pulmonary:     Effort: Pulmonary effort is normal.     Breath sounds: Normal breath  sounds.  Abdominal:     General: Bowel sounds are normal. There is no distension.     Palpations: Abdomen is soft.     Tenderness: There is no abdominal tenderness.  Musculoskeletal: Normal range of motion.  Lymphadenopathy:     Cervical: No cervical adenopathy.  Skin:    General: Skin is warm and dry.  Neurological:     General: No focal deficit present.     Mental Status: She is alert and oriented to person, place, and time.     Cranial Nerves: No cranial nerve deficit, dysarthria or facial asymmetry.     Sensory: Sensation is intact.     Motor: No weakness.     Coordination: Coordination normal.  Psychiatric:        Behavior: Behavior normal.        Thought Content: Thought content normal.        Judgment: Judgment normal.      UC Treatments / Results  Labs (all labs ordered are listed, but only abnormal results are displayed) Labs Reviewed  RESPIRATORY PANEL BY PCR    EKG None  Radiology No results found.  Procedures Procedures (including critical care time)  Medications Ordered in UC Medications  cloNIDine (CATAPRES) tablet 0.1 mg (0.1 mg Oral Given 04/24/18 1326)    Initial Impression / Assessment and Plan / UC Course  I have reviewed the triage vital signs and the nursing notes.  Pertinent labs & imaging results that were available during my care of the patient were reviewed by me and considered in my medical decision making (see chart for details).     It appears that all of her symptoms may be related to her uncontrolled blood pressure.  The patient has not been taking her medication for some time because she has  not gone to her primary care doctor.  Blood pressure has improved but is still not optimal following clonidine 0.2 mg administered in clinic.  I have refilled her antihypertensive medication.  All neurologic symptoms had resolved by the time of presentation to clinic.   Due to her complaints of shortness of breath, subjective fever and cough I have obtained a viral respiratory panel.  Importantly, the patient has no travel risk factors or known contact with individuals with exposure to novel coronavirus.  Very low likelihood of PE as the patient is not tachycardic nor does she have classical anginal chest pain.  Chest pain with cough is more likely bronchitic.   Final Clinical Impressions(s) / UC Diagnoses   Final diagnoses:  Viral URI with cough     Discharge Instructions     Please wear a mask while cough and minimize any unnecessary travel outside your home   ED Prescriptions    Medication Sig Dispense Auth. Provider   amLODipine (NORVASC) 5 MG tablet Take 1 tablet (5 mg total) by mouth daily. 30 tablet Arnaldo Natal, MD   albuterol (PROVENTIL HFA;VENTOLIN HFA) 108 (90 Base) MCG/ACT inhaler Inhale 2 puffs into the lungs every 6 (six) hours as needed for wheezing or shortness of breath. 1 Inhaler Arnaldo Natal, MD   losartan (COZAAR) 50 MG tablet Take 1 tablet (50 mg total) by mouth daily. 30 tablet Arnaldo Natal, MD     Controlled Substance Prescriptions [place] Controlled Substance Registry consulted? Not Applicable   Arnaldo Natal, MD 04/24/18 1538

## 2018-04-24 NOTE — ED Triage Notes (Signed)
Pt here for nausea and URI sx

## 2018-04-26 ENCOUNTER — Telehealth (HOSPITAL_COMMUNITY): Payer: Self-pay | Admitting: Emergency Medicine

## 2018-04-26 NOTE — Telephone Encounter (Signed)
Patient contacted and made aware of all results, all questions answered.   

## 2018-06-01 ENCOUNTER — Telehealth (INDEPENDENT_AMBULATORY_CARE_PROVIDER_SITE_OTHER): Payer: Self-pay | Admitting: Orthopedic Surgery

## 2018-06-01 NOTE — Telephone Encounter (Signed)
Patient left message on voice mail at 10:35am wanting to schedule appointment next week with Dr. Lajoyce Corners.  I called back, but no answer. Left message for patient to return call before our office closes at 12pm.

## 2018-06-06 ENCOUNTER — Ambulatory Visit (INDEPENDENT_AMBULATORY_CARE_PROVIDER_SITE_OTHER): Payer: Medicaid Other | Admitting: Family

## 2018-07-02 ENCOUNTER — Emergency Department (HOSPITAL_COMMUNITY)
Admission: EM | Admit: 2018-07-02 | Discharge: 2018-07-02 | Disposition: A | Discharge status: Home / Self Care | Payer: Medicaid Other | Attending: Emergency Medicine | Admitting: Emergency Medicine

## 2018-07-02 ENCOUNTER — Other Ambulatory Visit: Payer: Self-pay

## 2018-07-02 ENCOUNTER — Encounter (HOSPITAL_COMMUNITY): Payer: Self-pay | Admitting: Emergency Medicine

## 2018-07-02 DIAGNOSIS — J45909 Unspecified asthma, uncomplicated: Secondary | ICD-10-CM | POA: Insufficient documentation

## 2018-07-02 DIAGNOSIS — Z794 Long term (current) use of insulin: Secondary | ICD-10-CM | POA: Insufficient documentation

## 2018-07-02 DIAGNOSIS — I10 Essential (primary) hypertension: Secondary | ICD-10-CM

## 2018-07-02 DIAGNOSIS — R51 Headache: Secondary | ICD-10-CM | POA: Diagnosis not present

## 2018-07-02 DIAGNOSIS — E119 Type 2 diabetes mellitus without complications: Secondary | ICD-10-CM | POA: Insufficient documentation

## 2018-07-02 DIAGNOSIS — Z79899 Other long term (current) drug therapy: Secondary | ICD-10-CM | POA: Diagnosis not present

## 2018-07-02 DIAGNOSIS — M542 Cervicalgia: Secondary | ICD-10-CM | POA: Diagnosis present

## 2018-07-02 DIAGNOSIS — M62838 Other muscle spasm: Secondary | ICD-10-CM | POA: Diagnosis not present

## 2018-07-02 DIAGNOSIS — F329 Major depressive disorder, single episode, unspecified: Secondary | ICD-10-CM | POA: Insufficient documentation

## 2018-07-02 DIAGNOSIS — R519 Headache, unspecified: Secondary | ICD-10-CM

## 2018-07-02 LAB — CBC WITH DIFFERENTIAL/PLATELET
Abs Immature Granulocytes: 0.08 10*3/uL — ABNORMAL HIGH (ref 0.00–0.07)
Basophils Absolute: 0 10*3/uL (ref 0.0–0.1)
Basophils Relative: 0 %
Eosinophils Absolute: 0.2 10*3/uL (ref 0.0–0.5)
Eosinophils Relative: 3 %
HCT: 37.2 % (ref 36.0–46.0)
Hemoglobin: 11.9 g/dL — ABNORMAL LOW (ref 12.0–15.0)
Immature Granulocytes: 1 %
Lymphocytes Relative: 24 %
Lymphs Abs: 1.5 10*3/uL (ref 0.7–4.0)
MCH: 28.5 pg (ref 26.0–34.0)
MCHC: 32 g/dL (ref 30.0–36.0)
MCV: 89 fL (ref 80.0–100.0)
Monocytes Absolute: 0.4 10*3/uL (ref 0.1–1.0)
Monocytes Relative: 6 %
Neutro Abs: 4.1 10*3/uL (ref 1.7–7.7)
Neutrophils Relative %: 66 %
Platelets: 258 10*3/uL (ref 150–400)
RBC: 4.18 MIL/uL (ref 3.87–5.11)
RDW: 13.2 % (ref 11.5–15.5)
WBC: 6.4 10*3/uL (ref 4.0–10.5)
nRBC: 0 % (ref 0.0–0.2)

## 2018-07-02 LAB — BASIC METABOLIC PANEL
Anion gap: 8 (ref 5–15)
BUN: 15 mg/dL (ref 6–20)
CO2: 25 mmol/L (ref 22–32)
Calcium: 9 mg/dL (ref 8.9–10.3)
Chloride: 106 mmol/L (ref 98–111)
Creatinine, Ser: 0.98 mg/dL (ref 0.44–1.00)
GFR calc Af Amer: >60 mL/min (ref >60)
GFR calc non Af Amer: >60 mL/min (ref >60)
Glucose, Bld: 142 mg/dL — ABNORMAL HIGH (ref 70–99)
Potassium: 4.1 mmol/L (ref 3.5–5.1)
Sodium: 139 mmol/L (ref 135–145)

## 2018-07-02 MED ORDER — VALACYCLOVIR HCL 1 G PO TABS: 1000.0000 mg | ORAL_TABLET | Freq: Three times a day (TID) | ORAL | 0 refills | Status: AC

## 2018-07-02 MED ORDER — KETOROLAC TROMETHAMINE 30 MG/ML IJ SOLN
30.0000 mg | Freq: Once | INTRAMUSCULAR | Status: AC
  Administered 2018-07-02: 30 mg via INTRAVENOUS
  Filled 2018-07-02: qty 1

## 2018-07-02 MED ORDER — HYDROCODONE-ACETAMINOPHEN 5-325 MG PO TABS: 1.0000 | ORAL_TABLET | Freq: Four times a day (QID) | ORAL | 0 refills | Status: DC | PRN

## 2018-07-02 MED ORDER — LOSARTAN POTASSIUM 50 MG PO TABS: 50.0000 mg | ORAL_TABLET | Freq: Once | ORAL | Status: DC

## 2018-07-02 MED ORDER — CYCLOBENZAPRINE HCL 10 MG PO TABS: 10.0000 mg | ORAL_TABLET | Freq: Two times a day (BID) | ORAL | 0 refills | Status: DC | PRN

## 2018-07-02 MED ORDER — BUPIVACAINE HCL (PF) 0.25 % IJ SOLN
50.0000 mL | Freq: Once | INTRAMUSCULAR | Status: AC
  Administered 2018-07-02: 50 mL
  Filled 2018-07-02: qty 60

## 2018-07-02 MED ORDER — BUPIVACAINE HCL 0.5 % IJ SOLN: 50.0000 mL | Freq: Once | INTRAMUSCULAR | Status: DC

## 2018-07-02 MED ORDER — ORPHENADRINE CITRATE 30 MG/ML IJ SOLN
60.0000 mg | Freq: Once | INTRAMUSCULAR | Status: AC
  Administered 2018-07-02: 06:00:00 60 mg via INTRAMUSCULAR
  Filled 2018-07-02: qty 2

## 2018-07-02 MED ORDER — AMLODIPINE BESYLATE 5 MG PO TABS
5.0000 mg | ORAL_TABLET | Freq: Once | ORAL | Status: AC
  Administered 2018-07-02: 5 mg via ORAL
  Filled 2018-07-02: qty 1

## 2018-07-02 MED ORDER — HYDROMORPHONE HCL 1 MG/ML IJ SOLN
1.0000 mg | Freq: Once | INTRAMUSCULAR | Status: AC
  Administered 2018-07-02: 08:00:00 1 mg via INTRAVENOUS
  Filled 2018-07-02: qty 1

## 2018-07-02 NOTE — Discharge Instructions (Signed)
Start taking valacyclovir as prescribed.  This medication is for possible shingles infection.   Take ibuprofen 600 mg every 6 hours as needed for pain.  You can also alternate with extra strength Tylenol every 6 hours.  If your pain is severe, you can take hydrocodone but do not drive, drink alcohol, or operate heavy machinery while taking this medication.  Be aware this medication also contains Tylenol, do not exceed 4000 mg of Tylenol daily.  This medication can also cause constipation so take a stool softener with it.  You can take Flexeril as needed for muscle relaxation as prescribed, but this medication can also cause sedation so I usually recommend only taking it at night.  Apply ice or heat, whichever feels best, 20 minutes at a time 3-4 times daily.  Do some gentle stretching exercises.  You can also YouTube search neck physical therapy exercises.  Do these once or twice a day.  Follow-up with your primary care physician for reevaluation of symptoms.  If your pain persist you may need to follow-up with a neurologist.  Return to the emergency department immediately for any concerning signs or symptoms develop such as severe headaches, vision changes, persistent vomiting, facial droop, or weakness.  If your blood pressure (BP) was elevated on multiple readings during this visit above 130 for the top number or above 80 for the bottom number, please have this repeated by your primary care provider within one month. You can also check your blood pressure when you are out at a pharmacy or grocery store. Many have machines that will check your blood pressure.  If your blood pressure remains elevated, please follow-up with your PCP.

## 2018-07-02 NOTE — ED Provider Notes (Signed)
Received patient at signout from [facility].  Refer to provider note for full history and physical examination.  Briefly, patient is a 58 year old female with history of hypertension, diabetes, depression, asthma presents for evaluation of right-sided neck pain radiating to the scalp.  Pain is severe, reproducible on palpation.  Has been present for 3 days.  History of shingles 1 year ago.  Initially quite hypertensive.  No improvement with pain with occipital nerve block.  Plan for pain control and will discharge with valacyclovir for coverage for shingles.  Physical Exam  BP (!) 179/100   Pulse 78   Temp 97.7 F (36.5 C) (Oral)   Ht 5' 6.5" (1.689 m)   Wt 105.7 kg   SpO2 99%   BMI 37.04 kg/m   Physical Exam Vitals signs and nursing note reviewed.  Constitutional:      General: She is not in acute distress.    Appearance: She is well-developed.  HENT:     Head: Normocephalic and atraumatic.     Comments: Generalized tenderness to palpation of the right side of the scalp with no erythema, induration, crepitus, or deformity Eyes:     General:        Right eye: No discharge.        Left eye: No discharge.     Conjunctiva/sclera: Conjunctivae normal.  Neck:     Musculoskeletal: Neck supple.     Vascular: No JVD.     Trachea: No tracheal deviation.     Comments: No midline cervical spine tenderness, no deformity, crepitus, or step-off noted.  Right-sided paracervical muscle tenderness and spasm noted in the trapezius distribution Cardiovascular:     Rate and Rhythm: Normal rate.  Pulmonary:     Effort: Pulmonary effort is normal.  Abdominal:     General: There is no distension.  Skin:    General: Skin is warm and dry.     Findings: No erythema.  Neurological:     Mental Status: She is alert.  Psychiatric:        Behavior: Behavior normal.       MDM   8:00AM Patient received IV Toradol and Dilaudid as well as her home blood pressure medications.  On reevaluation she is  resting comfortably in no apparent distress.  Reports she is feeling much better, rates pain as 3/10 in severity and feels comfortable with discharge home.  Her blood pressure has normalized entirely.  Her range of motion has improved.  Discussed utility of muscle relaxers, hydrocodone.  Discussed side effects of these medications.  We will also discharge with course of valacyclovir for coverage for herpes zoster infection.  Recommend follow-up with PCP for reevaluation of symptoms and hypertension, possible referral to neurology.  Discussed strict ED return precautions. Patient verbalized understanding of and agreement with plan and is safe for discharge home at this time.        Jeanie Sewer, PA-C 07/02/18 8937    Cathren Laine, MD 07/02/18 [phone]

## 2018-07-02 NOTE — ED Triage Notes (Addendum)
Pt reports neck pain that started three days ago.  No known injury, states "I have taken tylenol after tylenol but it keeps getting worser."  Pt has hx of HNT, has not taken medication this morning.

## 2018-07-02 NOTE — ED Provider Notes (Signed)
[facility] EMERGENCY DEPARTMENT Provider Note   CSN: [id number] Arrival date & time: 07/02/18  0349    History   Chief Complaint Chief Complaint  Patient presents with  . Neck Pain    HPI Crystal Boyer is a 58 y.o. female with a history of hypertension, diabetes mellitus, depression, asthma, and gout who presents to the emergency department with a chief complaint of neck pain.  The patient endorses constant, worsening, constant right-sided neck pain.  She characterizes the pain as "burning, stinging".  Reports the pain goes up and over the right side of her head, causing a headache. She reports associated neck stiffness for the last 3 days.  She also reports an associated generalized weakness, bilateral blurred vision, and a mild decrease in her hearing.  She reports that she has been having some numbness in her left lower leg for the last few months, but reports this seems to have somewhat increased over the last few days.  No other focal new numbness or weakness.  She denies sore throat, chest pain, shortness of breath, cough, sinus pain or pressure, rash, dizziness, lightheadedness, nausea, vomiting, diarrhea, otorrhea, dental pain, urinary or fecal incontinence.   She reports that she discussed her symptoms with her pharmacist and was told that she was likely sleeping in a position that was causing pain in her neck.  However, she reports that she changed her pillow over the last few days with no improvement in her symptoms. She also reports that her blood sugars have "up and down" over the last few days.  Reports that she took 5 tablets of 500 mg of Tylenol over the last 24 hours without improvement in her symptoms.      The history is provided by the patient. No language interpreter was used.    Past Medical History:  Diagnosis Date  . Allergy   . Asthma   . Depression   . Diabetes mellitus   . Gout 12 days ago  . Hypertension     Patient Active  Problem List   Diagnosis Date Noted  . Achilles tendon contracture, left 09/25/2017  . Posterior tibial tendon dysfunction (PTTD) of right lower extremity 09/25/2017  . DIABETES MELLITUS, TYPE II 11/27/2006  . HYPERTENSION 11/27/2006  . 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 ABDOMINAL HYSTERECTOMY    . ANKLE FRACTURE SURGERY     right ankle  . CESAREAN SECTION       OB History   No obstetric history on file.      Home Medications    Prior to Admission medications   Medication Sig Start Date End Date Taking? Authorizing Provider  acetaminophen (TYLENOL) 500 MG tablet Take 500 mg by mouth 3 (three) times daily as needed for mild pain.   Yes [provider]  albuterol (PROVENTIL HFA;VENTOLIN HFA) 108 (90 Base) MCG/ACT inhaler Inhale 2 puffs into the lungs every 6 (six) hours as needed for wheezing or shortness of breath. 04/24/18  Yes Arnaldo Natal, MD  amLODipine (NORVASC) 5 MG tablet Take 1 tablet (5 mg total) by mouth daily. 04/24/18 08/22/18 Yes Arnaldo Natal, MD  glyBURIDE (DIABETA) 5 MG tablet Take 5 mg by mouth daily with breakfast.   Yes [provider]  LANTUS SOLOSTAR 100 UNIT/ML Solostar Pen Inject 60 Units into the skin every evening.  10/25/16  Yes [provider]  liraglutide (VICTOZA) 18 MG/3ML SOPN Inject 18 Units into the  skin every morning.    Yes [provider]  losartan (COZAAR) 50 MG tablet Take 1 tablet (50 mg total) by mouth daily. 04/24/18 08/22/18 Yes Arnaldo Natal, MD  diclofenac sodium (VOLTAREN) 1 % GEL Apply 2 g topically 4 (four) times daily. Patient not taking: Reported on 07/02/2018 02/04/18   Dietrich Pates, PA-C  HYDROcodone-acetaminophen (NORCO/VICODIN) 5-325 MG tablet Take 2 tablets by mouth every 6 (six) hours as needed. Patient not taking: Reported on 02/04/2018 06/07/17   Ward, Layla Maw, DO  methocarbamol (ROBAXIN) 500 MG tablet Take 1 tablet (500 mg total)  by mouth 2 (two) times daily. Patient not taking: Reported on 07/02/2018 02/04/18   Dietrich Pates, PA-C  metroNIDAZOLE (FLAGYL) 500 MG tablet Take 1 tablet (500 mg total) by mouth 2 (two) times daily. Patient not taking: Reported on 04/24/2018 02/04/18   Dietrich Pates, PA-C    Family History Family History  Problem Relation Age of Onset  . Hypertension Mother   . Diabetes Mother   . Hypertension Father   . Diabetes Father   . Colon cancer Neg Hx     Social History Social History   Tobacco Use  . Smoking status: Never Smoker  . Smokeless tobacco: Never Used  Substance Use Topics  . Alcohol use: No    Alcohol/week: 0.0 standard drinks    Comment: social  . Drug use: Yes    Types: Marijuana    Comment: 1x monthly     Allergies   Eggs or egg-derived products; Actos [pioglitazone]; Glipizide; and Tramadol   Review of Systems Review of Systems  Constitutional: Negative for activity change, chills and fever.  HENT: Negative for ear pain, facial swelling and sore throat.   Eyes: Negative for visual disturbance.  Respiratory: Negative for shortness of breath.   Cardiovascular: Negative for chest pain.  Gastrointestinal: Negative for abdominal pain, diarrhea, nausea and vomiting.  Genitourinary: Negative for dysuria and urgency.  Musculoskeletal: Positive for myalgias, neck pain and neck stiffness. Negative for arthralgias, back pain and gait problem.  Skin: Negative for rash and wound.  Allergic/Immunologic: Negative for immunocompromised state.  Neurological: Positive for weakness (generalized), numbness (left lower leg) and headaches. Negative for dizziness, syncope and light-headedness.  Psychiatric/Behavioral: Negative for confusion.   Physical Exam Updated Vital Signs BP (!) 168/99   Pulse 82   Temp 97.7 F (36.5 C) (Oral)   Ht 5' 6.5" (1.689 m)   Wt 105.7 kg   SpO2 99%   BMI 37.04 kg/m   Physical Exam Vitals signs and nursing note reviewed.  Constitutional:       General: She is not in acute distress.    Appearance: She is not ill-appearing, toxic-appearing or diaphoretic.     Comments: Tearful on exam. Appears very uncomfortable.   HENT:     Head: Normocephalic.     Comments: Reproducible pain to the right greater occipital nerve.  Reproducible tenderness to palpation extending throughout the entire right scalp.  No rashes or lesions.  Pain with movement of the auricle and tragus.    Left Ear: Tympanic membrane, ear canal and external ear normal.     Ears:     Comments: Right canal is mildly edematous. No lesions are noted. TM is normal. No mastoid tenderness or swelling.     Nose: Nose normal.  Eyes:     General: No scleral icterus.    Extraocular Movements: Extraocular movements intact.     Conjunctiva/sclera: Conjunctivae normal.     Pupils:  Pupils are equal, round, and reactive to light.  Neck:     Musculoskeletal: Neck supple. Pain with movement and torticollis present. No edema, erythema, injury or spinous process tenderness.     Vascular: No carotid bruit or JVD.     Trachea: Trachea and phonation normal.     Comments: Significantly decreased range of motion of the neck.  Cardiovascular:     Rate and Rhythm: Normal rate and regular rhythm.     Heart sounds: No murmur. No friction rub. No gallop.   Pulmonary:     Effort: Pulmonary effort is normal. No respiratory distress.     Breath sounds: No stridor. No wheezing, rhonchi or rales.  Chest:     Chest wall: No tenderness.  Abdominal:     General: There is no distension.     Palpations: Abdomen is soft. There is no mass.     Tenderness: There is no abdominal tenderness. There is no right CVA tenderness, left CVA tenderness, guarding or rebound.     Hernia: No hernia is present.  Lymphadenopathy:     Cervical: No cervical adenopathy.  Skin:    General: Skin is warm.     Findings: No rash.  Neurological:     Mental Status: She is alert and oriented to person, place, and time.      Comments: CN II-XII are grossly intact. 5/5 strength against resistance of the bilateral upper and lower extremities.  Patient endorsed subjective numbness to the left lower extremity, but sharp and light sensation is intact and equal throughout the bilateral lower extremities.   Psychiatric:        Behavior: Behavior normal.      ED Treatments / Results  Labs (all labs ordered are listed, but only abnormal results are displayed) Labs Reviewed  CBC WITH DIFFERENTIAL/PLATELET - Abnormal; Notable for the following components:      Result Value   Hemoglobin 11.9 (*)    Abs Immature Granulocytes 0.08 (*)    All other components within normal limits  BASIC METABOLIC PANEL - Abnormal; Notable for the following components:   Glucose, Bld 142 (*)    All other components within normal limits    EKG None  Radiology No results found.  Procedures .Nerve Block Date/Time: 07/02/2018 7:27 AM Performed by: Barkley BoardsMcDonald, Mianna Iezzi A, PA-C Authorized by: Barkley BoardsMcDonald, Caelynn Marshman A, PA-C   Consent:    Consent obtained:  Verbal   Consent given by:  Patient   Risks discussed:  Allergic reaction, infection, intravenous injection and bleeding   Alternatives discussed:  No treatment Indications:    Indications:  Pain relief Location:    Body area:  Head   Head nerve:  Greater occipital   Laterality:  Right Pre-procedure details:    Skin preparation:  Alcohol   Preparation: Patient was prepped and draped in usual sterile fashion   Skin anesthesia (see MAR for exact dosages):    Skin anesthesia method:  None Procedure details (see MAR for exact dosages):    Block needle gauge:  25 G   Anesthetic injected:  Bupivacaine 0.5% w/o epi   Steroid injected:  None   Additive injected:  None   Injection procedure:  Anatomic landmarks identified, incremental injection, negative aspiration for blood, introduced needle and anatomic landmarks palpated   Paresthesia:  None Post-procedure details:    Outcome:  Pain  unchanged   Patient tolerance of procedure:  Tolerated well, no immediate complications   (including critical care time)  Medications Ordered  in ED Medications  bupivacaine (PF) (MARCAINE) 0.25 % injection 50 mL (has no administration in time range)  ketorolac (TORADOL) 30 MG/ML injection 30 mg (has no administration in time range)  HYDROmorphone (DILAUDID) injection 1 mg (has no administration in time range)  losartan (COZAAR) tablet 50 mg (has no administration in time range)  amLODipine (NORVASC) tablet 5 mg (has no administration in time range)  orphenadrine (NORFLEX) injection 60 mg (60 mg Intramuscular Given 07/02/18 0543)     Initial Impression / Assessment and Plan / ED Course  I have reviewed the triage vital signs and the nursing notes.  Pertinent labs & imaging results that were available during my care of the patient were reviewed by me and considered in my medical decision making (see chart for details).        58 year old female with a history of hypertension, diabetes mellitus, depression, asthma, and gout presenting with right-sided neck pain and stiffness for the last 3 days.  She reports associated mild blurred vision and thinks her hearing is somewhat decreased.  She reports that she has been feeling generally weak.  She notes that her blood sugars have been labile over the last few days.  No fever or chills.  She did have herpes zoster last year, but no recent rashes.  She has been taking Tylenol without improvement.  Differential diagnosis includes vertebral artery dissection, aseptic meningitis, occipital neuralgia, early presentation of herpes zoster, or severe musculoskeletal spasms.   On arrival, she is very hypertensive, 219/116. Likely due to pain. Will give Norflex and plan to reassess. The patient was seen and independently evaluated by Dr. Preston Fleeting, attending physician.  Will plan to cover the patient for an early presentation of herpes zoster given that  symptoms are consistent with a C1 and C2 nerve roots. Will plan to cover the patient with Valtrex for possible early Zoster.   Glucose is 142 with normal bicarb and anion gap.  Hemoglobin is 11.9.  Labs are otherwise unremarkable.  On reevaluation, she reports no improvement in her pain.  She is agreeable to occipital nerve block.  Greater occipital nerve block performed on the right with bupivacaine.  The patient was rechecked approximately 15 to 20 minutes after occipital nerve block and reports no improvement in her pain.  However, blood pressure does appear to have improved to the 170/90s without other treatment.   Patient care transferred to [facility] at the end of my shift pending pain control and discharge to home with Valtrex. Could consider advanced imaging if patient remains markedly hypertensive throughout ER visit. Patient presentation, ED course, and plan of care discussed with review of all pertinent labs and imaging. Please see his/her note for further details regarding further ED course and disposition.   Final Clinical Impressions(s) / ED Diagnoses   Final diagnoses:  None    ED Discharge Orders    None       Barkley Boards, PA-C 07/02/18 0732    Dione Booze, MD 07/04/18 1536

## 2018-07-03 ENCOUNTER — Other Ambulatory Visit: Payer: Self-pay

## 2018-07-03 ENCOUNTER — Encounter (HOSPITAL_COMMUNITY): Payer: Self-pay

## 2018-07-03 ENCOUNTER — Emergency Department (HOSPITAL_COMMUNITY)
Admission: EM | Admit: 2018-07-03 | Discharge: 2018-07-03 | Disposition: A | Discharge status: Home / Self Care | Payer: Medicaid Other | Attending: Emergency Medicine | Admitting: Emergency Medicine

## 2018-07-03 DIAGNOSIS — M62838 Other muscle spasm: Secondary | ICD-10-CM | POA: Insufficient documentation

## 2018-07-03 DIAGNOSIS — Z79899 Other long term (current) drug therapy: Secondary | ICD-10-CM | POA: Diagnosis not present

## 2018-07-03 DIAGNOSIS — Z794 Long term (current) use of insulin: Secondary | ICD-10-CM | POA: Insufficient documentation

## 2018-07-03 DIAGNOSIS — J45909 Unspecified asthma, uncomplicated: Secondary | ICD-10-CM | POA: Insufficient documentation

## 2018-07-03 DIAGNOSIS — E119 Type 2 diabetes mellitus without complications: Secondary | ICD-10-CM | POA: Diagnosis not present

## 2018-07-03 DIAGNOSIS — I10 Essential (primary) hypertension: Secondary | ICD-10-CM | POA: Diagnosis not present

## 2018-07-03 DIAGNOSIS — M542 Cervicalgia: Secondary | ICD-10-CM | POA: Diagnosis present

## 2018-07-03 MED ORDER — IBUPROFEN 400 MG PO TABS
600.0000 mg | ORAL_TABLET | Freq: Once | ORAL | Status: AC
  Administered 2018-07-03: 22:00:00 600 mg via ORAL
  Filled 2018-07-03: qty 1

## 2018-07-03 MED ORDER — DIAZEPAM 5 MG PO TABS: 2.5000 mg | ORAL_TABLET | Freq: Four times a day (QID) | ORAL | 0 refills | Status: DC | PRN

## 2018-07-03 MED ORDER — DIAZEPAM 5 MG PO TABS
5.0000 mg | ORAL_TABLET | Freq: Once | ORAL | Status: AC
  Administered 2018-07-03: 21:00:00 5 mg via ORAL
  Filled 2018-07-03: qty 1

## 2018-07-03 NOTE — ED Triage Notes (Signed)
Pt arrives POV for eval of neck pain that radiates up to head onset yesterday. Was seen here yesterday for same. Denies hx of trauma/injury.

## 2018-07-03 NOTE — ED Provider Notes (Signed)
[facility] EMERGENCY DEPARTMENT Provider Note   CSN: [id number] Arrival date & time: 07/03/18  1903    History   Chief Complaint Chief Complaint  Patient presents with  . Neck Pain    HPI Crystal Boyer is a 58 y.o. female.     Patient seen yesterday for spasmodic right sided neck and facial pain. Pain is localized along the C1-C2 dermatome. Patient was started on valtrex for potential herpes zoster. Pain has not improved and patient returns for reassessment. Pain increased with rotation of neck, as well as lifting her right arm.    Neck Pain  Pain location:  R side Quality:  Stiffness Stiffness is present:  All day Pain radiates to:  Head Pain severity:  Moderate Pain is:  Same all the time Duration:  4 days Timing:  Constant Chronicity:  New Ineffective treatments:  NSAIDs Associated symptoms: headaches   Associated symptoms: no bladder incontinence, no bowel incontinence, no fever, no numbness, no photophobia, no visual change and no weakness     Past Medical History:  Diagnosis Date  . Allergy   . Asthma   . Depression   . Diabetes mellitus   . Gout 12 days ago  . Hypertension     Patient Active Problem List   Diagnosis Date Noted  . Achilles tendon contracture, left 09/25/2017  . Posterior tibial tendon dysfunction (PTTD) of right lower extremity 09/25/2017  . DIABETES MELLITUS, TYPE II 11/27/2006  . HYPERTENSION 11/27/2006  . 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 ABDOMINAL HYSTERECTOMY    . ANKLE FRACTURE SURGERY     right ankle  . CESAREAN SECTION       OB History   No obstetric history on file.      Home Medications    Prior to Admission medications   Medication Sig Start Date End Date Taking? Authorizing Provider  acetaminophen (TYLENOL) 500 MG tablet Take 500 mg by mouth every 6 (six) hours as needed for mild pain.    Yes [provider]  amLODipine  (NORVASC) 5 MG tablet Take 1 tablet (5 mg total) by mouth daily. 04/24/18 08/22/18 Yes Arnaldo Natal, MD  diclofenac sodium (VOLTAREN) 1 % GEL Apply 2 g topically 4 (four) times daily. Patient taking differently: Apply 2 g topically 4 (four) times daily as needed (for pain).  02/04/18  Yes Khatri, Hina, PA-C  glyBURIDE (DIABETA) 5 MG tablet Take 5 mg by mouth daily with breakfast.   Yes [provider]  HYDROcodone-acetaminophen (NORCO/VICODIN) 5-325 MG tablet Take 1 tablet by mouth every 6 (six) hours as needed for severe pain. 07/02/18  Yes Fawze, Mina A, PA-C  ibuprofen (ADVIL) 200 MG tablet Take 600 mg by mouth every 6 (six) hours as needed (for pain).   Yes [provider]  LANTUS SOLOSTAR 100 UNIT/ML Solostar Pen Inject 60 Units into the skin at bedtime.  10/25/16  Yes [provider]  liraglutide (VICTOZA) 18 MG/3ML SOPN Inject 18 Units into the skin every morning.    Yes [provider]  losartan (COZAAR) 50 MG tablet Take 1 tablet (50 mg total) by mouth daily. 04/24/18 08/22/18 Yes Arnaldo Natal, MD  albuterol (PROVENTIL HFA;VENTOLIN HFA) 108 (90 Base) MCG/ACT inhaler Inhale 2 puffs into the lungs every 6 (six) hours as needed for wheezing or shortness of breath. Patient not taking: Reported on 07/03/2018 04/24/18   Arnaldo Natal, MD  cyclobenzaprine (FLEXERIL) 10  MG tablet Take 1 tablet (10 mg total) by mouth 2 (two) times daily as needed. Patient taking differently: Take 10 mg by mouth 2 (two) times daily as needed for muscle spasms.  07/02/18   Fawze, Mina A, PA-C  diazepam (VALIUM) 5 MG tablet Take 0.5 tablets (2.5 mg total) by mouth every 6 (six) hours as needed for muscle spasms (spasms). May increase to one tablet every 6 hours if 2.5 mg is not effective. 07/03/18   Felicie MornSmith, Ravon Mortellaro, NP  methocarbamol (ROBAXIN) 500 MG tablet Take 1 tablet (500 mg total) by mouth 2 (two) times daily. Patient not taking: Reported on 07/03/2018 02/04/18   Dietrich PatesKhatri, Hina,  PA-C  metroNIDAZOLE (FLAGYL) 500 MG tablet Take 1 tablet (500 mg total) by mouth 2 (two) times daily. Patient not taking: Reported on 07/03/2018 02/04/18   Dietrich PatesKhatri, Hina, PA-C  valACYclovir (VALTREX) 1000 MG tablet Take 1 tablet (1,000 mg total) by mouth 3 (three) times daily for 7 days. 07/02/18 07/09/18  Jeanie SewerFawze, Mina A, PA-C    Family History Family History  Problem Relation Age of Onset  . Hypertension Mother   . Diabetes Mother   . Hypertension Father   . Diabetes Father   . Colon cancer Neg Hx     Social History Social History   Tobacco Use  . Smoking status: Never Smoker  . Smokeless tobacco: Never Used  Substance Use Topics  . Alcohol use: No    Alcohol/week: 0.0 standard drinks    Comment: social  . Drug use: Yes    Types: Marijuana    Comment: 1x monthly     Allergies   Eggs or egg-derived products; Actos [pioglitazone]; Glipizide; and Tramadol   Review of Systems Review of Systems  Constitutional: Negative for fever.  Eyes: Negative for photophobia.  Gastrointestinal: Negative for bowel incontinence.  Genitourinary: Negative for bladder incontinence.  Musculoskeletal: Positive for neck pain.  Neurological: Positive for headaches. Negative for weakness and numbness.  All other systems reviewed and are negative.    Physical Exam Updated Vital Signs BP (!) 159/98 (BP Location: Right Arm)   Pulse 85   Temp 98.2 F (36.8 C) (Oral)   Resp 15   Ht 5' 6.5" (1.689 m)   Wt 106 kg   SpO2 98%   BMI 37.15 kg/m   Physical Exam Vitals signs and nursing note reviewed.  Constitutional:      Appearance: Normal appearance.  HENT:     Head: Atraumatic.     Right Ear: Tympanic membrane normal.     Left Ear: Tympanic membrane normal.  Eyes:     Conjunctiva/sclera: Conjunctivae normal.  Neck:     Musculoskeletal: Neck supple. Pain with movement and muscular tenderness present. No erythema, crepitus or spinous process tenderness.   Cardiovascular:     Rate and  Rhythm: Normal rate and regular rhythm.  Pulmonary:     Effort: Pulmonary effort is normal.     Breath sounds: Normal breath sounds.  Abdominal:     Palpations: Abdomen is soft.  Musculoskeletal:        General: Tenderness present.  Skin:    General: Skin is warm and dry.  Neurological:     Mental Status: She is alert and oriented to person, place, and time.     Cranial Nerves: No cranial nerve deficit.     Sensory: No sensory deficit.  Psychiatric:        Mood and Affect: Mood normal.      ED Treatments /  Results  Labs (all labs ordered are listed, but only abnormal results are displayed) Labs Reviewed - No data to display  EKG None  Radiology No results found.  Procedures Procedures (including critical care time)  Medications Ordered in ED Medications  diazepam (VALIUM) tablet 5 mg (5 mg Oral Given 07/03/18 2051)  ibuprofen (ADVIL) tablet 600 mg (600 mg Oral Given 07/03/18 2222)     Initial Impression / Assessment and Plan / ED Course  I have reviewed the triage vital signs and the nursing notes.  Pertinent labs & imaging results that were available during my care of the patient were reviewed by me and considered in my medical decision making (see chart for details).        Patient with muscular neck pain.  No neurological deficits and normal neuro exam.  Supportive care and return precaution discussed. Appears safe for discharge at this time. Follow up as indicated in discharge paperwork.   Final Clinical Impressions(s) / ED Diagnoses   Final diagnoses:  Muscle spasms of neck    ED Discharge Orders         Ordered    diazepam (VALIUM) 5 MG tablet  Every 6 hours PRN     07/03/18 2309           Felicie Morn, NP 07/03/18 2319    Terrilee Files, MD 07/04/18 [phone]

## 2018-10-17 ENCOUNTER — Encounter (HOSPITAL_COMMUNITY): Payer: Self-pay

## 2018-10-17 ENCOUNTER — Other Ambulatory Visit: Payer: Self-pay

## 2018-10-17 ENCOUNTER — Ambulatory Visit (HOSPITAL_COMMUNITY)
Admission: EM | Admit: 2018-10-17 | Discharge: 2018-10-17 | Disposition: A | Discharge status: Home / Self Care | Payer: Medicaid Other | Attending: Emergency Medicine | Admitting: Emergency Medicine

## 2018-10-17 DIAGNOSIS — I10 Essential (primary) hypertension: Secondary | ICD-10-CM | POA: Diagnosis not present

## 2018-10-17 DIAGNOSIS — Z20822 Contact with and (suspected) exposure to covid-19: Secondary | ICD-10-CM

## 2018-10-17 DIAGNOSIS — Z20828 Contact with and (suspected) exposure to other viral communicable diseases: Secondary | ICD-10-CM | POA: Insufficient documentation

## 2018-10-17 DIAGNOSIS — Z760 Encounter for issue of repeat prescription: Secondary | ICD-10-CM | POA: Diagnosis not present

## 2018-10-17 MED ORDER — LOSARTAN POTASSIUM 50 MG PO TABS: 50.0000 mg | ORAL_TABLET | Freq: Every day | ORAL | 0 refills | Status: DC

## 2018-10-17 MED ORDER — AMLODIPINE BESYLATE 5 MG PO TABS: 5.0000 mg | ORAL_TABLET | Freq: Every day | ORAL | 0 refills | Status: DC

## 2018-10-17 NOTE — Discharge Instructions (Signed)
Person Under Monitoring Name: Crystal Boyer  Location: [place] [address]   Infection Prevention Recommendations for Individuals Confirmed to have, or Being Evaluated for, 2019 Novel Coronavirus (COVID-19) Infection Who Receive Care at Home  Individuals who are confirmed to have, or are being evaluated for, COVID-19 should follow the prevention steps below until a healthcare provider or local or state health department says they can return to normal activities.  Stay home except to get medical care You should restrict activities outside your home, except for getting medical care. Do not go to work, school, or public areas, and do not use public transportation or taxis.  Call ahead before visiting your doctor Before your medical appointment, call the healthcare provider and tell them that you have, or are being evaluated for, COVID-19 infection. This will help the healthcare providers office take steps to keep other people from getting infected. Ask your healthcare provider to call the local or state health department.  Monitor your symptoms Seek prompt medical attention if your illness is worsening (e.g., difficulty breathing). Before going to your medical appointment, call the healthcare provider and tell them that you have, or are being evaluated for, COVID-19 infection. Ask your healthcare provider to call the local or state health department.  Wear a facemask You should wear a facemask that covers your nose and mouth when you are in the same room with other people and when you visit a healthcare provider. People who live with or visit you should also wear a facemask while they are in the same room with you.  Separate yourself from other people in your home As much as possible, you should stay in a different room from other people in your home. Also, you should use a separate bathroom, if available.  Avoid sharing household items You should not  share dishes, drinking glasses, cups, eating utensils, towels, bedding, or other items with other people in your home. After using these items, you should wash them thoroughly with soap and water.  Cover your coughs and sneezes Cover your mouth and nose with a tissue when you cough or sneeze, or you can cough or sneeze into your sleeve. Throw used tissues in a lined trash can, and immediately wash your hands with soap and water for at least 20 seconds or use an alcohol-based hand rub.  Wash your [facility] your hands often and thoroughly with soap and water for at least 20 seconds. You can use an alcohol-based hand sanitizer if soap and water are not available and if your hands are not visibly dirty. Avoid touching your eyes, nose, and mouth with unwashed hands.   Prevention Steps for Caregivers and Household Members of Individuals Confirmed to have, or Being Evaluated for, COVID-19 Infection Being Cared for in the Home  If you live with, or provide care at home for, a person confirmed to have, or being evaluated for, COVID-19 infection please follow these guidelines to prevent infection:  Follow healthcare providers instructions Make sure that you understand and can help the patient follow any healthcare provider instructions for all care.  Provide for the patients basic needs You should help the patient with basic needs in the home and provide support for getting groceries, prescriptions, and other personal needs.  Monitor the patients symptoms If they are getting sicker, call his or her medical provider and tell them that the patient has, or is being evaluated for, COVID-19 infection. This will help the healthcare providers  office take steps to keep other people from getting infected. Ask the healthcare provider to call the local or state health department.  Limit the number of people who have contact with the patient If possible, have only one caregiver for the  patient. Other household members should stay in another home or place of residence. If this is not possible, they should stay in another room, or be separated from the patient as much as possible. Use a separate bathroom, if available. Restrict visitors who do not have an essential need to be in the home.  Keep older adults, very young children, and other sick people away from the patient Keep older adults, very young children, and those who have compromised immune systems or chronic health conditions away from the patient. This includes people with chronic heart, lung, or kidney conditions, diabetes, and cancer.  Ensure good ventilation Make sure that shared spaces in the home have good air flow, such as from an air conditioner or an opened window, weather permitting.  Wash your hands often Wash your hands often and thoroughly with soap and water for at least 20 seconds. You can use an alcohol based hand sanitizer if soap and water are not available and if your hands are not visibly dirty. Avoid touching your eyes, nose, and mouth with unwashed hands. Use disposable paper towels to dry your hands. If not available, use dedicated cloth towels and replace them when they become wet.  Wear a facemask and gloves Wear a disposable facemask at all times in the room and gloves when you touch or have contact with the patients blood, body fluids, and/or secretions or excretions, such as sweat, saliva, sputum, nasal mucus, vomit, urine, or feces.  Ensure the mask fits over your nose and mouth tightly, and do not touch it during use. Throw out disposable facemasks and gloves after using them. Do not reuse. Wash your hands immediately after removing your facemask and gloves. If your personal clothing becomes contaminated, carefully remove clothing and launder. Wash your hands after handling contaminated clothing. Place all used disposable facemasks, gloves, and other waste in a lined container before  disposing them with other household waste. Remove gloves and wash your hands immediately after handling these items.  Do not share dishes, glasses, or other household items with the patient Avoid sharing household items. You should not share dishes, drinking glasses, cups, eating utensils, towels, bedding, or other items with a patient who is confirmed to have, or being evaluated for, COVID-19 infection. After the person uses these items, you should wash them thoroughly with soap and water.  Wash laundry thoroughly Immediately remove and wash clothes or bedding that have blood, body fluids, and/or secretions or excretions, such as sweat, saliva, sputum, nasal mucus, vomit, urine, or feces, on them. Wear gloves when handling laundry from the patient. Read and follow directions on labels of laundry or clothing items and detergent. In general, wash and dry with the warmest temperatures recommended on the label.  Clean all areas the individual has used often Clean all touchable surfaces, such as counters, tabletops, doorknobs, bathroom fixtures, toilets, phones, keyboards, tablets, and bedside tables, every day. Also, clean any surfaces that may have blood, body fluids, and/or secretions or excretions on them. Wear gloves when cleaning surfaces the patient has come in contact with. Use a diluted bleach solution (e.g., dilute bleach with 1 part bleach and 10 parts water) or a household disinfectant with a label that says EPA-registered for coronaviruses. To make a  bleach solution at home, add 1 tablespoon of bleach to 1 quart (4 cups) of water. For a larger supply, add  cup of bleach to 1 gallon (16 cups) of water. Read labels of cleaning products and follow recommendations provided on product labels. Labels contain instructions for safe and effective use of the cleaning product including precautions you should take when applying the product, such as wearing gloves or eye protection and making sure you  have good ventilation during use of the product. Remove gloves and wash hands immediately after cleaning.  Monitor yourself for signs and symptoms of illness Caregivers and household members are considered close contacts, should monitor their health, and will be asked to limit movement outside of the home to the extent possible. Follow the monitoring steps for close contacts listed on the symptom monitoring form.   ? If you have additional questions, contact your local health department or call the epidemiologist on call at [phone] (available 24/7). ? This guidance is subject to change. For the most up-to-date guidance from [facility], please refer to their website: [email or web address]

## 2018-10-17 NOTE — ED Provider Notes (Signed)
[facility]    CSN: [id number] Arrival date & time: 10/17/18  0802      History   Chief Complaint Chief Complaint  Patient presents with   covid test    HPI Crystal Boyer is a 58 y.o. female history of hypertension, DM type II, asthma and allergies, presenting today for evaluation after COVID exposure.  Patient states that someone at her work has recently developed COVID and their work is closed temporarily.  She notes that she last had exposure to this person approximately 1 week ago.  She denies any symptoms including denying fevers chills body aches.  Denies cough, congestion and sore throat.  Has maintained normal appetite and normal energy level.  Denies other exposures.  Patient works at a daycare.  She has also been out of her blood pressure medicine for the past 1-2 weeks.  She has been unable to get into her primary care in order to get a refill.  She denies any symptoms of headache, vision change, chest pain, shortness of breath.  Is on amlodipine and losartan.  Denies issues with these medicines.  HPI  Past Medical History:  Diagnosis Date   Allergy    Asthma    Depression    Diabetes mellitus    Gout 12 days ago   Hypertension     Patient Active Problem List   Diagnosis Date Noted   Achilles tendon contracture, left 09/25/2017   Posterior tibial tendon dysfunction (PTTD) of right lower extremity 09/25/2017   DIABETES MELLITUS, TYPE II 11/27/2006   HYPERTENSION 11/27/2006   MICROALBUMINURIA 11/27/2006    Past Surgical History:  Procedure Laterality Date   ABDOMINAL HYSTERECTOMY     ANKLE FRACTURE SURGERY     right ankle   CESAREAN SECTION      OB History   No obstetric history on file.      Home Medications    Prior to Admission medications   Medication Sig Start Date End Date Taking? Authorizing Provider  acetaminophen (TYLENOL) 500 MG tablet Take 500 mg by mouth every 6 (six) hours as needed for mild pain.      [provider]  albuterol (PROVENTIL HFA;VENTOLIN HFA) 108 (90 Base) MCG/ACT inhaler Inhale 2 puffs into the lungs every 6 (six) hours as needed for wheezing or shortness of breath. Patient not taking: Reported on 07/03/2018 04/24/18   Arnaldo Natal, MD  amLODipine (NORVASC) 5 MG tablet Take 1 tablet (5 mg total) by mouth daily. 10/17/18 11/16/18  Cherilyn Sautter C, PA-C  cyclobenzaprine (FLEXERIL) 10 MG tablet Take 1 tablet (10 mg total) by mouth 2 (two) times daily as needed. Patient taking differently: Take 10 mg by mouth 2 (two) times daily as needed for muscle spasms.  07/02/18   Fawze, Mina A, PA-C  diazepam (VALIUM) 5 MG tablet Take 0.5 tablets (2.5 mg total) by mouth every 6 (six) hours as needed for muscle spasms (spasms). May increase to one tablet every 6 hours if 2.5 mg is not effective. 07/03/18   Felicie Morn, NP  diclofenac sodium (VOLTAREN) 1 % GEL Apply 2 g topically 4 (four) times daily. Patient taking differently: Apply 2 g topically 4 (four) times daily as needed (for pain).  02/04/18   Khatri, Hina, PA-C  glyBURIDE (DIABETA) 5 MG tablet Take 5 mg by mouth daily with breakfast.    [provider]  HYDROcodone-acetaminophen (NORCO/VICODIN) 5-325 MG tablet Take 1 tablet by mouth every 6 (six) hours as needed for severe  pain. 07/02/18   Michela PitcherFawze, Mina A, PA-C  ibuprofen (ADVIL) 200 MG tablet Take 600 mg by mouth every 6 (six) hours as needed (for pain).    [provider]  LANTUS SOLOSTAR 100 UNIT/ML Solostar Pen Inject 60 Units into the skin at bedtime.  10/25/16   [provider]  liraglutide (VICTOZA) 18 MG/3ML SOPN Inject 18 Units into the skin every morning.     [provider]  losartan (COZAAR) 50 MG tablet Take 1 tablet (50 mg total) by mouth daily. 10/17/18 11/16/18  Debbe Crumble C, PA-C  methocarbamol (ROBAXIN) 500 MG tablet Take 1 tablet (500 mg total) by mouth 2 (two) times daily. Patient not taking: Reported on 07/03/2018 02/04/18    Dietrich PatesKhatri, Hina, PA-C  metroNIDAZOLE (FLAGYL) 500 MG tablet Take 1 tablet (500 mg total) by mouth 2 (two) times daily. Patient not taking: Reported on 07/03/2018 02/04/18   Dietrich PatesKhatri, Hina, PA-C    Family History Family History  Problem Relation Age of Onset   Hypertension Mother    Diabetes Mother    Hypertension Father    Diabetes Father    Colon cancer Neg Hx     Social History Social History   Tobacco Use   Smoking status: Never Smoker   Smokeless tobacco: Never Used  Substance Use Topics   Alcohol use: No    Alcohol/week: 0.0 standard drinks    Comment: social   Drug use: Yes    Types: Marijuana    Comment: 1x monthly     Allergies   Eggs or egg-derived products, Actos [pioglitazone], Glipizide, and Tramadol   Review of Systems Review of Systems  Constitutional: Negative for activity change, appetite change, chills, fatigue and fever.  HENT: Negative for congestion, ear pain, rhinorrhea, sinus pressure, sore throat and trouble swallowing.   Eyes: Negative for discharge and redness.  Respiratory: Negative for cough, chest tightness and shortness of breath.   Cardiovascular: Negative for chest pain.  Gastrointestinal: Negative for abdominal pain, diarrhea, nausea and vomiting.  Musculoskeletal: Negative for myalgias.  Skin: Negative for rash.  Neurological: Negative for dizziness, light-headedness and headaches.     Physical Exam Triage Vital Signs ED Triage Vitals  Enc Vitals Group     BP 10/17/18 0816 (!) 165/110     Pulse Rate 10/17/18 0816 81     Resp 10/17/18 0816 18     Temp 10/17/18 0816 98.4 F (36.9 C)     Temp src --      SpO2 10/17/18 0816 99 %     Weight 10/17/18 0813 235 lb 8 oz (106.8 kg)     Height --      Head Circumference --      Peak Flow --      Pain Score 10/17/18 0813 0     Pain Loc --      Pain Edu? --      Excl. in GC? --    No data found.  Updated Vital Signs BP (!) 165/110 (BP Location: Right Arm)    Pulse 81     Temp 98.4 F (36.9 C)    Resp 18    Wt 235 lb 8 oz (106.8 kg)    SpO2 99%    BMI 37.44 kg/m   Visual Acuity Right Eye Distance:   Left Eye Distance:   Bilateral Distance:    Right Eye Near:   Left Eye Near:    Bilateral Near:     Physical Exam Vitals signs  and nursing note reviewed.  Constitutional:      General: She is not in acute distress.    Appearance: She is well-developed.  HENT:     Head: Normocephalic and atraumatic.  Eyes:     Conjunctiva/sclera: Conjunctivae normal.  Neck:     Musculoskeletal: Neck supple.  Cardiovascular:     Rate and Rhythm: Normal rate and regular rhythm.     Heart sounds: No murmur.  Pulmonary:     Effort: Pulmonary effort is normal. No respiratory distress.     Breath sounds: Normal breath sounds.     Comments: Breathing comfortably at rest, CTABL, no wheezing, rales or other adventitious sounds auscultated Abdominal:     Palpations: Abdomen is soft.     Tenderness: There is no abdominal tenderness.  Skin:    General: Skin is warm and dry.  Neurological:     Mental Status: She is alert.      UC Treatments / Results  Labs (all labs ordered are listed, but only abnormal results are displayed) Labs Reviewed  NOVEL CORONAVIRUS, NAA (HOSP ORDER, SEND-OUT TO REF LAB; TAT 18-24 HRS)    EKG   Radiology No results found.  Procedures Procedures (including critical care time)  Medications Ordered in UC Medications - No data to display  Initial Impression / Assessment and Plan / UC Course  I have reviewed the triage vital signs and the nursing notes.  Pertinent labs & imaging results that were available during my care of the patient were reviewed by me and considered in my medical decision making (see chart for details).     COVID swab pending.  Currently asymptomatic.  Monitoring for development of symptoms.  Will refill blood pressure medicine x1 month, advised to go ahead and set up appointment with PCP for further management  and refills of blood pressure.Discussed strict return precautions. Patient verbalized understanding and is agreeable with plan.  Final Clinical Impressions(s) / UC Diagnoses   Final diagnoses:  Close Exposure to Covid-19 Virus     Discharge Instructions        Person Under Monitoring Name: Crystal Boyer  Location: [place] [address]   Infection Prevention Recommendations for Individuals Confirmed to have, or Being Evaluated for, 2019 Novel Coronavirus (COVID-19) Infection Who Receive Care at Home  Individuals who are confirmed to have, or are being evaluated for, COVID-19 should follow the prevention steps below until a healthcare provider or local or state health department says they can return to normal activities.  Stay home except to get medical care You should restrict activities outside your home, except for getting medical care. Do not go to work, school, or public areas, and do not use public transportation or taxis.  Call ahead before visiting your doctor Before your medical appointment, call the healthcare provider and tell them that you have, or are being evaluated for, COVID-19 infection. This will help the healthcare providers office take steps to keep other people from getting infected. Ask your healthcare provider to call the local or state health department.  Monitor your symptoms Seek prompt medical attention if your illness is worsening (e.g., difficulty breathing). Before going to your medical appointment, call the healthcare provider and tell them that you have, or are being evaluated for, COVID-19 infection. Ask your healthcare provider to call the local or state health department.  Wear a facemask You should wear a facemask that covers your nose and mouth when you are in the same room  with other people and when you visit a healthcare provider. People who live with or visit you should also wear a facemask while they are in  the same room with you.  Separate yourself from other people in your home As much as possible, you should stay in a different room from other people in your home. Also, you should use a separate bathroom, if available.  Avoid sharing household items You should not share dishes, drinking glasses, cups, eating utensils, towels, bedding, or other items with other people in your home. After using these items, you should wash them thoroughly with soap and water.  Cover your coughs and sneezes Cover your mouth and nose with a tissue when you cough or sneeze, or you can cough or sneeze into your sleeve. Throw used tissues in a lined trash can, and immediately wash your hands with soap and water for at least 20 seconds or use an alcohol-based hand rub.  Wash your Union Pacific Corporationhands Wash your hands often and thoroughly with soap and water for at least 20 seconds. You can use an alcohol-based hand sanitizer if soap and water are not available and if your hands are not visibly dirty. Avoid touching your eyes, nose, and mouth with unwashed hands.   Prevention Steps for Caregivers and Household Members of Individuals Confirmed to have, or Being Evaluated for, COVID-19 Infection Being Cared for in the Home  If you live with, or provide care at home for, a person confirmed to have, or being evaluated for, COVID-19 infection please follow these guidelines to prevent infection:  Follow healthcare providers instructions Make sure that you understand and can help the patient follow any healthcare provider instructions for all care.  Provide for the patients basic needs You should help the patient with basic needs in the home and provide support for getting groceries, prescriptions, and other personal needs.  Monitor the patients symptoms If they are getting sicker, call his or her medical provider and tell them that the patient has, or is being evaluated for, COVID-19 infection. This will help the healthcare  providers office take steps to keep other people from getting infected. Ask the healthcare provider to call the local or state health department.  Limit the number of people who have contact with the patient  If possible, have only one caregiver for the patient.  Other household members should stay in another home or place of residence. If this is not possible, they should stay  in another room, or be separated from the patient as much as possible. Use a separate bathroom, if available.  Restrict visitors who do not have an essential need to be in the home.  Keep older adults, very young children, and other sick people away from the patient Keep older adults, very young children, and those who have compromised immune systems or chronic health conditions away from the patient. This includes people with chronic heart, lung, or kidney conditions, diabetes, and cancer.  Ensure good ventilation Make sure that shared spaces in the home have good air flow, such as from an air conditioner or an opened window, weather permitting.  Wash your hands often  Wash your hands often and thoroughly with soap and water for at least 20 seconds. You can use an alcohol based hand sanitizer if soap and water are not available and if your hands are not visibly dirty.  Avoid touching your eyes, nose, and mouth with unwashed hands.  Use disposable paper towels to dry your hands. If not  available, use dedicated cloth towels and replace them when they become wet.  Wear a facemask and gloves  Wear a disposable facemask at all times in the room and gloves when you touch or have contact with the patients blood, body fluids, and/or secretions or excretions, such as sweat, saliva, sputum, nasal mucus, vomit, urine, or feces.  Ensure the mask fits over your nose and mouth tightly, and do not touch it during use.  Throw out disposable facemasks and gloves after using them. Do not reuse.  Wash your hands immediately  after removing your facemask and gloves.  If your personal clothing becomes contaminated, carefully remove clothing and launder. Wash your hands after handling contaminated clothing.  Place all used disposable facemasks, gloves, and other waste in a lined container before disposing them with other household waste.  Remove gloves and wash your hands immediately after handling these items.  Do not share dishes, glasses, or other household items with the patient  Avoid sharing household items. You should not share dishes, drinking glasses, cups, eating utensils, towels, bedding, or other items with a patient who is confirmed to have, or being evaluated for, COVID-19 infection.  After the person uses these items, you should wash them thoroughly with soap and water.  Wash laundry thoroughly  Immediately remove and wash clothes or bedding that have blood, body fluids, and/or secretions or excretions, such as sweat, saliva, sputum, nasal mucus, vomit, urine, or feces, on them.  Wear gloves when handling laundry from the patient.  Read and follow directions on labels of laundry or clothing items and detergent. In general, wash and dry with the warmest temperatures recommended on the label.  Clean all areas the individual has used often  Clean all touchable surfaces, such as counters, tabletops, doorknobs, bathroom fixtures, toilets, phones, keyboards, tablets, and bedside tables, every day. Also, clean any surfaces that may have blood, body fluids, and/or secretions or excretions on them.  Wear gloves when cleaning surfaces the patient has come in contact with.  Use a diluted bleach solution (e.g., dilute bleach with 1 part bleach and 10 parts water) or a household disinfectant with a label that says EPA-registered for coronaviruses. To make a bleach solution at home, add 1 tablespoon of bleach to 1 quart (4 cups) of water. For a larger supply, add  cup of bleach to 1 gallon (16 cups) of  water.  Read labels of cleaning products and follow recommendations provided on product labels. Labels contain instructions for safe and effective use of the cleaning product including precautions you should take when applying the product, such as wearing gloves or eye protection and making sure you have good ventilation during use of the product.  Remove gloves and wash hands immediately after cleaning.  Monitor yourself for signs and symptoms of illness Caregivers and household members are considered close contacts, should monitor their health, and will be asked to limit movement outside of the home to the extent possible. Follow the monitoring steps for close contacts listed on the symptom monitoring form.   ? If you have additional questions, contact your local health department or call the epidemiologist on call at [phone] (available 24/7). ? This guidance is subject to change. For the most up-to-date guidance from [facility], please refer to their website: [email or web address].hu    ED Prescriptions    Medication Sig Dispense Auth. Provider   losartan (COZAAR) 50 MG tablet Take 1 tablet (50 mg total) by mouth daily. 30 tablet Jerelyn Trimarco, [place] C, PA-C  amLODipine (NORVASC) 5 MG tablet Take 1 tablet (5 mg total) by mouth daily. 30 tablet Victorious Kundinger, [place] C, PA-C     Controlled Substance Prescriptions [place] Controlled Substance Registry consulted? Not Applicable   Lew Dawes, [place] 10/17/18 [phone]

## 2018-10-17 NOTE — ED Triage Notes (Signed)
Pt states the office closed at work due to [employer] exposure.

## 2018-10-19 LAB — NOVEL CORONAVIRUS, NAA (HOSP ORDER, SEND-OUT TO REF LAB; TAT 18-24 HRS): SARS-CoV-2, NAA: NOT DETECTED

## 2019-01-13 ENCOUNTER — Ambulatory Visit (HOSPITAL_COMMUNITY)
Admission: EM | Admit: 2019-01-13 | Discharge: 2019-01-13 | Disposition: A | Discharge status: Home / Self Care | Payer: Medicaid Other | Attending: Family Medicine | Admitting: Family Medicine

## 2019-01-13 ENCOUNTER — Encounter (HOSPITAL_COMMUNITY): Payer: Self-pay

## 2019-01-13 ENCOUNTER — Other Ambulatory Visit: Payer: Self-pay

## 2019-01-13 DIAGNOSIS — Z20822 Contact with and (suspected) exposure to covid-19: Secondary | ICD-10-CM

## 2019-01-13 DIAGNOSIS — Z20828 Contact with and (suspected) exposure to other viral communicable diseases: Secondary | ICD-10-CM | POA: Insufficient documentation

## 2019-01-13 NOTE — Discharge Instructions (Addendum)
Your test result will be available on MyChart You must quarantine until the test result is available

## 2019-01-13 NOTE — ED Triage Notes (Signed)
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 Patient reports she is here for testing.

## 2019-01-13 NOTE — ED Provider Notes (Signed)
[facility]    CSN: [id number] Arrival date & time: 01/13/19  1018      History   Chief Complaint Chief Complaint  Patient presents with  . Covid Exposure    HPI Crystal Boyer is a 58 y.o. female.   HPI Patient is here for coronavirus testing.  She is a Pharmacist, hospital.  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  She feels well.  She feels she is at high risk for Covid complications because of her diabetes. Past Medical History:  Diagnosis Date  . Allergy   . Asthma   . Depression   . Diabetes mellitus   . Gout 12 days ago  . Hypertension     Patient Active Problem List   Diagnosis Date Noted  . Achilles tendon contracture, left 09/25/2017  . Posterior tibial tendon dysfunction (PTTD) of right lower extremity 09/25/2017  . DIABETES MELLITUS, TYPE II 11/27/2006  . HYPERTENSION 11/27/2006  . 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 ABDOMINAL HYSTERECTOMY    . ANKLE FRACTURE SURGERY     right ankle  . CESAREAN SECTION      OB History   No obstetric history on file.      Home Medications    Prior to Admission medications   Medication Sig Start Date End Date Taking? Authorizing Provider  acetaminophen (TYLENOL) 500 MG tablet Take 500 mg by mouth every 6 (six) hours as needed for mild pain.     [provider]  amLODipine (NORVASC) 5 MG tablet Take 1 tablet (5 mg total) by mouth daily. 10/17/18 11/16/18  Wieters, Hallie C, PA-C  diazepam (VALIUM) 5 MG tablet Take 0.5 tablets (2.5 mg total) by mouth every 6 (six) hours as needed for muscle spasms (spasms). May increase to one tablet every 6 hours if 2.5 mg is not effective. 07/03/18   Etta Quill, NP  diclofenac sodium (VOLTAREN) 1 % GEL Apply 2 g topically 4 (four) times daily. Patient taking differently: Apply 2 g topically 4 (four) times daily as needed (for pain).  02/04/18    Khatri, Hina, PA-C  glyBURIDE (DIABETA) 5 MG tablet Take 5 mg by mouth daily with breakfast.    [provider]  HYDROcodone-acetaminophen (NORCO/VICODIN) 5-325 MG tablet Take 1 tablet by mouth every 6 (six) hours as needed for severe pain. 07/02/18   Fawze, Mina A, PA-C  ibuprofen (ADVIL) 200 MG tablet Take 600 mg by mouth every 6 (six) hours as needed (for pain).    [provider]  LANTUS SOLOSTAR 100 UNIT/ML Solostar Pen Inject 60 Units into the skin at bedtime.  10/25/16   [provider]  liraglutide (VICTOZA) 18 MG/3ML SOPN Inject 18 Units into the skin every morning.     [provider]  losartan (COZAAR) 50 MG tablet Take 1 tablet (50 mg total) by mouth daily. 10/17/18 11/16/18  Wieters, Hallie C, PA-C  albuterol (PROVENTIL HFA;VENTOLIN HFA) 108 (90 Base) MCG/ACT inhaler Inhale 2 puffs into the lungs every 6 (six) hours as needed for wheezing or shortness of breath. Patient not taking: Reported on 07/03/2018 04/24/18 01/13/19  Harrie Foreman, MD    Family History Family History  Problem Relation Age of Onset  . Hypertension Mother   . Diabetes Mother   . Hypertension Father   . Diabetes Father   . Colon cancer Neg Hx  Social History Social History   Tobacco Use  . Smoking status: Never Smoker  . Smokeless tobacco: Never Used  Substance Use Topics  . Alcohol use: No    Alcohol/week: 0.0 standard drinks    Comment: social  . Drug use: Yes    Types: Marijuana    Comment: 1x monthly     Allergies   Eggs or egg-derived products, Actos [pioglitazone], Glipizide, and Tramadol   Review of Systems Review of Systems  Constitutional: Negative.  Negative for chills and fever.       No fever, body aches, Covid symptoms  HENT: Negative for ear pain and sore throat.   Eyes: Negative for pain and visual disturbance.  Respiratory: Negative for cough and shortness of breath.   Cardiovascular: Negative for chest pain and palpitations.   Gastrointestinal: Negative for abdominal pain and vomiting.  Genitourinary: Negative for dysuria and hematuria.  Musculoskeletal: Negative for arthralgias and back pain.  Skin: Negative for color change and rash.  Neurological: Negative for seizures and syncope.  All other systems reviewed and are negative.    Physical Exam Triage Vital Signs ED Triage Vitals  Enc Vitals Group     BP 01/13/19 1115 138/84     Pulse Rate 01/13/19 1115 80     Resp 01/13/19 1115 16     Temp 01/13/19 1115 97.7 F (36.5 C)     Temp Source 01/13/19 1115 Oral     SpO2 01/13/19 1115 100 %     Weight --      Height --      Head Circumference --      Peak Flow --      Pain Score 01/13/19 1114 0     Pain Loc --      Pain Edu? --      Excl. in GC? --    No data found.  Updated Vital Signs BP 138/84 (BP Location: Right Arm)   Pulse 80   Temp 97.7 F (36.5 C) (Oral)   Resp 16   SpO2 100%   Visual Acuity Right Eye Distance:   Left Eye Distance:   Bilateral Distance:    Right Eye Near:   Left Eye Near:    Bilateral Near:     Physical Exam Constitutional:      General: She is not in acute distress.    Appearance: Normal appearance. She is well-developed and normal weight.  HENT:     Head: Normocephalic and atraumatic.     Mouth/Throat:     Comments: Mask in place Eyes:     Conjunctiva/sclera: Conjunctivae normal.     Pupils: Pupils are equal, round, and reactive to light.  Neck:     Musculoskeletal: Normal range of motion.  Cardiovascular:     Rate and Rhythm: Normal rate.  Pulmonary:     Effort: Pulmonary effort is normal. No respiratory distress.  Abdominal:     General: There is no distension.     Palpations: Abdomen is soft.  Musculoskeletal: Normal range of motion.  Skin:    General: Skin is warm and dry.  Neurological:     Mental Status: She is alert.  Psychiatric:        Mood and Affect: Mood normal.        Behavior: Behavior normal.      UC Treatments / Results   Labs (all labs ordered are listed, but only abnormal results are displayed) Labs Reviewed  NOVEL CORONAVIRUS, NAA (HOSP ORDER, SEND-OUT TO  REF LAB; TAT 18-24 HRS)    EKG   Radiology No results found.  Procedures Procedures (including critical care time)  Medications Ordered in UC Medications - No data to display  Initial Impression / Assessment and Plan / UC Course  I have reviewed the triage vital signs and the nursing notes.  Pertinent labs & imaging results that were available during my care of the patient were reviewed by me and considered in my medical decision making (see chart for details).     Reviewed signs and symptoms to watch for.  Usual course of disease.  Test results Final Clinical Impressions(s) / UC Diagnoses   Final diagnoses:  Exposure to COVID-19 virus     Discharge Instructions     Your test result will be available on MyChart You must quarantine until the test result is available   ED Prescriptions    None     PDMP not reviewed this encounter.   Eustace MooreNelson, Pietrina Jagodzinski Sue, MD 01/13/19 1159

## 2019-01-14 LAB — NOVEL CORONAVIRUS, NAA (HOSP ORDER, SEND-OUT TO REF LAB; TAT 18-24 HRS): SARS-CoV-2, NAA: NOT DETECTED

## 2019-07-01 ENCOUNTER — Encounter (HOSPITAL_COMMUNITY): Payer: Self-pay | Admitting: Emergency Medicine

## 2019-07-01 ENCOUNTER — Other Ambulatory Visit: Payer: Self-pay

## 2019-07-01 ENCOUNTER — Ambulatory Visit (HOSPITAL_COMMUNITY)
Admission: EM | Admit: 2019-07-01 | Discharge: 2019-07-01 | Disposition: A | Discharge status: Home / Self Care | Payer: Medicaid Other | Attending: Family Medicine | Admitting: Family Medicine

## 2019-07-01 DIAGNOSIS — Z1152 Encounter for screening for COVID-19: Secondary | ICD-10-CM | POA: Diagnosis present

## 2019-07-01 DIAGNOSIS — J029 Acute pharyngitis, unspecified: Secondary | ICD-10-CM | POA: Insufficient documentation

## 2019-07-01 DIAGNOSIS — R5383 Other fatigue: Secondary | ICD-10-CM | POA: Insufficient documentation

## 2019-07-01 DIAGNOSIS — R519 Headache, unspecified: Secondary | ICD-10-CM | POA: Insufficient documentation

## 2019-07-01 DIAGNOSIS — H9202 Otalgia, left ear: Secondary | ICD-10-CM | POA: Insufficient documentation

## 2019-07-01 LAB — POCT RAPID STREP A: Streptococcus, Group A Screen (Direct): NEGATIVE

## 2019-07-01 LAB — SARS CORONAVIRUS 2 (TAT 6-24 HRS): SARS Coronavirus 2: NEGATIVE

## 2019-07-01 MED ORDER — CETIRIZINE HCL 10 MG PO TABS: 10.0000 mg | ORAL_TABLET | Freq: Every day | ORAL | 0 refills | Status: DC

## 2019-07-01 NOTE — ED Provider Notes (Signed)
[facility] CARE CENTER   [id number] 07/01/19 Arrival Time: 0801  IO:EVOJ THROAT  SUBJECTIVE: History from: patient.  Crystal Boyer is a 59 y.o. female who presents with abrupt onset of sore throat for 2 days with fatigue, R ear pain, and headache. 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 Has made no attempts to treat at home.  Symptoms are made worse with swallowing, but tolerating liquids and own secretions without difficulty. Denies previous symptoms in the past.     Denies fever, chills, sinus pain, rhinorrhea, nasal congestion, cough, SOB, wheezing, chest pain, nausea, rash, changes in bowel or bladder habits.     ROS: As per HPI.  All other pertinent ROS negative.     Past Medical History:  Diagnosis Date  . Allergy   . Asthma   . Depression   . Diabetes mellitus   . Gout 12 days ago  . Hypertension    Past Surgical History:  Procedure Laterality Date  . ABDOMINAL HYSTERECTOMY    . ANKLE FRACTURE SURGERY     right ankle  . CESAREAN SECTION     Allergies  Allergen Reactions  . Eggs Or Egg-Derived Products Diarrhea  . Actos [Pioglitazone] Other (See Comments)    Made the patient feel like she was "going to die"  . Glipizide Other (See Comments)    Made patient feel like she was "going to pass out and die"  . Tramadol Rash   No current facility-administered medications on file prior to encounter.   Current Outpatient Medications on File Prior to Encounter  Medication Sig Dispense Refill  . acetaminophen (TYLENOL) 500 MG tablet Take 500 mg by mouth every 6 (six) hours as needed for mild pain.     . diazepam (VALIUM) 5 MG tablet Take 0.5 tablets (2.5 mg total) by mouth every 6 (six) hours as needed for muscle spasms (spasms). May increase to one tablet every 6 hours if 2.5 mg is not effective. 10 tablet 0  . diclofenac sodium (VOLTAREN) 1 % GEL Apply 2 g topically 4 (four) times  daily. (Patient taking differently: Apply 2 g topically 4 (four) times daily as needed (for pain). ) 100 g 0  . glyBURIDE (DIABETA) 5 MG tablet Take 5 mg by mouth daily with breakfast.    . HYDROcodone-acetaminophen (NORCO/VICODIN) 5-325 MG tablet Take 1 tablet by mouth every 6 (six) hours as needed for severe pain. 10 tablet 0  . ibuprofen (ADVIL) 200 MG tablet Take 600 mg by mouth every 6 (six) hours as needed (for pain).    Marland Kitchen LANTUS SOLOSTAR 100 UNIT/ML Solostar Pen Inject 60 Units into the skin at bedtime.   5  . liraglutide (VICTOZA) 18 MG/3ML SOPN Inject 18 Units into the skin every morning.     Marland Kitchen amLODipine (NORVASC) 5 MG tablet Take 1 tablet (5 mg total) by mouth daily. 30 tablet 0  . losartan (COZAAR) 50 MG tablet Take 1 tablet (50 mg total) by mouth daily. 30 tablet 0  . [DISCONTINUED] albuterol (PROVENTIL HFA;VENTOLIN HFA) 108 (90 Base) MCG/ACT inhaler Inhale 2 puffs into the lungs every 6 (six) hours as needed for wheezing or shortness of breath. (Patient not taking: Reported on 07/03/2018) 1 Inhaler 1   Social History   Socioeconomic History  . Marital status: Single    Spouse name: Not on file  . Number of children: Not on file  . Years of education: Not on  file  . Highest education level: Not on file  Occupational History  . Not on file  Tobacco Use  . Smoking status: Never Smoker  . Smokeless tobacco: Never Used  Substance and Sexual Activity  . Alcohol use: No    Alcohol/week: 0.0 standard drinks    Comment: social  . Drug use: Yes    Types: Marijuana    Comment: 1x monthly  . Sexual activity: Yes    Birth control/protection: Condom  Other Topics Concern  . Not on file  Social History Narrative  . Not on file   Social Determinants of Health   Financial Resource Strain:   . Difficulty of Paying Living Expenses:   Food Insecurity:   . Worried About Programme researcher, broadcasting/film/video in the Last Year:   . Barista in the Last Year:   Transportation Needs:   . Automotive engineer (Medical):   Marland Kitchen Lack of Transportation (Non-Medical):   Physical Activity:   . Days of Exercise per Week:   . Minutes of Exercise per Session:   Stress:   . Feeling of Stress :   Social Connections:   . Frequency of Communication with Friends and Family:   . Frequency of Social Gatherings with Friends and Family:   . Attends Religious Services:   . Active Member of Clubs or Organizations:   . Attends Banker Meetings:   Marland Kitchen Marital Status:   Intimate Partner Violence:   . Fear of Current or Ex-Partner:   . Emotionally Abused:   Marland Kitchen Physically Abused:   . Sexually Abused:    Family History  Problem Relation Age of Onset  . Hypertension Mother   . Diabetes Mother   . Hypertension Father   . Diabetes Father   . Colon cancer Neg Hx     OBJECTIVE:  Vitals:   07/01/19 0818  BP: (!) 145/87  Pulse: 77  Resp: 16  Temp: 99 F (37.2 C)  TempSrc: Oral  SpO2: 96%     General appearance: alert; appears fatigued, but nontoxic, speaking in full sentences and managing own secretions HEENT: NCAT; Ears: EACs clear, TMs pearly gray with visible cone of light, without erythema; Eyes: PERRL, EOMI grossly; Nose: no obvious rhinorrhea; Throat: oropharynx clear, tonsils 1+ and mildly erythematous without white tonsillar exudates, uvula midline Neck: supple without LAD Lungs: CTA bilaterally without adventitious breath sounds; cough absent Heart: regular rate and rhythm.  Radial pulses 2+ symmetrical bilaterally Skin: warm and dry Psychological: alert and cooperative; normal mood and affect  LABS: Results for orders placed or performed during the hospital encounter of 07/01/19 (from the past 24 hour(s))  POCT rapid strep A [facility])     Status: None   Collection Time: 07/01/19  8:44 AM  Result Value Ref Range   Streptococcus, Group A Screen (Direct) NEGATIVE NEGATIVE     ASSESSMENT & PLAN:  1. Pharyngitis, unspecified etiology   2. Other fatigue   3.  Nonintractable headache, unspecified chronicity pattern, unspecified headache type   4. Acute otalgia, left   5. Encounter for screening for COVID-19     Meds ordered this encounter  Medications  . cetirizine (ZYRTEC ALLERGY) 10 MG tablet    Sig: Take 1 tablet (10 mg total) by mouth daily.    Dispense:  30 tablet    Refill:  0    Order Specific Question:   Supervising Provider    Answer:   Merrilee Jansky [id number]  Strep test negative, will send out for culture and we will call you with results Get plenty of rest and push fluids Prescribed Zyrtec and use chloraseptic spray as needed for throat pain. Drink warm or cool liquids, use throat lozenges, or popsicles to help alleviate symptoms Take OTC ibuprofen or tylenol as needed for pain Follow up with PCP if symptoms persists Return or go to ER if patient has any new or worsening symptoms such as fever, chills, nausea, vomiting, worsening sore throat, cough, abdominal pain, chest pain, changes in bowel or bladder habits  Covid swab obtained in office today.  Patient instructed to quarantine until results are back and negative.  If results are negative, patient may resume daily schedule as tolerated once they are fever free for 24 hours without the use of antipyretic medications.  If results are positive, patient instructed to quarantine 10 days from today.  Patient instructed to follow-up with primary care with this office as needed.  Patient instructed to follow-up in the ER for trouble swallowing, trouble breathing, other concerning symptoms.  Reviewed expectations re: course of current medical issues. Questions answered. Outlined signs and symptoms indicating need for more acute intervention. Patient verbalized understanding. After Visit Summary given.          Faustino Congress, NP 07/01/19 [phone]

## 2019-07-01 NOTE — Discharge Instructions (Addendum)
Your rapid strep test is negative.  A throat culture is pending; we will call you if it is positive requiring treatment.    You may use OTC chloraseptic spray to help alleviate throat pain as well as tylenol or ibuprofen  Your COVID test is pending.  You should self quarantine until the test result is back.    Take Tylenol as needed for fever or discomfort.  Rest and keep yourself hydrated.    Go to the emergency department if you develop shortness of breath, severe diarrhea, high fever not relieved by Tylenol or ibuprofen, or other concerning symptoms.

## 2019-07-01 NOTE — ED Triage Notes (Signed)
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 She c/o neck swelling, sore thoat, left ear pain, coughing up mucous, all symptoms started 2 days ago. Has had covid vaccines x 2.

## 2019-07-03 LAB — CULTURE, GROUP A STREP (THRC)

## 2019-08-28 ENCOUNTER — Ambulatory Visit (HOSPITAL_COMMUNITY)
Admission: EM | Admit: 2019-08-28 | Discharge: 2019-08-28 | Disposition: A | Discharge status: Home / Self Care | Payer: Medicaid Other | Attending: Family Medicine | Admitting: Family Medicine

## 2019-08-28 ENCOUNTER — Encounter (HOSPITAL_COMMUNITY): Payer: Self-pay | Admitting: Emergency Medicine

## 2019-08-28 ENCOUNTER — Other Ambulatory Visit: Payer: Self-pay

## 2019-08-28 ENCOUNTER — Ambulatory Visit (INDEPENDENT_AMBULATORY_CARE_PROVIDER_SITE_OTHER): Payer: Medicaid Other

## 2019-08-28 DIAGNOSIS — M542 Cervicalgia: Secondary | ICD-10-CM

## 2019-08-28 DIAGNOSIS — W109XXA Fall (on) (from) unspecified stairs and steps, initial encounter: Secondary | ICD-10-CM | POA: Diagnosis not present

## 2019-08-28 DIAGNOSIS — S161XXA Strain of muscle, fascia and tendon at neck level, initial encounter: Secondary | ICD-10-CM

## 2019-08-28 DIAGNOSIS — W19XXXA Unspecified fall, initial encounter: Secondary | ICD-10-CM

## 2019-08-28 MED ORDER — IBUPROFEN 800 MG PO TABS: 800.0000 mg | ORAL_TABLET | Freq: Three times a day (TID) | ORAL | 0 refills | Status: DC

## 2019-08-28 MED ORDER — CYCLOBENZAPRINE HCL 10 MG PO TABS: ORAL_TABLET | ORAL | 0 refills | Status: DC

## 2019-08-28 NOTE — ED Triage Notes (Signed)
Pt sts right sided neck and upper back pain after fall 1 week ago down stairs
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
## 2020-04-20 ENCOUNTER — Encounter (HOSPITAL_COMMUNITY): Payer: Self-pay | Admitting: Emergency Medicine

## 2020-04-20 ENCOUNTER — Emergency Department (HOSPITAL_COMMUNITY): Payer: Medicaid Other

## 2020-04-20 ENCOUNTER — Other Ambulatory Visit: Payer: Self-pay

## 2020-04-20 ENCOUNTER — Emergency Department (HOSPITAL_COMMUNITY)
Admission: EM | Admit: 2020-04-20 | Discharge: 2020-04-20 | Disposition: A | Discharge status: Home / Self Care | Payer: Medicaid Other | Attending: Emergency Medicine | Admitting: Emergency Medicine

## 2020-04-20 DIAGNOSIS — R4182 Altered mental status, unspecified: Secondary | ICD-10-CM | POA: Diagnosis not present

## 2020-04-20 DIAGNOSIS — I10 Essential (primary) hypertension: Secondary | ICD-10-CM | POA: Insufficient documentation

## 2020-04-20 DIAGNOSIS — R42 Dizziness and giddiness: Secondary | ICD-10-CM | POA: Diagnosis not present

## 2020-04-20 DIAGNOSIS — J45909 Unspecified asthma, uncomplicated: Secondary | ICD-10-CM | POA: Insufficient documentation

## 2020-04-20 DIAGNOSIS — Z7984 Long term (current) use of oral hypoglycemic drugs: Secondary | ICD-10-CM | POA: Diagnosis not present

## 2020-04-20 DIAGNOSIS — R296 Repeated falls: Secondary | ICD-10-CM | POA: Diagnosis not present

## 2020-04-20 DIAGNOSIS — E119 Type 2 diabetes mellitus without complications: Secondary | ICD-10-CM | POA: Insufficient documentation

## 2020-04-20 DIAGNOSIS — J069 Acute upper respiratory infection, unspecified: Secondary | ICD-10-CM | POA: Diagnosis not present

## 2020-04-20 DIAGNOSIS — Z794 Long term (current) use of insulin: Secondary | ICD-10-CM | POA: Diagnosis not present

## 2020-04-20 DIAGNOSIS — Z79899 Other long term (current) drug therapy: Secondary | ICD-10-CM | POA: Insufficient documentation

## 2020-04-20 DIAGNOSIS — R531 Weakness: Secondary | ICD-10-CM | POA: Diagnosis not present

## 2020-04-20 LAB — URINALYSIS, ROUTINE W REFLEX MICROSCOPIC
Bilirubin Urine: NEGATIVE
Glucose, UA: NEGATIVE mg/dL
Ketones, ur: NEGATIVE mg/dL
Nitrite: NEGATIVE
Protein, ur: 30 mg/dL — AB
Specific Gravity, Urine: 1.017 (ref 1.005–1.030)
pH: 5 (ref 5.0–8.0)

## 2020-04-20 LAB — COMPREHENSIVE METABOLIC PANEL
ALT: 22 U/L (ref 0–44)
AST: 21 U/L (ref 15–41)
Albumin: 3.3 g/dL — ABNORMAL LOW (ref 3.5–5.0)
Alkaline Phosphatase: 95 U/L (ref 38–126)
Anion gap: 6 (ref 5–15)
BUN: 16 mg/dL (ref 6–20)
CO2: 28 mmol/L (ref 22–32)
Calcium: 9.3 mg/dL (ref 8.9–10.3)
Chloride: 105 mmol/L (ref 98–111)
Creatinine, Ser: 0.98 mg/dL (ref 0.44–1.00)
GFR, Estimated: >60 mL/min (ref >60)
Glucose, Bld: 162 mg/dL — ABNORMAL HIGH (ref 70–99)
Potassium: 3.5 mmol/L (ref 3.5–5.1)
Sodium: 139 mmol/L (ref 135–145)
Total Bilirubin: 1 mg/dL (ref 0.3–1.2)
Total Protein: 7.5 g/dL (ref 6.5–8.1)

## 2020-04-20 LAB — CBC
HCT: 41 % (ref 36.0–46.0)
Hemoglobin: 12.9 g/dL (ref 12.0–15.0)
MCH: 27.9 pg (ref 26.0–34.0)
MCHC: 31.5 g/dL (ref 30.0–36.0)
MCV: 88.6 fL (ref 80.0–100.0)
Platelets: 240 10*3/uL (ref 150–400)
RBC: 4.63 MIL/uL (ref 3.87–5.11)
RDW: 13.5 % (ref 11.5–15.5)
WBC: 6 10*3/uL (ref 4.0–10.5)
nRBC: 0 % (ref 0.0–0.2)

## 2020-04-20 MED ORDER — FLUTICASONE PROPIONATE 50 MCG/ACT NA SUSP: 2.0000 | Freq: Every day | NASAL | 2 refills | Status: DC

## 2020-04-20 NOTE — ED Provider Notes (Signed)
[facility] EMERGENCY DEPARTMENT Provider Note   CSN: [id number] Arrival date & time: 04/20/20  3845     History Chief Complaint  Patient presents with  . Weakness  . Altered Mental Status    Intermittent approx. 1 week    Crystal Boyer is a 60 y.o. female.  She is here with various complaints.  She said for a few months she has had some urinary incontinence.  Per few weeks she has noticed feeling generally weak all over and has been making mistakes with tasks at work.  Since last evening she has had sneezing and runny nose.  She has had a few falls and says her balance is off.  The history is provided by the patient.  Weakness Severity:  Moderate Onset quality:  Gradual Timing:  Constant Progression:  Worsening Chronicity:  New Relieved by:  Nothing Worsened by:  Activity Ineffective treatments:  None tried Associated symptoms: difficulty walking, dizziness, falls and frequency   Associated symptoms: no abdominal pain, no aphasia, no chest pain, no cough, no diarrhea, no dysuria, no fever, no foul-smelling urine, no headaches, no loss of consciousness, no nausea, no shortness of breath and no vomiting   Altered Mental Status Associated symptoms: weakness   Associated symptoms: no abdominal pain, no fever, no headaches, no nausea, no rash and no vomiting        Past Medical History:  Diagnosis Date  . Allergy   . Asthma   . Depression   . Diabetes mellitus   . Gout 12 days ago  . Hypertension     Patient Active Problem List   Diagnosis Date Noted  . Achilles tendon contracture, left 09/25/2017  . Posterior tibial tendon dysfunction (PTTD) of right lower extremity 09/25/2017  . DIABETES MELLITUS, TYPE II 11/27/2006  . HYPERTENSION 11/27/2006  . 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 ABDOMINAL HYSTERECTOMY    . ANKLE FRACTURE SURGERY     right ankle  . CESAREAN SECTION       OB History    No obstetric history on file.     Family History  Problem Relation Age of Onset  . Hypertension Mother   . Diabetes Mother   . Hypertension Father   . Diabetes Father   . Colon cancer Neg Hx     Social History   Tobacco Use  . Smoking status: Never Smoker  . Smokeless tobacco: Never Used  Vaping Use  . Vaping Use: Never used  Substance Use Topics  . Alcohol use: No    Alcohol/week: 0.0 standard drinks    Comment: social  . Drug use: Yes    Types: Marijuana    Comment: 1x monthly    Home Medications Prior to Admission medications   Medication Sig Start Date End Date Taking? Authorizing Provider  acetaminophen (TYLENOL) 500 MG tablet Take 500 mg by mouth every 6 (six) hours as needed for mild pain.     [provider]  amLODipine (NORVASC) 5 MG tablet Take 1 tablet (5 mg total) by mouth daily. 10/17/18 11/16/18  Wieters, Hallie C, PA-C  cetirizine (ZYRTEC ALLERGY) 10 MG tablet Take 1 tablet (10 mg total) by mouth daily. 07/01/19   Moshe Cipro, NP  cyclobenzaprine (FLEXERIL) 10 MG tablet Take 1 tablet by mouth 3 times daily as needed for muscle spasm. Warning: May cause drowsiness. 08/28/19   Mardella Layman, MD  diazepam (VALIUM) 5 MG tablet Take 0.5 tablets (  2.5 mg total) by mouth every 6 (six) hours as needed for muscle spasms (spasms). May increase to one tablet every 6 hours if 2.5 mg is not effective. 07/03/18   Felicie MornSmith, David, NP  diclofenac sodium (VOLTAREN) 1 % GEL Apply 2 g topically 4 (four) times daily. Patient taking differently: Apply 2 g topically 4 (four) times daily as needed (for pain).  02/04/18   Khatri, Hina, PA-C  glyBURIDE (DIABETA) 5 MG tablet Take 5 mg by mouth daily with breakfast.    [provider]  HYDROcodone-acetaminophen (NORCO/VICODIN) 5-325 MG tablet Take 1 tablet by mouth every 6 (six) hours as needed for severe pain. 07/02/18   Fawze, Mina A, PA-C  ibuprofen (ADVIL) 800 MG tablet Take 1 tablet (800 mg total) by mouth 3 (three)  times daily with meals. 08/28/19   Mardella LaymanHagler, Brian, MD  LANTUS SOLOSTAR 100 UNIT/ML Solostar Pen Inject 60 Units into the skin at bedtime.  10/25/16   [provider]  liraglutide (VICTOZA) 18 MG/3ML SOPN Inject 18 Units into the skin every morning.     [provider]  losartan (COZAAR) 50 MG tablet Take 1 tablet (50 mg total) by mouth daily. 10/17/18 11/16/18  Wieters, Hallie C, PA-C  albuterol (PROVENTIL HFA;VENTOLIN HFA) 108 (90 Base) MCG/ACT inhaler Inhale 2 puffs into the lungs every 6 (six) hours as needed for wheezing or shortness of breath. Patient not taking: Reported on 07/03/2018 04/24/18 01/13/19  Arnaldo Nataliamond, Trueman Worlds S, MD    Allergies    Eggs or egg-derived products, Actos [pioglitazone], Glipizide, and Tramadol  Review of Systems   Review of Systems  Constitutional: Positive for fatigue. Negative for fever.  HENT: Positive for rhinorrhea. Negative for sore throat.   Eyes: Negative for visual disturbance.  Respiratory: Negative for cough and shortness of breath.   Cardiovascular: Negative for chest pain.  Gastrointestinal: Negative for abdominal pain, diarrhea, nausea and vomiting.  Genitourinary: Positive for frequency. Negative for dysuria.  Musculoskeletal: Positive for falls. Negative for neck pain.  Skin: Negative for rash.  Neurological: Positive for dizziness and weakness. Negative for loss of consciousness and headaches.    Physical Exam Updated Vital Signs BP (!) 187/100 (BP Location: Left Arm)   Pulse 86   Temp 98.4 F (36.9 C) (Oral)   Resp 15   SpO2 100%   Physical Exam Vitals and nursing note reviewed.  Constitutional:      General: She is not in acute distress.    Appearance: Normal appearance. She is well-developed.  HENT:     Head: Normocephalic and atraumatic.  Eyes:     Conjunctiva/sclera: Conjunctivae normal.  Cardiovascular:     Rate and Rhythm: Normal rate and regular rhythm.     Heart sounds: No murmur heard.   Pulmonary:      Effort: Pulmonary effort is normal. No respiratory distress.     Breath sounds: Normal breath sounds.  Abdominal:     Palpations: Abdomen is soft.     Tenderness: There is no abdominal tenderness.  Musculoskeletal:     Cervical back: Neck supple.  Skin:    General: Skin is warm and dry.  Neurological:     General: No focal deficit present.     Mental Status: She is alert and oriented to person, place, and time.     Cranial Nerves: No cranial nerve deficit.     Sensory: No sensory deficit.     Motor: No weakness.     Coordination: Coordination normal.  Gait: Gait normal.     ED Results / Procedures / Treatments   Labs (all labs ordered are listed, but only abnormal results are displayed) Labs Reviewed  COMPREHENSIVE METABOLIC PANEL - Abnormal; Notable for the following components:      Result Value   Glucose, Bld 162 (*)    Albumin 3.3 (*)    All other components within normal limits  URINALYSIS, ROUTINE W REFLEX MICROSCOPIC - Abnormal; Notable for the following components:   APPearance HAZY (*)    Hgb urine dipstick SMALL (*)    Protein, ur 30 (*)    Leukocytes,Ua TRACE (*)    Bacteria, UA MANY (*)    All other components within normal limits  CBC  CBG MONITORING, ED    EKG EKG Interpretation  Date/Time:  Monday April 20 2020 09:46:36 EDT Ventricular Rate:  76 PR Interval:  150 QRS Duration: 80 QT Interval:  378 QTC Calculation: 425 R Axis:   37 Text Interpretation: Normal sinus rhythm Normal ECG nonspecific t wave flattening compared with prior 11/09 Confirmed by Meridee Score [phone]) on 04/20/2020 10:17:40 AM   Radiology CT Head Wo Contrast  Result Date: 04/20/2020 CLINICAL DATA:  60 year old female with a history mental status changes EXAM: CT HEAD WITHOUT CONTRAST TECHNIQUE: Contiguous axial images were obtained from the base of the skull through the vertex without intravenous contrast. COMPARISON:  None. FINDINGS: Brain: No acute intracranial  hemorrhage. No midline shift. Unremarkable appearance of the ventricles. Gray-white differentiation maintained. Focal hypodensities in the bilateral hemisphere, subcortical white matter. More confluent region hypodensity in the right periventricular white matter, adjacent to the frontal horn. Vascular: Intracranial atherosclerosis. Skull: No acute fracture.  No aggressive bone lesion identified. Sinuses/Orbits: Unremarkable appearance of the orbits. Mastoid air cells clear. No middle ear effusion. No significant sinus disease. Other: None IMPRESSION: No acute intracranial abnormality. 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 Electronically Signed   By: Gilmer Mor D.O.   On: 04/20/2020 12:44    Procedures Procedures   Medications Ordered in ED Medications - No data to display  ED Course  I have reviewed the triage vital signs and the nursing notes.  Pertinent labs & imaging results that were available during my care of the patient were reviewed by me and considered in my medical decision making (see chart for details).  Clinical Course as of 04/20/20 1741  Mon Apr 20, 2020  1351 Patient asking to eat lunch here.  I reviewed her work-up with her.  She still needs to give urine sample but the rest of her work-up has been fairly unremarkable. [MB]    Clinical Course User Index [MB] Terrilee Files, MD   MDM Rules/Calculators/A&P                         This patient complains of general weakness, unsteadiness, possible confusion, urinary incontinence and frequency; this involves an extensive number of treatment Options and is a complaint that carries with it a high risk of complications and Morbidity. The differential includes hyperglycemia, UTI, stroke, mass, seizure, metabolic derangement, infection  I ordered, reviewed and interpreted labs, which included CBC with normal white count, chemistries fairly normal glucose, urinalysis without  obvious signs of infection probably contaminated sample  I ordered imaging studies which included CT head and I independently    visualized and interpreted imaging which showed no acute findings  Previous records obtained and reviewed in epic, no recent admissions  After the interventions stated above, I reevaluated the patient and found patient currently asymptomatic.  She is ambulating in the department without any difficulties.  Eaten lunch.  And she is comfortable plan for following up with her doctor.  Return instructions discussed.   Final Clinical Impression(s) / ED Diagnoses Final diagnoses:  Generalized weakness  Viral upper respiratory tract infection    Rx / DC Orders ED Discharge Orders         Ordered    fluticasone (FLONASE) 50 MCG/ACT nasal spray  Daily        04/20/20 1454           Terrilee Files, MD 04/20/20 1745

## 2020-04-20 NOTE — Discharge Instructions (Addendum)
You were seen in the emergency department for weakness unsteadiness runny nose.  You had lab work CAT scan and urinalysis that did not show any obvious explanation for your symptoms.  We are prescribing you some steroid nasal spray to help with your nasal congestion runny nose.  Please contact your primary care doctor for close follow-up.  Return to the emergency department if any worsening or concerning symptoms

## 2020-04-20 NOTE — ED Triage Notes (Signed)
Patient coming from home. Complaint of feeling generally weak and periods of confusion for 1 week. Patient states the confusion has been off and on while at work. Hx of HTN and diabetes.

## 2020-04-30 ENCOUNTER — Emergency Department (HOSPITAL_COMMUNITY): Payer: Medicaid Other

## 2020-04-30 ENCOUNTER — Encounter (HOSPITAL_COMMUNITY): Payer: Self-pay

## 2020-04-30 ENCOUNTER — Observation Stay (HOSPITAL_COMMUNITY)
Admission: EM | Admit: 2020-04-30 | Discharge: 2020-05-01 | Disposition: A | Discharge status: Home / Self Care | Payer: Medicaid Other | Attending: Internal Medicine | Admitting: Internal Medicine

## 2020-04-30 ENCOUNTER — Observation Stay (HOSPITAL_COMMUNITY): Payer: Medicaid Other

## 2020-04-30 ENCOUNTER — Other Ambulatory Visit: Payer: Self-pay

## 2020-04-30 DIAGNOSIS — Z8673 Personal history of transient ischemic attack (TIA), and cerebral infarction without residual deficits: Secondary | ICD-10-CM | POA: Diagnosis present

## 2020-04-30 DIAGNOSIS — Z20822 Contact with and (suspected) exposure to covid-19: Secondary | ICD-10-CM | POA: Diagnosis not present

## 2020-04-30 DIAGNOSIS — E119 Type 2 diabetes mellitus without complications: Secondary | ICD-10-CM | POA: Diagnosis not present

## 2020-04-30 DIAGNOSIS — I639 Cerebral infarction, unspecified: Principal | ICD-10-CM | POA: Insufficient documentation

## 2020-04-30 DIAGNOSIS — Z79899 Other long term (current) drug therapy: Secondary | ICD-10-CM | POA: Diagnosis not present

## 2020-04-30 DIAGNOSIS — I10 Essential (primary) hypertension: Secondary | ICD-10-CM | POA: Insufficient documentation

## 2020-04-30 DIAGNOSIS — I63511 Cerebral infarction due to unspecified occlusion or stenosis of right middle cerebral artery: Secondary | ICD-10-CM

## 2020-04-30 DIAGNOSIS — R29810 Facial weakness: Secondary | ICD-10-CM | POA: Diagnosis present

## 2020-04-30 DIAGNOSIS — J45909 Unspecified asthma, uncomplicated: Secondary | ICD-10-CM | POA: Insufficient documentation

## 2020-04-30 LAB — COMPREHENSIVE METABOLIC PANEL
ALT: 21 U/L (ref 0–44)
AST: 23 U/L (ref 15–41)
Albumin: 3.3 g/dL — ABNORMAL LOW (ref 3.5–5.0)
Alkaline Phosphatase: 96 U/L (ref 38–126)
Anion gap: 9 (ref 5–15)
BUN: 11 mg/dL (ref 6–20)
CO2: 26 mmol/L (ref 22–32)
Calcium: 9.4 mg/dL (ref 8.9–10.3)
Chloride: 104 mmol/L (ref 98–111)
Creatinine, Ser: 1.03 mg/dL — ABNORMAL HIGH (ref 0.44–1.00)
GFR, Estimated: >60 mL/min (ref >60)
Glucose, Bld: 246 mg/dL — ABNORMAL HIGH (ref 70–99)
Potassium: 4.3 mmol/L (ref 3.5–5.1)
Sodium: 139 mmol/L (ref 135–145)
Total Bilirubin: 0.7 mg/dL (ref 0.3–1.2)
Total Protein: 7.4 g/dL (ref 6.5–8.1)

## 2020-04-30 LAB — URINALYSIS, ROUTINE W REFLEX MICROSCOPIC
Bacteria, UA: NONE SEEN
Bilirubin Urine: NEGATIVE
Glucose, UA: NEGATIVE mg/dL
Hgb urine dipstick: NEGATIVE
Ketones, ur: NEGATIVE mg/dL
Leukocytes,Ua: NEGATIVE
Nitrite: NEGATIVE
Protein, ur: 30 mg/dL — AB
Specific Gravity, Urine: 1.015 (ref 1.005–1.030)
pH: 5 (ref 5.0–8.0)

## 2020-04-30 LAB — CBG MONITORING, ED
Glucose-Capillary: 160 mg/dL — ABNORMAL HIGH (ref 70–99)
Glucose-Capillary: 126 mg/dL — ABNORMAL HIGH (ref 70–99)

## 2020-04-30 LAB — CBC WITH DIFFERENTIAL/PLATELET
Abs Immature Granulocytes: 0.02 10*3/uL (ref 0.00–0.07)
Basophils Absolute: 0 10*3/uL (ref 0.0–0.1)
Basophils Relative: 0 %
Eosinophils Absolute: 0.2 10*3/uL (ref 0.0–0.5)
Eosinophils Relative: 4 %
HCT: 41 % (ref 36.0–46.0)
Hemoglobin: 13.1 g/dL (ref 12.0–15.0)
Immature Granulocytes: 0 %
Lymphocytes Relative: 28 %
Lymphs Abs: 1.6 10*3/uL (ref 0.7–4.0)
MCH: 27.9 pg (ref 26.0–34.0)
MCHC: 32 g/dL (ref 30.0–36.0)
MCV: 87.4 fL (ref 80.0–100.0)
Monocytes Absolute: 0.4 10*3/uL (ref 0.1–1.0)
Monocytes Relative: 7 %
Neutro Abs: 3.4 10*3/uL (ref 1.7–7.7)
Neutrophils Relative %: 61 %
Platelets: 239 10*3/uL (ref 150–400)
RBC: 4.69 MIL/uL (ref 3.87–5.11)
RDW: 13.1 % (ref 11.5–15.5)
WBC: 5.6 10*3/uL (ref 4.0–10.5)
nRBC: 0 % (ref 0.0–0.2)

## 2020-04-30 LAB — TROPONIN I (HIGH SENSITIVITY)
Troponin I (High Sensitivity): 4 ng/L (ref <18)
Troponin I (High Sensitivity): 4 ng/L (ref <18)

## 2020-04-30 LAB — HIV ANTIBODY (ROUTINE TESTING W REFLEX): HIV Screen 4th Generation wRfx: NONREACTIVE

## 2020-04-30 LAB — PROTIME-INR
INR: 1 (ref 0.8–1.2)
Prothrombin Time: 12.8 seconds (ref 11.4–15.2)

## 2020-04-30 LAB — MAGNESIUM: Magnesium: 1.6 mg/dL — ABNORMAL LOW (ref 1.7–2.4)

## 2020-04-30 LAB — PHOSPHORUS: Phosphorus: 3.3 mg/dL (ref 2.5–4.6)

## 2020-04-30 LAB — APTT: aPTT: 29 seconds (ref 24–36)

## 2020-04-30 LAB — SARS CORONAVIRUS 2 (TAT 6-24 HRS): SARS Coronavirus 2: NEGATIVE

## 2020-04-30 LAB — TSH: TSH: 1.872 u[IU]/mL (ref 0.350–4.500)

## 2020-04-30 LAB — GLUCOSE, CAPILLARY: Glucose-Capillary: 178 mg/dL — ABNORMAL HIGH (ref 70–99)

## 2020-04-30 MED ORDER — INSULIN GLARGINE 100 UNIT/ML ~~LOC~~ SOLN
50.0000 [IU] | Freq: Every day | SUBCUTANEOUS | Status: DC
  Filled 2020-04-30: qty 0.5

## 2020-04-30 MED ORDER — ASPIRIN 300 MG RE SUPP
150.0000 mg | Freq: Once | RECTAL | Status: AC
  Administered 2020-04-30: 150 mg via RECTAL
  Filled 2020-04-30: qty 1

## 2020-04-30 MED ORDER — INSULIN GLARGINE 100 UNIT/ML ~~LOC~~ SOLN
25.0000 [IU] | Freq: Every day | SUBCUTANEOUS | Status: DC
  Administered 2020-05-01: 25 [IU] via SUBCUTANEOUS
  Filled 2020-04-30 (×3): qty 0.25

## 2020-04-30 MED ORDER — ACETAMINOPHEN 325 MG PO TABS: 650.0000 mg | ORAL_TABLET | Freq: Four times a day (QID) | ORAL | Status: DC | PRN

## 2020-04-30 MED ORDER — ASPIRIN EC 81 MG PO TBEC: 81.0000 mg | DELAYED_RELEASE_TABLET | Freq: Every day | ORAL | Status: DC

## 2020-04-30 MED ORDER — ACETAMINOPHEN 650 MG RE SUPP: 650.0000 mg | Freq: Four times a day (QID) | RECTAL | Status: DC | PRN

## 2020-04-30 MED ORDER — ENOXAPARIN SODIUM 40 MG/0.4ML ~~LOC~~ SOLN
40.0000 mg | SUBCUTANEOUS | Status: DC
  Administered 2020-04-30: 40 mg via SUBCUTANEOUS
  Filled 2020-04-30: qty 0.4

## 2020-04-30 MED ORDER — LORAZEPAM 2 MG/ML IJ SOLN
0.5000 mg | Freq: Once | INTRAMUSCULAR | Status: AC | PRN
  Administered 2020-05-01: 0.5 mg via INTRAVENOUS
  Filled 2020-04-30: qty 1

## 2020-04-30 MED ORDER — INSULIN ASPART 100 UNIT/ML ~~LOC~~ SOLN: 0.0000 [IU] | SUBCUTANEOUS | Status: DC

## 2020-04-30 MED ORDER — INSULIN ASPART 100 UNIT/ML ~~LOC~~ SOLN
0.0000 [IU] | SUBCUTANEOUS | Status: DC
  Administered 2020-04-30: 2 [IU] via SUBCUTANEOUS
  Administered 2020-05-01 (×2): 3 [IU] via SUBCUTANEOUS

## 2020-04-30 MED ORDER — ASPIRIN 325 MG PO TABS: 325.0000 mg | ORAL_TABLET | Freq: Once | ORAL | Status: DC

## 2020-04-30 NOTE — ED Notes (Signed)
Neurology NP at bedside.

## 2020-04-30 NOTE — ED Provider Notes (Signed)
[facility] EMERGENCY DEPARTMENT Provider Note   CSN: [id number] Arrival date & time: 04/30/20  7673     History Chief Complaint  Patient presents with  . stroke like s/s    Crystal Boyer is a 60 y.o. female.  HPI      Crystal Boyer is a 60 y.o. female, with a history of asthma, DM, HTN, presenting to the ED with left-sided facial droop first noted Wednesday, March 16.  She states this was first noted by her son while they were eating. She was seen by PCP today who sent her here due to concern for CVA.  She also notes a couple weeks of generalized weakness, intermittent bilateral blurry vision, intermittent lightheadedness, memory dysfunction, and intermittently "feeling out of it" and disconnected from her current situation or task. She endorses occasional, momentary chest discomfort, usually in the mornings. She does state she was seen in the emergency department last week on March 14 due to urinary frequency, among other complaints.  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 Covid vaccination x3. Denies fever/chills, cough, shortness of breath, N/V/C/D, acute neck/back pain, recent trauma, syncope, vision loss, focal extremity weakness/numbness, abdominal pain, or any other complaints.   Past Medical History:  Diagnosis Date  . Allergy   . Asthma   . Depression   . Diabetes mellitus   . Gout 12 days ago  . Hypertension     Patient Active Problem List   Diagnosis Date Noted  . CVA (cerebral vascular accident) (HCC) 04/30/2020  . Achilles tendon contracture, left 09/25/2017  . Posterior tibial tendon dysfunction (PTTD) of right lower extremity 09/25/2017  . DIABETES MELLITUS, TYPE II 11/27/2006  . HYPERTENSION 11/27/2006  . 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 ABDOMINAL HYSTERECTOMY    . ANKLE FRACTURE SURGERY     right ankle  . CESAREAN SECTION       OB History    No obstetric history on file.     Family History  Problem Relation Age of Onset  . Hypertension Mother   . Diabetes Mother   . Hypertension Father   . Diabetes Father   . Colon cancer Neg Hx     Social History   Tobacco Use  . Smoking status: Never Smoker  . Smokeless tobacco: Never Used  Vaping Use  . Vaping Use: Never used  Substance Use Topics  . Alcohol use: No    Alcohol/week: 0.0 standard drinks    Comment: social  . Drug use: Yes    Types: Marijuana    Comment: 1x monthly    Home Medications Prior to Admission medications   Medication Sig Start Date End Date Taking? Authorizing Provider  acetaminophen (TYLENOL) 500 MG tablet Take 500 mg by mouth every 6 (six) hours as needed for mild pain.    Yes [provider]  cetirizine (ZYRTEC ALLERGY) 10 MG tablet Take 1 tablet (10 mg total) by mouth daily. 07/01/19  Yes Moshe Cipro, NP  ibuprofen (ADVIL) 200 MG tablet Take 400 mg by mouth every 6 (six) hours as needed for mild pain.   Yes [provider]  LANTUS SOLOSTAR 100 UNIT/ML Solostar Pen Inject 50 Units into the skin at bedtime. 10/25/16  Yes [provider]  losartan (COZAAR) 50 MG tablet Take 1 tablet (50 mg total) by mouth daily. 10/17/18 11/16/18 Yes Wieters, Hallie C, PA-C  amLODipine (NORVASC) 5 MG tablet  Take 1 tablet (5 mg total) by mouth daily. 10/17/18 11/16/18  Wieters, Hallie C, PA-C  cyclobenzaprine (FLEXERIL) 10 MG tablet Take 1 tablet by mouth 3 times daily as needed for muscle spasm. Warning: May cause drowsiness. Patient not taking: No sig reported 08/28/19   Mardella Layman, MD  diazepam (VALIUM) 5 MG tablet Take 0.5 tablets (2.5 mg total) by mouth every 6 (six) hours as needed for muscle spasms (spasms). May increase to one tablet every 6 hours if 2.5 mg is not effective. Patient not taking: No sig reported 07/03/18   Felicie Morn, NP  diclofenac sodium (VOLTAREN) 1 % GEL Apply 2 g topically 4 (four) times daily. Patient not  taking: Reported on 04/30/2020 02/04/18   Dietrich Pates, PA-C  fluticasone (FLONASE) 50 MCG/ACT nasal spray Place 2 sprays into both nostrils daily. 04/20/20   Terrilee Files, MD  HYDROcodone-acetaminophen (NORCO/VICODIN) 5-325 MG tablet Take 1 tablet by mouth every 6 (six) hours as needed for severe pain. Patient not taking: Reported on 04/30/2020 07/02/18   Michela Pitcher A, PA-C  ibuprofen (ADVIL) 800 MG tablet Take 1 tablet (800 mg total) by mouth 3 (three) times daily with meals. Patient not taking: Reported on 04/30/2020 08/28/19   Mardella Layman, MD  albuterol (PROVENTIL HFA;VENTOLIN HFA) 108 (90 Base) MCG/ACT inhaler Inhale 2 puffs into the lungs every 6 (six) hours as needed for wheezing or shortness of breath. Patient not taking: Reported on 07/03/2018 04/24/18 01/13/19  Arnaldo Natal, MD    Allergies    Eggs or egg-derived products, Actos [pioglitazone], Glipizide, and Tramadol  Review of Systems   Review of Systems  Constitutional: Negative for chills, diaphoresis and fever.  Respiratory: Negative for cough and shortness of breath.   Cardiovascular: Positive for chest pain. Negative for leg swelling.  Gastrointestinal: Negative for abdominal pain, diarrhea, nausea and vomiting.  Genitourinary: Negative for difficulty urinating.  Musculoskeletal: Negative for back pain and neck pain.  Neurological: Positive for facial asymmetry, weakness and light-headedness. Negative for seizures and syncope.  All other systems reviewed and are negative.   Physical Exam Updated Vital Signs BP (!) 202/95 (BP Location: Right Arm)   Pulse 73   Temp 98.1 F (36.7 C) (Oral)   Resp 18   SpO2 99%   Physical Exam Vitals and nursing note reviewed.  Constitutional:      General: She is not in acute distress.    Appearance: She is well-developed. She is not diaphoretic.  HENT:     Head: Normocephalic and atraumatic.     Mouth/Throat:     Mouth: Mucous membranes are moist.     Pharynx: Oropharynx  is clear.  Eyes:     Conjunctiva/sclera: Conjunctivae normal.  Cardiovascular:     Rate and Rhythm: Normal rate and regular rhythm.     Pulses: Normal pulses.          Radial pulses are 2+ on the right side and 2+ on the left side.       Posterior tibial pulses are 2+ on the right side and 2+ on the left side.     Heart sounds: Normal heart sounds.     Comments: Tactile temperature in the extremities appropriate and equal bilaterally. Pulmonary:     Effort: Pulmonary effort is normal. No respiratory distress.     Breath sounds: Normal breath sounds.  Abdominal:     Palpations: Abdomen is soft.     Tenderness: There is no abdominal tenderness. There is no guarding.  Musculoskeletal:     Cervical back: Normal range of motion and neck supple.     Right lower leg: No edema.     Left lower leg: No edema.  Skin:    General: Skin is warm and dry.  Neurological:     Mental Status: She is alert.     Comments: There is facial droop noted on the left when the patient attempts to smile.  This deficit does not involve the forehead or eyebrows, she has motor function of both bilaterally. No noted acute cognitive deficit. Sensation grossly intact to light touch in the extremities.   Grip strengths equal bilaterally.   Strength 5/5 in all extremities.  Coordination intact.  Cranial nerves III-XII grossly intact.  Handles oral secretions without noted difficulty.  No noted phonation or speech deficit.  Psychiatric:        Mood and Affect: Mood and affect normal.        Speech: Speech normal.        Behavior: Behavior normal.     ED Results / Procedures / Treatments   Labs (all labs ordered are listed, but only abnormal results are displayed) Labs Reviewed  COMPREHENSIVE METABOLIC PANEL - Abnormal; Notable for the following components:      Result Value   Glucose, Bld 246 (*)    Creatinine, Ser 1.03 (*)    Albumin 3.3 (*)    All other components within normal limits  URINALYSIS,  ROUTINE W REFLEX MICROSCOPIC - Abnormal; Notable for the following components:   Protein, ur 30 (*)    All other components within normal limits  CBG MONITORING, ED - Abnormal; Notable for the following components:   Glucose-Capillary 160 (*)    All other components within normal limits  CBC WITH DIFFERENTIAL/PLATELET  PROTIME-INR  APTT  TSH  HIV ANTIBODY (ROUTINE TESTING W REFLEX)  CBC  CREATININE, SERUM  PHOSPHORUS  MAGNESIUM  TROPONIN I (HIGH SENSITIVITY)  TROPONIN I (HIGH SENSITIVITY)    EKG EKG Interpretation  Date/Time:  Thursday April 30 2020 09:40:11 EDT Ventricular Rate:  71 PR Interval:    QRS Duration: 88 QT Interval:  400 QTC Calculation: 435 R Axis:   30 Text Interpretation: Sinus rhythm normal, no sig change from previous Confirmed by Arby Barrette [phone]) on 04/30/2020 2:31:17 PM   Radiology CT Head Wo Contrast  Result Date: 04/30/2020 CLINICAL DATA:  Neuro deficit, acute, stroke suspected. Altered mental status. EXAM: CT HEAD WITHOUT CONTRAST TECHNIQUE: Contiguous axial images were obtained from the base of the skull through the vertex without intravenous contrast. COMPARISON:  Head CT 04/20/2020. FINDINGS: Brain: Cerebral volume is normal. Patchy and ill-defined hypoattenuation within the bilateral cerebral white matter is overall moderate, but advanced for age. Findings are nonspecific but most often secondary to chronic small vessel ischemia. There is no acute intracranial hemorrhage. No demarcated cortical infarct. No extra-axial fluid collection. No evidence of intracranial mass. No midline shift. Vascular: No hyperdense vessel.  Atherosclerotic calcifications Skull: No calvarial fracture or aggressive appearing osseous lesion Sinuses/Orbits: Visualized orbits show no acute finding. Trace bilateral maxillary sinus mucosal thickening. IMPRESSION: No CT evidence of acute intracranial abnormality. Redemonstrated patchy and ill-defined hypoattenuation within the  bilateral cerebral white matter, which is overall moderate in severity but advanced for age. Findings are nonspecific, but most often secondary to chronic small vessel ischemia. Mild bilateral maxillary sinus mucosal thickening. Electronically Signed   By: Jackey Loge DO   On: 04/30/2020 10:59    Procedures Procedures  Medications Ordered in ED Medications  aspirin suppository 150 mg (has no administration in time range)  LORazepam (ATIVAN) injection 0.5 mg (has no administration in time range)  insulin glargine (LANTUS) Solostar Pen 50 Units (has no administration in time range)  enoxaparin (LOVENOX) injection 40 mg (has no administration in time range)  acetaminophen (TYLENOL) tablet 650 mg (has no administration in time range)    Or  acetaminophen (TYLENOL) suppository 650 mg (has no administration in time range)    ED Course  I have reviewed the triage vital signs and the nursing notes.  Pertinent labs & imaging results that were available during my care of the patient were reviewed by me and considered in my medical decision making (see chart for details).  Clinical Course as of 04/30/20 1546  Thu Apr 30, 2020  1004 BP(!): 202/95 Patient voices compliance with her oral hypertension medications. [SJ]  1253 Spoke with Dr. Otelia LimesLindzen, neurologist.  States they will come see the patient for evaluation. [SJ]  1423 Dr. Otelia LimesLindzen states he suspects a stroke for this patient.  Recommends admission via medicine as well as MRI brain.  He has ordered aspirin for the patient. [SJ]  1451 Spoke with Dr. Marchia Bondoe, IM resident.  [SJ]    Clinical Course User Index [SJ] Joy, Hillard DankerShawn C, PA-C   MDM Rules/Calculators/A&P                          Patient presents with left-sided facial droop without other noted focal deficits. Patient is nontoxic appearing, afebrile, not tachycardic, not tachypneic, not hypotensive, maintains excellent SPO2 on room air, and is in no apparent distress.   I have reviewed  the patient's chart to obtain more information.   I reviewed and interpreted the patient's labs and radiological studies. Presentation concerning for acute stroke, though out of the code stroke window. Delay in MRI due to having an MRI machine inoperable.  She did not do well on her swallow screen, therefore she was kept n.p.o. until assessment can be made by speech therapy for recommendations.  Findings and plan of care discussed with attending physician, Arby BarretteMarcy Pfeiffer, MD. Dr. Donnald GarrePfeiffer personally evaluated and examined this patient.    Final Clinical Impression(s) / ED Diagnoses Final diagnoses:  Facial droop    Rx / DC Orders ED Discharge Orders    None       Concepcion LivingJoy, Shawn C, PA-C 04/30/20 1549    Arby BarrettePfeiffer, Marcy, MD 05/01/20 [phone]

## 2020-04-30 NOTE — Plan of Care (Signed)
No charge note.   Patient asked NP to call her son. No answer.   Jimmye Norman, NP Neurology

## 2020-04-30 NOTE — ED Notes (Signed)
Patient transported to MRI 

## 2020-04-30 NOTE — ED Notes (Signed)
CBG 160 

## 2020-04-30 NOTE — ED Notes (Signed)
Neurology at bedside & was informed of pt failing swallow screen.

## 2020-04-30 NOTE — ED Provider Notes (Signed)
I provided a substantive portion of the care of this patient.  I personally performed the entirety of the history for this encounter.  EKG Interpretation  Date/Time:  Thursday April 30 2020 09:40:11 EDT Ventricular Rate:  71 PR Interval:    QRS Duration: 88 QT Interval:  400 QTC Calculation: 435 R Axis:   30 Text Interpretation: Sinus rhythm normal, no sig change from previous Confirmed by Arby Barrette [phone]) on 04/30/2020 2:31:17 PM  Patient reports that her son pointed out that she had a left facial droop.  She had not noticed this herself.  She saw her PCP who felt that she did not have the same recall as previously.  Symptoms may be as long as 2 weeks earlier.  Patient is alert and nontoxic.  Mental status is clear.  No respiratory distress.  Droop of the left mouth with flattening of the nasolabial fold.  Normal movement of the brows.  Normal eye closing.  Extraocular motions normal.  I agree with plan and management.   Arby Barrette, MD 04/30/20 [phone]

## 2020-04-30 NOTE — Consult Note (Signed)
Neurology Consultation  Reason for Consult: left facial droop  Referring Physician: M. Pfeiffer, MD  CC: Left facial droop  History is obtained from: patient  HPI: Crystal Boyer is a 60 y.o. female with a PMHx of IDDM II and HTN. Patient was brought in by EMS from her PCP office for left facial droop. States she did not complete PCP visit because PCP called 911 when she saw the droop.   History given to NP is different from the history she gave ED MD and neurology MD. Patient states that her son noticed a left facial droop during dinner 5 days ago which was accompanied by a short period of dysarthria, but not aphasia. The dysarthria quickly dissipated and she has none today. She had no weakness of an extremity or numbness to face or extremities. She does endorse a fall this week when she was getting up out of the floor. When standing and starting to ambulate, she felt her BLEs were weak and she fell. She states she did not trip over something on the floor. Patient also states that at work lately, she has had trouble performing certain tasks and has made mistakes such as signing wrong papers. She did state she had a HA on the same day as onset of the facial droop/dysarthria. She feels this may have been due to stress as on the same day her neighbor came over and started calling her obscene names and being aggressive to the extent that she had to call police. This headache was not accompanied by vomiting, vision changes, numbness or tingling. She does not have a history of migraines. She also states one time she was staring off in space, but no one witnessed this. Event was not accompanied by generalized shaking, tremors, or jerking. She has no history of seizures.   No viral illness, fever, chills, stomach virus, or flu recently. Has had 2 Covid vaccine shots and the booster.   She has a history of headaches which are "all over" her head like a rubber band around her head, but are relieved with  Advil.   She states her sugars are usually high but her last HbA1c was 7. Her blood pressure is high today, and she does not check it at home. She states that she takes her medications every day.   Valium, Vicodin, and Flexeril are on her PTA medication list. She states she no longer takes those and they were for an orthopaedic issue in the past.   Neurology was asked to consult for left facial droop.   LKW: 5 days ago  ROS: A 14 point ROS was performed and is negative except as noted in the HPI.   Past Medical History:  Diagnosis Date  . Allergy   . Asthma   . Depression   . Diabetes mellitus   . Gout 12 days ago  . Hypertension     Family History  Problem Relation Age of Onset  . Hypertension Mother   . Diabetes Mother   . Hypertension Father   . Diabetes Father   . Colon cancer Neg Hx     Social History:   reports that she has never smoked. She has never used smokeless tobacco. She reports current drug use. Drug: Marijuana. She reports that she does not drink alcohol.  Medications No current facility-administered medications for this encounter.  Current Outpatient Medications:  .  acetaminophen (TYLENOL) 500 MG tablet, Take 500 mg by mouth every 6 (six) hours as needed for  mild pain. , Disp: , Rfl:  .  amLODipine (NORVASC) 5 MG tablet, Take 1 tablet (5 mg total) by mouth daily., Disp: 30 tablet, Rfl: 0 .  cetirizine (ZYRTEC ALLERGY) 10 MG tablet, Take 1 tablet (10 mg total) by mouth daily., Disp: 30 tablet, Rfl: 0 .  cyclobenzaprine (FLEXERIL) 10 MG tablet, Take 1 tablet by mouth 3 times daily as needed for muscle spasm. Warning: May cause drowsiness., Disp: 21 tablet, Rfl: 0 .  diazepam (VALIUM) 5 MG tablet, Take 0.5 tablets (2.5 mg total) by mouth every 6 (six) hours as needed for muscle spasms (spasms). May increase to one tablet every 6 hours if 2.5 mg is not effective., Disp: 10 tablet, Rfl: 0 .  diclofenac sodium (VOLTAREN) 1 % GEL, Apply 2 g topically 4 (four)  times daily. (Patient taking differently: Apply 2 g topically 4 (four) times daily as needed (for pain). ), Disp: 100 g, Rfl: 0 .  fluticasone (FLONASE) 50 MCG/ACT nasal spray, Place 2 sprays into both nostrils daily., Disp: 15.8 mL, Rfl: 2 .  glyBURIDE (DIABETA) 5 MG tablet, Take 5 mg by mouth daily with breakfast., Disp: , Rfl:  .  HYDROcodone-acetaminophen (NORCO/VICODIN) 5-325 MG tablet, Take 1 tablet by mouth every 6 (six) hours as needed for severe pain., Disp: 10 tablet, Rfl: 0 .  ibuprofen (ADVIL) 800 MG tablet, Take 1 tablet (800 mg total) by mouth 3 (three) times daily with meals., Disp: 21 tablet, Rfl: 0 .  LANTUS SOLOSTAR 100 UNIT/ML Solostar Pen, Inject 60 Units into the skin at bedtime. , Disp: , Rfl: 5 .  liraglutide (VICTOZA) 18 MG/3ML SOPN, Inject 18 Units into the skin every morning. , Disp: , Rfl:  .  losartan (COZAAR) 50 MG tablet, Take 1 tablet (50 mg total) by mouth daily., Disp: 30 tablet, Rfl: 0 No longer takes Vicodin, Valium, or Flexeril  Exam: Current vital signs: BP (!) 170/95   Pulse 75   Temp 98.1 F (36.7 C) (Oral)   Resp 15   SpO2 99%  Vital signs in last 24 hours: Temp:  [98.1 F (36.7 C)] 98.1 F (36.7 C) (03/24 0939) Pulse Rate:  [64-85] 75 (03/24 1330) Resp:  [8-19] 15 (03/24 1330) BP: (149-202)/(90-104) 170/95 (03/24 1330) SpO2:  [96 %-100 %] 99 % (03/24 1330)  GENERAL: Awake, alert in NAD HEENT: - Normocephalic and atraumatic. Oral mucous membranes are moist. Forehead wrinkles symmetrically with raising eyebrows.  LUNGS - Normal respiratory effort.  CV - RRR. BP high.  ABDOMEN - Soft, nontender Ext: warm, well perfused Psych: calm, cooperative, affect is euthymic.   NEURO:  Mental Status: AA&Ox3.  Speech/Language: speech is without dysarthria or aphasia. Naming, repetition, fluency, and comprehension intact. Cranial Nerves:  II: PERRL 18mm/brisk. visual fields full.  III, IV, VI: EOMI. Lid elevation symmetric and full.  V: sensation is  intact and symmetrical to face to light touch and temperature. Moves jaw back and forth normally.  VII: Smile reveals left facial droop. Able to puff cheeks and raise eyebrows.  VIII: Hearing intact to voice IX, X: Palate elevation is symmetric. Phonation normal.  XI: normal sternocleidomastoid and trapezius muscle strength XII: Tongue is symmetrical without fasciculations.   Motor: 5/5 strength is all muscle groups except for 4/5 left deltoid and 4+/5 left biceps. Fingers of left hand flex slightly relative to the right when patient holds arms outstretched with palms up and eyes closed, but there is no drift.  Orbiting fingers test is positive on the left (  abnormal finger roll test). Tone is normal. Bulk is normal.  Sensation- Intact to light touch and temperature bilaterally in upper and lower extremities. Extinction present to LUE and LLE to light touch when testing DSS.  Coordination: FTN intact bilaterally. HKS intact bilaterally. Rapid hand movements intact.  DTRs: 2+ throughout but slightly less brisk to LUE relative to RUE.  Gait- Deferred  1a Level of Conscious: 0 1b LOC Questions: 0 1c LOC Commands: 0 2 Best Gaze: 0 3 Visual: 0 4 Facial Palsy: 1 5a Motor Arm - left: 1 5b Motor Arm - Right: 0 6a Motor Leg - Left: 0 6b Motor Leg - Right: 0 7 Limb Ataxia: 0 8 Sensory: 1 9 Best Language: 0 10 Dysarthria: 0 11 Extinct. and Inattention: 1 TOTAL: 3   Labs I have reviewed labs in epic and the results pertinent to this consultation are: Glucose 246    Creatinine 1.03     CBC    Component Value Date/Time   WBC 5.6 04/30/2020 1003   RBC 4.69 04/30/2020 1003   HGB 13.1 04/30/2020 1003   HCT 41.0 04/30/2020 1003   PLT 239 04/30/2020 1003   MCV 87.4 04/30/2020 1003   MCH 27.9 04/30/2020 1003   MCHC 32.0 04/30/2020 1003   RDW 13.1 04/30/2020 1003   LYMPHSABS 1.6 04/30/2020 1003   MONOABS 0.4 04/30/2020 1003   EOSABS 0.2 04/30/2020 1003   BASOSABS 0.0 04/30/2020 1003     CMP     Component Value Date/Time   NA 139 04/30/2020 1003   K 4.3 04/30/2020 1003   CL 104 04/30/2020 1003   CO2 26 04/30/2020 1003   GLUCOSE 246 (H) 04/30/2020 1003   BUN 11 04/30/2020 1003   CREATININE 1.03 (H) 04/30/2020 1003   CALCIUM 9.4 04/30/2020 1003   PROT 7.4 04/30/2020 1003   ALBUMIN 3.3 (L) 04/30/2020 1003   AST 23 04/30/2020 1003   ALT 21 04/30/2020 1003   ALKPHOS 96 04/30/2020 1003   BILITOT 0.7 04/30/2020 1003   GFRNONAA >60 04/30/2020 1003   GFRAA >60 07/02/2018 0444    Lipid Panel 2010    Component Value Date/Time   CHOL 140 10/24/2008 2021   TRIG 240 (H) 10/24/2008 2021   HDL 36 (L) 10/24/2008 2021   CHOLHDL 3.9 Ratio 10/24/2008 2021   VLDL 48 (H) 10/24/2008 2021   LDLCALC 56 10/24/2008 2021     Imaging  CT head No CT evidence of acute intracranial abnormality. Redemonstrated patchy and ill-defined hypoattenuation within the bilateral cerebral white matter, which is overall moderate in severity but advanced for age. Findings are nonspecific, but most often secondary to chronic small vessel ischemia. Mild bilateral maxillary sinus mucosal thickening.   Assessment: 60 yo female who presents by EMS after seeing her PCP today for left facial droop that began 5 days ago. She had a short period of dysarthria on the day symptoms were first experienced that quickly resolved. Has had no aphasia. No history of stroke.  1. On examination, her positive findings are left facial droop,  + finger roll test on the left, subtle LUE weakness and left sided extinction to DSS.  2. Her risk factors for stroke are HTN and DM II.  3. Overall exam findings are suggestive of a subacute right DWM/IC/BG/thalamic lacunar infarct. Due to the discrete focal findings on exam, we will proceed with MRI brain. Further plan depends on results.    Recommendations:  All of the following have been ordered except MRA.  -  Medicine admit for stroke workup.  -Get MRI brain now.   -Further testing and plan will depend on findings.  - HgbA1c, fasting lipid panel, TSH, PT/INR/aPTT - statin (Atorvastin) if LDL>70. - Tight sugar control.  - MRA of the brain without contrast plus carotid ultrasound -- may do after MRI results. Can also obtain CTA of head and neck instead of MRA/Dopplers - Frequent neuro checks - follow NIHSS - Echocardiogram - Prophylactic therapy-Antiplatelet med: Aspirin - dose 325mg  PO or 300mg  PR followed by ASA 81mg  po qd  - Plavix 75mg  po qd x 21 days - Risk factor modification - cardiac telemetry monitoring for arrhythmia - PT consult, OT consult, Speech consult - stroke education - BP management - Stroke team to follow  Pt seen by Jimmye NormanKaren Kirby-Graham, NP/Neuro  Pager: [phone]  I hve seen and examined the patient. I have reviewed and amended the assessment and recommendations. 60 year old female presenting with left facial droop. Exam reveals findings best localizable as a lacunar infarction in the right cerebral hemisphere. Recommendations as above.  Electronically signed: Dr. Caryl PinaEric Albert Hersch

## 2020-04-30 NOTE — ED Notes (Signed)
EDP made aware of pt failing swallow screen.

## 2020-04-30 NOTE — Hospital Course (Signed)
Wednesday forehead sparing Failed swallow screen`  Crystal Boyer states that she feels very well today. She said at first she was depressed and mad when she found out she had a stroke, but has come to terms with this. She says her LUE weakness is improving and she cannot appreciate any facial droop. She notes she feels better on a lower dose of Lantus (25 units compared to home 50 units) in setting of NPO status. She feels her Lantus at home may be too strong.   PE: R > L strength  Changes made: started carb/hh diet, atorva 80 Note: she is scheduled to get ASA 325 daily - check dose

## 2020-04-30 NOTE — ED Triage Notes (Signed)
Pt BIB EMS from PCP for possible concern for CVA. PCP is concerned that patient seems unlike herself and not remembering things. Pt reports she feels weak. Per EMS no last known normal . Pt reports s/s for the past 2 weeks.

## 2020-04-30 NOTE — H&P (Signed)
Date: 04/30/2020               Patient Name:  Crystal Boyer MRN: [id number]  DOB: 11-10-1960 Age / Sex: 60 y.o., female   PCP: [facility]         Medical Service: Internal Medicine Teaching Service         Attending Physician: Dr. Earl Lagos, MD    First Contact: Thalia Bloodgood, DO Pager: Theresa Duty [phone]  Second Contact: Dellia Cloud, DO Pager: [facility] [phone]       After Hours (After 5p/  First Contact Pager: [phone]  weekends / holidays): Second Contact Pager: [phone]   SUBJECTIVE   Chief Complaint: Left facial droop  History of Present Illness: Crystal Boyer is a 60 y.o. female with a pertinent PMH of T2DM, HTN, who presents to [facility] with a 2 day history of left facial droop. The history was obtained from the patient and through chart review.  Patient presents after a 1 week history of focal neurologic deficit.  Patient developed a left facial droop last Sunday when she was at dinner with her son.  She states that she does not develop any other focal neurologic deficits at this time.  She had a follow-up appointment with her primary care practitioner this morning for urinary tract infection.  The NP noticed a focal neurologic deficit and counseled her to to go to the ED.  She does admit to some associated dysphagia, mental cloudiness, and generalized weakness.  Otherwise, she denies any vision changes, headaches, dizziness, numbness/tingling, chest pain, shortness of breath.  She denies any recent illnesses.  The only new medication that she is taking is amoxicillin for her previous UTI which she has noticed some nausea, diarrhea and abdominal pain since starting this medication.  Otherwise, no recent travel and she is up-to-date on all of her vaccinations.  Past Medical History:  Diagnosis Date  . Allergy   . Asthma   . Depression   . Diabetes mellitus   . Gout 12 days ago  . Hypertension   Patient has had 2 prior C-sections and a tubal  ligation.   Medications:  Current Meds  Medication Sig  . acetaminophen (TYLENOL) 500 MG tablet Take 500 mg by mouth every 6 (six) hours as needed for mild pain.   . cetirizine (ZYRTEC ALLERGY) 10 MG tablet Take 1 tablet (10 mg total) by mouth daily.  Marland Kitchen ibuprofen (ADVIL) 200 MG tablet Take 400 mg by mouth every 6 (six) hours as needed for mild pain.  Marland Kitchen LANTUS SOLOSTAR 100 UNIT/ML Solostar Pen Inject 50 Units into the skin at bedtime.  Marland Kitchen losartan (COZAAR) 50 MG tablet Take 1 tablet (50 mg total) by mouth daily.  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 Support - Son and daughter Level of function - independent ADL/IADL PCP - Ms. Grace Blight Substance use - no exercise, eats a lot of [employer], no tobacco, no ETOH, no illicit drugs  Family History: Mom - MI Dad  - Stroke (also granddad and uncle) Siblings - no problems that she is aware of  Children - daughter with DM/Obesity  Gma Geneticist, molecular) - unknown Gpa Geneticist, molecular) - Stroke Gma (Mat) - unknown Gpa (Mat) - unknow  Allergies: Allergies as of 04/30/2020 -  Review Complete 04/30/2020  Allergen Reaction Noted  . Eggs or egg-derived products Diarrhea 03/05/2014  . Actos [pioglitazone] Other (See Comments) 01/07/2017  . Glipizide Other (See Comments) 01/07/2017  . Tramadol Rash 02/12/2013   Review of Systems: A complete ROS was negative except as per HPI.   OBJECTIVE:   Physical Exam: Blood pressure (!) 183/95, pulse 79, temperature 98.1 F (36.7 C), temperature source Oral, resp. rate 12, SpO2 100 %. Physical Exam Constitutional:      General: She is not in acute distress.    Appearance: She is obese. She is not diaphoretic.  HENT:     Head: Normocephalic and atraumatic.     Mouth/Throat:     Mouth: Mucous membranes are moist.     Pharynx: No  oropharyngeal exudate or posterior oropharyngeal erythema.  Eyes:     General: No visual field deficit.    Extraocular Movements: Extraocular movements intact.     Conjunctiva/sclera: Conjunctivae normal.     Pupils: Pupils are equal, round, and reactive to light.  Cardiovascular:     Rate and Rhythm: Normal rate and regular rhythm.     Pulses: Normal pulses.     Heart sounds: No murmur (pronouced S2) heard.   Pulmonary:     Effort: Pulmonary effort is normal.     Breath sounds: Normal breath sounds.  Abdominal:     General: Abdomen is flat. Bowel sounds are normal. There is no distension.     Palpations: Abdomen is soft.     Tenderness: There is no abdominal tenderness.  Musculoskeletal:        General: Swelling (minimal LE swelling bilaterally) present. Normal range of motion.     Cervical back: Normal range of motion.  Skin:    General: Skin is warm and dry.  Neurological:     Mental Status: She is alert and oriented to person, place, and time.     Cranial Nerves: Facial asymmetry (Lf sided) present.     Sensory: Sensation is intact.     Motor: Weakness (LUE weakness (4/5)) present. No tremor or pronator drift.     Coordination: Coordination is intact. Romberg sign negative. Finger-Nose-Finger Test and Heel to [place] Test normal.     Gait: Gait is intact.     Comments: Speech was slowed but no dysarthria or aphasia     Labs: CBC    Component Value Date/Time   WBC 5.6 04/30/2020 1003   RBC 4.69 04/30/2020 1003   HGB 13.1 04/30/2020 1003   HCT 41.0 04/30/2020 1003   PLT 239 04/30/2020 1003   MCV 87.4 04/30/2020 1003   MCH 27.9 04/30/2020 1003   MCHC 32.0 04/30/2020 1003   RDW 13.1 04/30/2020 1003   LYMPHSABS 1.6 04/30/2020 1003   MONOABS 0.4 04/30/2020 1003   EOSABS 0.2 04/30/2020 1003   BASOSABS 0.0 04/30/2020 1003     CMP     Component Value Date/Time   NA 139 04/30/2020 1003   K 4.3 04/30/2020 1003   CL 104 04/30/2020 1003   CO2 26 04/30/2020 1003    GLUCOSE 246 (H) 04/30/2020 1003   BUN 11 04/30/2020 1003   CREATININE 1.03 (H) 04/30/2020 1003   CALCIUM 9.4 04/30/2020 1003   PROT 7.4 04/30/2020 1003   ALBUMIN 3.3 (L) 04/30/2020 1003   AST 23 04/30/2020 1003   ALT 21 04/30/2020 1003   ALKPHOS 96 04/30/2020 1003   BILITOT 0.7 04/30/2020 1003   GFRNONAA >60 04/30/2020 1003   GFRAA >60  07/02/2018 0444    Imaging: CT Head Wo Contrast No CT evidence of acute intracranial abnormality. Redemonstrated patchy and ill-defined hypoattenuation within the bilateral cerebral white matter, which is overall moderate in severity but advanced for age. Findings are nonspecific, but most often secondary to chronic small vessel ischemia. Mild bilateral maxillary sinus mucosal thickening.   EKG: personally reviewed my interpretation is normal sinus rhythm   ASSESSMENT & PLAN:   Active Problems:   CVA (cerebral vascular accident) (HCC)   ANGALINA ANTE is a 60 y.o. with pertinent PMH of diabetes and hypertension who presented with left facial droop and admit for CVA on hospital day 0  #CVA Patient presents with signs and symptoms of an acute stroke.  Neurology has been consulted and we appreciate their recommendations.  CT head without contrast showed chronic small vessel ischemia, but no acute deficits.  MRI is pending.  Patient admits to some problems with dysphagia and failed bedside stroke swallow evaluation.  Therefore, we will hold off on a diet and oral medications until SLP.  I reached out to patient's nurse and confirmed that ASA was given rectally.  - MRI brain and MRA head/neck pending  - A1c and Lipid panel pending -TSH, PT/INR/APTT pending - PT/OT/SLP pending  - We will start aspirin and Plavix tomorrow -Permissive hypertension, restart blood pressure medications tomorrow - Echocardiogram pending - Statin for secondary prevention - Restart insulin therapy - Keep on telemetry overnight - Will hold off on oral medications until  SLP evaluation. ASA was given rectally.   #Type 2, DM: Patient has a history of type 2 diabetes on Lantus 50 units daily.  Patient is currently n.p.o. waiting SLP evaluation for dysphagia in the setting of CVA.  Therefore, half of her home dose of Lantus tonight and put in every 4 hours CBG monitoring.  We will increase her Lantus to 50 once diet has been resumed. -Lantus 25 units nightly while n.p.o. -Every 4 hours CBG monitoring -We will start sliding scale insulin tomorrow. -Give D10/D50 if needed for hypoglycemia while n.p.o. -A1c pending  #Protein caloric malnutrition: -We will get RT to evaluate patient  #Hx of UTI: She has a history of urinary tract infection.  She was prescribed amoxicillin in the outpatient setting.  Currently she does not have any signs or symptoms of UTI. -UA pending  #HTN: #HLD: Losartan 50 mg and amlodipine 5 mg in outpatient setting we will restart these medications tomorrow.  We will start atorvastatin 40 mg tomorrow as well.  Diet: NPO VTE: Enoxaparin IVF: None,None Code: Full  Prior to Admission Living Arrangement: Home, living Alone Anticipated Discharge Location: Home Barriers to Discharge: Work-up  Dispo: Admit patient to Observation with expected length of stay less than 2 midnights.  Signed: Chari Manning, D.O.  Internal Medicine Resident, PGY-2 [facility]  Pager: [phone] 4:24 PM, 04/30/2020   Please contact the on call pager after 5 pm and on weekends at [phone].

## 2020-05-01 ENCOUNTER — Observation Stay (HOSPITAL_BASED_OUTPATIENT_CLINIC_OR_DEPARTMENT_OTHER): Payer: Medicaid Other

## 2020-05-01 ENCOUNTER — Observation Stay (HOSPITAL_COMMUNITY): Payer: Medicaid Other

## 2020-05-01 ENCOUNTER — Other Ambulatory Visit: Payer: Self-pay | Admitting: Medical

## 2020-05-01 DIAGNOSIS — E1165 Type 2 diabetes mellitus with hyperglycemia: Secondary | ICD-10-CM

## 2020-05-01 DIAGNOSIS — I6389 Other cerebral infarction: Secondary | ICD-10-CM

## 2020-05-01 DIAGNOSIS — I639 Cerebral infarction, unspecified: Secondary | ICD-10-CM

## 2020-05-01 DIAGNOSIS — R29810 Facial weakness: Secondary | ICD-10-CM

## 2020-05-01 DIAGNOSIS — I679 Cerebrovascular disease, unspecified: Secondary | ICD-10-CM | POA: Diagnosis not present

## 2020-05-01 DIAGNOSIS — I10 Essential (primary) hypertension: Secondary | ICD-10-CM

## 2020-05-01 DIAGNOSIS — I63511 Cerebral infarction due to unspecified occlusion or stenosis of right middle cerebral artery: Secondary | ICD-10-CM

## 2020-05-01 DIAGNOSIS — E7800 Pure hypercholesterolemia, unspecified: Secondary | ICD-10-CM

## 2020-05-01 LAB — GLUCOSE, CAPILLARY
Glucose-Capillary: 104 mg/dL — ABNORMAL HIGH (ref 70–99)
Glucose-Capillary: 106 mg/dL — ABNORMAL HIGH (ref 70–99)
Glucose-Capillary: 156 mg/dL — ABNORMAL HIGH (ref 70–99)

## 2020-05-01 LAB — CBC
HCT: 37.8 % (ref 36.0–46.0)
Hemoglobin: 12.7 g/dL (ref 12.0–15.0)
MCH: 28.2 pg (ref 26.0–34.0)
MCHC: 33.6 g/dL (ref 30.0–36.0)
MCV: 83.8 fL (ref 80.0–100.0)
Platelets: 235 10*3/uL (ref 150–400)
RBC: 4.51 MIL/uL (ref 3.87–5.11)
RDW: 13.2 % (ref 11.5–15.5)
WBC: 5.1 10*3/uL (ref 4.0–10.5)
nRBC: 0 % (ref 0.0–0.2)

## 2020-05-01 LAB — COMPREHENSIVE METABOLIC PANEL
ALT: 19 U/L (ref 0–44)
AST: 22 U/L (ref 15–41)
Albumin: 2.9 g/dL — ABNORMAL LOW (ref 3.5–5.0)
Alkaline Phosphatase: 80 U/L (ref 38–126)
Anion gap: 6 (ref 5–15)
BUN: 10 mg/dL (ref 6–20)
CO2: 28 mmol/L (ref 22–32)
Calcium: 9.2 mg/dL (ref 8.9–10.3)
Chloride: 106 mmol/L (ref 98–111)
Creatinine, Ser: 0.91 mg/dL (ref 0.44–1.00)
GFR, Estimated: >60 mL/min (ref >60)
Glucose, Bld: 101 mg/dL — ABNORMAL HIGH (ref 70–99)
Potassium: 3.4 mmol/L — ABNORMAL LOW (ref 3.5–5.1)
Sodium: 140 mmol/L (ref 135–145)
Total Bilirubin: 0.4 mg/dL (ref 0.3–1.2)
Total Protein: 6.6 g/dL (ref 6.5–8.1)

## 2020-05-01 LAB — HEMOGLOBIN A1C
Hgb A1c MFr Bld: 8.2 % — ABNORMAL HIGH (ref 4.8–5.6)
Mean Plasma Glucose: 188.64 mg/dL

## 2020-05-01 LAB — ECHOCARDIOGRAM COMPLETE
Area-P 1/2: 3.61 cm2
Calc EF: 52.8 %
Height: 66 in
S' Lateral: 2.9 cm
Single Plane A2C EF: 49.6 %
Single Plane A4C EF: 56.4 %
Weight: 3332.8 oz

## 2020-05-01 LAB — LIPID PANEL
Cholesterol: 185 mg/dL (ref 0–200)
HDL: 26 mg/dL — ABNORMAL LOW (ref >40)
LDL Cholesterol: 128 mg/dL — ABNORMAL HIGH (ref 0–99)
Total CHOL/HDL Ratio: 7.1 RATIO
Triglycerides: 155 mg/dL — ABNORMAL HIGH (ref <150)
VLDL: 31 mg/dL (ref 0–40)

## 2020-05-01 LAB — MAGNESIUM: Magnesium: 1.6 mg/dL — ABNORMAL LOW (ref 1.7–2.4)

## 2020-05-01 MED ORDER — TICAGRELOR 90 MG PO TABS: 90.0000 mg | ORAL_TABLET | Freq: Two times a day (BID) | ORAL | Status: DC

## 2020-05-01 MED ORDER — MAGNESIUM SULFATE 2 GM/50ML IV SOLN
2.0000 g | Freq: Once | INTRAVENOUS | Status: AC
  Administered 2020-05-01: 2 g via INTRAVENOUS
  Filled 2020-05-01: qty 50

## 2020-05-01 MED ORDER — ATORVASTATIN CALCIUM 80 MG PO TABS
80.0000 mg | ORAL_TABLET | Freq: Every day | ORAL | Status: DC
  Administered 2020-05-01: 80 mg via ORAL
  Filled 2020-05-01: qty 1

## 2020-05-01 MED ORDER — POTASSIUM CHLORIDE CRYS ER 20 MEQ PO TBCR
40.0000 meq | EXTENDED_RELEASE_TABLET | Freq: Two times a day (BID) | ORAL | Status: DC
  Administered 2020-05-01: 40 meq via ORAL
  Filled 2020-05-01: qty 2

## 2020-05-01 MED ORDER — LANTUS SOLOSTAR 100 UNIT/ML ~~LOC~~ SOPN: 30.0000 [IU] | PEN_INJECTOR | Freq: Every day | SUBCUTANEOUS | 5 refills | Status: DC

## 2020-05-01 MED ORDER — GADOBUTROL 1 MMOL/ML IV SOLN
9.0000 mL | Freq: Once | INTRAVENOUS | Status: AC | PRN
  Administered 2020-05-01: 9 mL via INTRAVENOUS

## 2020-05-01 MED ORDER — ASPIRIN 81 MG PO TBEC: 81.0000 mg | DELAYED_RELEASE_TABLET | Freq: Every day | ORAL | 11 refills | Status: DC

## 2020-05-01 MED ORDER — CLOPIDOGREL BISULFATE 75 MG PO TABS
75.0000 mg | ORAL_TABLET | Freq: Every day | ORAL | Status: DC
  Administered 2020-05-01: 75 mg via ORAL
  Filled 2020-05-01: qty 1

## 2020-05-01 MED ORDER — TICAGRELOR 90 MG PO TABS
180.0000 mg | ORAL_TABLET | Freq: Once | ORAL | Status: AC
  Administered 2020-05-01: 180 mg via ORAL
  Filled 2020-05-01: qty 2

## 2020-05-01 MED ORDER — ASPIRIN EC 81 MG PO TBEC: 81.0000 mg | DELAYED_RELEASE_TABLET | Freq: Every day | ORAL | Status: DC

## 2020-05-01 MED ORDER — TICAGRELOR 90 MG PO TABS
90.0000 mg | ORAL_TABLET | Freq: Two times a day (BID) | ORAL | 0 refills | Status: DC
Start: 2020-05-01 — End: 2020-05-19

## 2020-05-01 MED ORDER — ASPIRIN EC 325 MG PO TBEC
325.0000 mg | DELAYED_RELEASE_TABLET | Freq: Every day | ORAL | Status: DC
  Administered 2020-05-01: 325 mg via ORAL
  Filled 2020-05-01: qty 1

## 2020-05-01 MED ORDER — IOHEXOL 350 MG/ML SOLN
75.0000 mL | Freq: Once | INTRAVENOUS | Status: AC | PRN
  Administered 2020-05-01: 75 mL via INTRAVENOUS

## 2020-05-01 MED ORDER — ATORVASTATIN CALCIUM 80 MG PO TABS
80.0000 mg | ORAL_TABLET | Freq: Every day | ORAL | 2 refills | Status: DC
Start: 2020-05-02 — End: 2020-09-02

## 2020-05-01 NOTE — Progress Notes (Signed)
   Cardiology asked to place order for 30 day monitor to further evaluate etiology of CVA. Patient does not follow with [facility]. Will place order for Dr. Jacques Navy to read (DOD in hospital 05/01/20) and arrange a follow-up appointment in 2 months to review results.  Beatriz Stallion, PA-C 05/01/20; 4:30 PM

## 2020-05-01 NOTE — Evaluation (Signed)
Occupational Therapy Evaluation Patient Details Name: Crystal Boyer MRN: [id number] DOB: 1960/02/09 Today's Date: 05/01/2020    History of Present Illness Patient is a 60 year old female with PMH T2DM, HTN, who presented to [facility] with a 2 day history of left facial droop. MRI show Foci of restricted diffusion within the right centrum semiovale and corona radiata, consistent with acute/subacute infarcts in a  watershed distribution. MRa head show severe stenosis versus subocclusive thrombus  involving a proximal right M2 branch, superior division. MRA neck show Atheromatous irregularity about the carotid bifurcations with  associated stenosis of up to 50% on the left. No hemodynamically  significant stenosis on the right. Severe stenosis versus occlusion at the origin of the right  external carotid artery. Additional approximate 50% stenosis at the  origin of the left external carotid artery.   Clinical Impression   Patient lives home alone in studio apartment with 10 steps to enter. Patient works at daycare center with infants, is independent with ADL/IADLs. Patient demonstrates independence with ADLs; LB dressing, g/h at sink, UB bathing and toilet transfer/peri care without physical assistance. Patient reports she is back to her baseline, "feel like myself." No further acute OT needs at this time, will sign off. Please re-consult if new needs arise.    Follow Up Recommendations  No OT follow up    Equipment Recommendations  Toilet riser;Tub/shower seat       Precautions / Restrictions Precautions Precautions: None Restrictions Weight Bearing Restrictions: No      Mobility Bed Mobility Overal bed mobility: Modified Independent                  Transfers Overall transfer level: Independent Equipment used: None                  Balance Overall balance assessment: Mild deficits observed, not formally tested                                          ADL either performed or assessed with clinical judgement   ADL Overall ADL's : Independent                                       General ADL Comments: patient able to perform LB dressing, sink side g/h including UB bathing, toilet transfer and peri care without any physical assistance or loss of balance noted     Vision Patient Visual Report: No change from baseline              Pertinent Vitals/Pain Pain Assessment: No/denies pain Faces Pain Scale: No hurt     Hand Dominance Right   Extremity/Trunk Assessment Upper Extremity Assessment Upper Extremity Assessment: Overall WFL for tasks assessed   Lower Extremity Assessment Lower Extremity Assessment: Defer to PT evaluation   Cervical / Trunk Assessment Cervical / Trunk Assessment: Normal   Communication Communication Communication: No difficulties   Cognition Arousal/Alertness: Awake/alert Behavior During Therapy: WFL for tasks assessed/performed Overall Cognitive Status: Within Functional Limits for tasks assessed  Home Living Family/patient expects to be discharged to:: Private residence Living Arrangements: Alone Available Help at Discharge: Friend(s);Available PRN/intermittently Type of Home: Apartment Home Access: Stairs to enter Entrance Stairs-Number of Steps: 10 Entrance Stairs-Rails: Right;Left Home Layout: One level     Bathroom Shower/Tub: Chief Strategy Officer: Standard     Home Equipment: None          Prior Functioning/Environment Level of Independence: Independent        Comments: works at [employer] day care        OT Problem List: Decreased activity tolerance         OT Goals(Current goals can be found in the care plan section) Acute Rehab OT Goals 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 OT Goal Formulation: All assessment and education complete, DC therapy   AM-PAC OT "6  Clicks" Daily Activity     Outcome Measure Help from another person eating meals?: None Help from another person taking care of personal grooming?: None Help from another person toileting, which includes using toliet, bedpan, or urinal?: None Help from another person bathing (including washing, rinsing, drying)?: None Help from another person to put on and taking off regular upper body clothing?: None Help from another person to put on and taking off regular lower body clothing?: None 6 Click Score: 24   End of Session Nurse Communication: Mobility status  Activity Tolerance: Patient tolerated treatment well Patient left: in bed;with call bell/phone within reach;with bed alarm set  OT Visit Diagnosis: Other symptoms and signs involving the nervous system (R29.898)                Time: 2542-7062 OT Time Calculation (min): 24 min Charges:  OT General Charges $OT Visit: 1 Visit OT Evaluation $OT Eval Low Complexity: 1 Low  Marlyce Huge OT OT pager: [phone]   Carmelia Roller 05/01/2020, 11:06 AM

## 2020-05-01 NOTE — Evaluation (Signed)
Physical Therapy Evaluation Patient Details Name: Crystal Boyer MRN: [id number] DOB: [birth year] Today's Date: 05/01/2020   History of Present Illness  Patient is a 60 year old female with PMH T2DM, HTN, who presented to [facility] with a 2 day history of left facial droop. MRI show Foci of restricted diffusion within the right centrum semiovale and corona radiata, consistent with acute/subacute infarcts in a  watershed distribution. MRa head show severe stenosis versus subocclusive thrombus  involving a proximal right M2 branch, superior division. MRA neck show Atheromatous irregularity about the carotid bifurcations with  associated stenosis of up to 50% on the left. No hemodynamically  significant stenosis on the right. Severe stenosis versus occlusion at the origin of the right  external carotid artery. Additional approximate 50% stenosis at the  origin of the left external carotid artery.    Clinical Impression  Pt received in bed, cooperative and pleasant. A&Ox4, able to spell "world" correctly forwards and backwards, and answers questions appropriately but decreased memory and sequencing. Seemed to have difficulty finding the word she was wanted to use during conversation. Pt generally min A - min guard for mobility. Cueing needed for sequencing on stairs and to keep moving UEs forward on rail. Pt left in chair with all needs met, call bell within reach, and chair alarm active. Pt would benefit from PT to address balance and decrease risk for a fall. Will continue to follow acutely.    Follow Up Recommendations Home health PT    Equipment Recommendations  Rolling walker with 5" wheels    Recommendations for Other Services       Precautions / Restrictions Precautions Precautions: Fall Restrictions Weight Bearing Restrictions: No      Mobility  Bed Mobility Overal bed mobility: Needs Assistance Bed Mobility: Supine to Sit     Supine to sit: Supervision;HOB elevated     General  bed mobility comments: No physical assist, increased time    Transfers Overall transfer level: Needs assistance Equipment used: None;Rolling walker (2 wheeled)             General transfer comment: On 1st and 2nd attempt with no AD, pt needing to sit down right away stating that her knees hurt and she felt weak. Used RW on 3rd attempt and height of bed elevated, min A for steadying upon stand, was able to stand up from toilet and chair at min guard level with no AD  Ambulation/Gait   Gait Distance (Feet): 35 Feet (10+25) Assistive device: None;Rolling walker (2 wheeled) Gait Pattern/deviations: Decreased stride length;Step-through pattern;Drifts right/left Gait velocity: Decreased   General Gait Details: Used RW to bathroom due to initial unsteadiness upon standing, after bathroom pt feeling better no AD used for ambulation  Stairs Stairs: Yes   Stair Management: Forwards;Alternating pattern;Two rails;Step to pattern Number of Stairs: 11 General stair comments: No LOB, alternating pattern to ascend, step-to pattern to descend  Wheelchair Mobility    Modified Rankin (Stroke Patients Only)       Balance Overall balance assessment: Needs assistance   Sitting balance-Leahy Scale: Fair Sitting balance - Comments: Posterior lean initially upon sitting at EOB but pt able to correct with cueing     Standing balance-Leahy Scale: Fair                               Pertinent Vitals/Pain Pain Assessment: No/denies pain Faces Pain Scale: No hurt    Home Living  Family/patient expects to be discharged to:: Private residence Living Arrangements: Alone Available Help at Discharge: Friend(s);Available PRN/intermittently Type of Home: Apartment Home Access: Stairs to enter Entrance Stairs-Rails: Psychiatric nurse of Steps: 10 Home Layout: One level Home Equipment: None Additional Comments: Can ask son for assistance once she gets out of hospital     Prior Function Level of Independence: Independent         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     Hand Dominance   Dominant Hand: Right    Extremity/Trunk Assessment   Upper Extremity Assessment Upper Extremity Assessment: Defer to OT evaluation    Lower Extremity Assessment Lower Extremity Assessment: Overall WFL for tasks assessed (Sensation screen: WNL, pt confusing left and right side but able to point to where she was touched; Strength screen: slightly weaker on L side but overall WFL)    Cervical / Trunk Assessment Cervical / Trunk Assessment: Normal  Communication   Communication: No difficulties  Cognition Arousal/Alertness: Awake/alert Behavior During Therapy: WFL for tasks assessed/performed Overall Cognitive Status: No family/caregiver present to determine baseline cognitive functioning                                 General Comments: A&Ox4. Decreased memory, mixing up right vs. left sides, and attention. Difficulty sequencing      General Comments      Exercises     Assessment/Plan    PT Assessment Patient needs continued PT services  PT Problem List Decreased strength;Decreased cognition;Decreased knowledge of use of DME;Decreased activity tolerance;Decreased safety awareness;Decreased balance;Decreased coordination;Decreased mobility       PT Treatment Interventions DME instruction;Balance training;Gait training;Neuromuscular re-education;Stair training;Functional mobility training;Patient/family education;Therapeutic activities;Therapeutic exercise    PT Goals (Current goals can be found in the Care Plan section)  Acute Rehab PT Goals 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    Frequency Min 3X/week   Barriers to discharge        Co-evaluation               AM-PAC PT "6 Clicks" Mobility  Outcome Measure Help needed turning from your back to your side while in a flat bed without using bedrails?: None Help  needed moving from lying on your back to sitting on the side of a flat bed without using bedrails?: A Little Help needed moving to and from a bed to a chair (including a wheelchair)?: A Little Help needed standing up from a chair using your arms (e.g., wheelchair or bedside chair)?: A Little Help needed to walk in hospital room?: A Little Help needed climbing 3-5 steps with a railing? : A Little 6 Click Score: 19    End of Session Equipment Utilized During Treatment: Gait belt Activity Tolerance: Patient tolerated treatment well Patient left: in chair;with call bell/phone within reach;with chair alarm set Nurse Communication: Mobility status PT Visit Diagnosis: Unsteadiness on feet (R26.81);Other abnormalities of gait and mobility (R26.89)    Time: 0768-0881 PT Time Calculation (min) (ACUTE ONLY): 42 min   Charges:   PT Evaluation $PT Eval Moderate Complexity: 1 Mod (medicaid eval)     Rosita Kea, SPT

## 2020-05-01 NOTE — Progress Notes (Signed)
HD#0 Subjective:  Overnight Events: Admitted overnight  Patient resting comfortably in bed.  She denies any acute complaints.  She states that she is really scared to hear that she likely had a stroke.  Otherwise she denies any acute complaints at this time.  Objective:  Vital signs in last 24 hours: Vitals:   05/01/20 0345 05/01/20 0442 05/01/20 0545 05/01/20 1205  BP: (!) 161/99  (!) 168/91 (!) 170/92  Pulse: 72  75 76  Resp: 18  20 16   Temp: 98 F (36.7 C)  98.1 F (36.7 C) 98.4 F (36.9 C)  TempSrc: Oral  Oral Oral  SpO2: 99%  100% 100%  Weight:  94.5 kg    Height:       Supplemental O2: Room Air SpO2: 100 %   Physical Exam:  Physical Exam Constitutional:      Appearance: Normal appearance.  HENT:     Head: Normocephalic and atraumatic.  Eyes:     General: No visual field deficit.    Extraocular Movements: Extraocular movements intact.  Cardiovascular:     Rate and Rhythm: Normal rate.     Pulses: Normal pulses.     Heart sounds: Normal heart sounds.  Pulmonary:     Effort: Pulmonary effort is normal.     Breath sounds: Normal breath sounds.  Abdominal:     General: Bowel sounds are normal.     Palpations: Abdomen is soft.     Tenderness: There is no abdominal tenderness.  Musculoskeletal:        General: Normal range of motion.     Cervical back: Normal range of motion.     Right lower leg: No edema.     Left lower leg: No edema.  Skin:    General: Skin is warm and dry.  Neurological:     Mental Status: She is alert and oriented to person, place, and time. Mental status is at baseline.     Cranial Nerves: Facial asymmetry (mild, but improved) present. No dysarthria.     Motor: Weakness (mild LUE weakness ~ 4/5) present. No tremor.     Coordination: Coordination normal.  Psychiatric:        Mood and Affect: Mood normal.      Filed Weights   05/01/20 0442  Weight: 94.5 kg    No intake or output data in the 24 hours ending 05/01/20  1312 Net IO Since Admission: No IO data has been entered for this period [05/01/20 1312]  Pertinent Labs: CBC Latest Ref Rng & Units 05/01/2020 04/30/2020 04/20/2020  WBC 4.0 - 10.5 K/uL 5.1 5.6 6.0  Hemoglobin 12.0 - 15.0 g/dL 04/22/2020 37.3 42.8  Hematocrit 36.0 - 46.0 % 37.8 41.0 41.0  Platelets 150 - 400 K/uL 235 239 240    CMP Latest Ref Rng & Units 05/01/2020 04/30/2020 04/20/2020  Glucose 70 - 99 mg/dL 04/22/2020) 115(B) 262(M)  BUN 6 - 20 mg/dL 10 11 16   Creatinine 0.44 - 1.00 mg/dL 355(H ) 7.41  Sodium 135 - 145 mmol/L 140 139 139  Potassium 3.5 - 5.1 mmol/L 3.4(L) 4.3 3.5  Chloride 98 - 111 mmol/L 106 104 105  CO2 22 - 32 mmol/L 28 26 28   Calcium 8.9 - 10.3 mg/dL 9.2 9.4 9.3  Total Protein 6.5 - 8.1 g/dL 6.6 7.4 7.5  Total Bilirubin 0.3 - 1.2 mg/dL 0.4 0.7 1.0  Alkaline Phos 38 - 126 U/L 80 96 95  AST 15 - 41 U/L 22 23 21  ALT 0 - 44 U/L 19 21 22     Assessment/Plan:   Active Problems:   CVA (cerebral vascular accident) [facility])   Patient Summary: SONAL DORWART is a 60 y.o. with pertinent PMH of diabetes and hypertension who presented with left facial droop and admit for CVA on hospital day 1.  #CVA Patient still has some mild signs of stroke but her weakness and facial asymmetry is improving.  We will continue aspirin and Plavix and statin.  Appreciate neurology following along with this patient. -PT/OT -We will start adding back antihypertensive medications as needed -Appreciate neurology's recommendations -ASA/Clopedegril/atorvastatin  #Type 2, DM: - Lantus 25 units qHS - SSI and q4 hours CBGs  - Will likely restart metformin tomorrow.   #HTN: #HLD: - Restarted antihypertensive medications as needed -  Continue ASA/plavix   Diet: Heart Healthy/CM IVF: None,None VTE: Enoxaparin Code: Full PT/OT recs: Home Health TOC recs: none Family Update: Patient notified family    Dispo: Anticipated discharge to Home in 1 days pending echo with bubble study.    60 DO Internal Medicine Resident PGY-1 Pager [phone] Please contact the on call pager after 5 pm and on weekends at [phone].

## 2020-05-01 NOTE — Progress Notes (Signed)
PT Cancellation Note  Patient Details Name: Crystal Boyer MRN: [id number] DOB: [birth year]   Cancelled Treatment:    Reason Eval/Treat Not Completed: Other (comment) patient working with other therapies, not available for PT. Will return later in AM.    Lerry Liner PT, DPT, PN1   Supplemental Physical Therapist [facility]    Pager [phone] Acute Rehab Office [phone]

## 2020-05-01 NOTE — Progress Notes (Signed)
  Echocardiogram 2D Echocardiogram has been performed.  Crystal Boyer 05/01/2020, 8:26 AM

## 2020-05-01 NOTE — Progress Notes (Signed)
TCD w/ bubble study completed.   Please see CV Proc for preliminary results.   Natali Lavallee, RDMS, RVT  

## 2020-05-01 NOTE — Progress Notes (Signed)
RN gave patient discharge instructions and went over pt new medications in detail. Patient stated understanding, IV has been removed.

## 2020-05-01 NOTE — Progress Notes (Signed)
Date: 05/01/2020  Patient name: Crystal Boyer  Medical record number: [id number]  Date of birth: 08-28-1960   I have seen and evaluated Crystal Boyer and discussed their care with the Residency Team.  In brief, patient is a 60 year old female with a past medical history of type 2 diabetes, hypertension, asthma who presented to the ED with left facial droop x1 week.  Patient states that approximately 1 week ago she developed a left facial droop when she was at dinner with her son.  She does not have any other neurological symptoms at that time.  Patient went to see her NP yesterday for a urinary tract infection and they noted left-sided facial droop as well as possible left-sided weakness and transferred to the ED for further evaluation.  Patient does state that over the last week she noted some difficulty swallowing, cognitive slowing as well as generalized weakness.  No headache, no fevers or chills, no lightheadedness, no syncope, no chest pain, no shortness of breath, no palpitations, no diaphoresis, no nausea or vomiting, no diarrhea, no abdominal pain.  Today, patient states that she is feeling better and that the strength in her left arm is returning.  PMHx, Fam Hx, and/or Soc Hx : As per resident admit note  Vitals:   05/01/20 0545 05/01/20 1205  BP: (!) 168/91 (!) 170/92  Pulse: 75 76  Resp: 20 16  Temp: 98.1 F (36.7 C) 98.4 F (36.9 C)  SpO2: 100% 100%   General: Awake, alert, oriented x3, NAD CVS: Regular rate and rhythm, normal heart sounds Lungs: CTA bilaterally Abdomen: Soft, nontender, nondistended, normoactive bowel sounds Extremities: No edema noted, nontender to palpation Psych: Normal mood and affect HEENT: Normocephalic, atraumatic Neuro: Patient is oriented x3, left facial droop noted, no dysarthria, shoulder shrug intact, sensation intact upper and lower extremities as well as face, tongue central, power 4 out of 5 left upper extremity but otherwise 5  out of 5 in right upper and bilateral lower extremities.  Assessment and Plan: I have seen and evaluated the patient as outlined above. I agree with the formulated Assessment and Plan as detailed in the residents' note, with the following changes:   1.  Acute right MCA CVA: -Patient presented to ED with left-sided facial droop as well as left-sided weakness from her NP office.  Unfortunately, patient symptoms developed approximately 1 week prior to her admission and she was not eligible for TPA or thrombectomy. -MRI brain showed right-sided MCA CVA watershed infarct.  MRA head and neck showed severe M2 segment stenosis intracranially as well as possible stenosis at the origin of her right carotid artery.  CTA head/neck showed no evidence of severe stenosis in her external carotid arteries. -2D echo with normal EF and right ventricular function.  No thrombus noted -PT/OT follow-up and recommendations appreciated.  Recommending home PT as well as rolling walker. -Patient did have some dysphagia at home and the failed bedside swallow.  SLP follow-up recommendations appreciated.  Recommend a regular diet with thin liquids -We will continue with aspirin 81 mg and Plavix 75 mg for 3 weeks and then aspirin alone -Patient noted to have an elevated LDL.  We will start atorvastatin 80 mg for secondary prophylaxis -Patient also has a history of hypertension.  Will restart home antihypertensive medications given that patient is out of the permissive hypertensive window -Patient also history of type 2 diabetes with an A1c in the 8.  She will need better control of her type 2 diabetes.  Patient to follow-up with her PCP for diabetes control.  She would likely benefit from initiation of Metformin as well as an SGLT2 inhibitor/GLP-1 agonist given her recent stroke. -Resident discussed case with stroke team.  Patient is stable for DC home today with home PT -No further work-up at this time.  Will DC home  today  Earl Lagos, MD 3/25/20222:55 PM

## 2020-05-01 NOTE — Evaluation (Signed)
Clinical/Bedside Swallow Evaluation Patient Details  Name: Crystal Boyer MRN: [id number] Date of Birth: 1960-03-15  Today's Date: 05/01/2020 Time: SLP Start Time (ACUTE ONLY): 0902 SLP Stop Time (ACUTE ONLY): 0924 SLP Time Calculation (min) (ACUTE ONLY): 22 min  Past Medical History:  Past Medical History:  Diagnosis Date  . Allergy   . Asthma   . Depression   . Diabetes mellitus   . Gout 12 days ago  . Hypertension    Past Surgical History:  Past Surgical History:  Procedure Laterality Date  . ABDOMINAL HYSTERECTOMY    . ANKLE FRACTURE SURGERY     right ankle  . CESAREAN SECTION     HPI:  Pt is a 60 y.o. female presenting with a CVA. Patient presents 1 week history of  left facial droop.  MRI 04/20/20: Foci of restricted diffusion within the right centrum semiovale and corona radiata, consistent with acute/subacute infarcts in a watershed distribution. PMH: DM, HTN, asthma.   Assessment / Plan / Recommendation Clinical Impression  Pt presents with an oropharyngeal swallow that is [facility] despite L facial droop. No overt s/s of aspiration were noted during or after all PO intake even when challenged with consecutive sips of larger volumes of water. Recommend a regular diet with thin liquids and SLP will sign off for dysphagia at this time. Given recent CVA results from MRI, recommend consideration for a Speech-Language Evaluation to be ordered.   SLP Visit Diagnosis: Dysphagia, unspecified (R13.10)    Aspiration Risk  Mild aspiration risk    Diet Recommendation Regular;Thin liquid   Liquid Administration via: Cup;Straw Medication Administration: Whole meds with liquid Supervision: Patient able to self feed;Intermittent supervision to cue for compensatory strategies Compensations: Slow rate;Small sips/bites Postural Changes: Seated upright at 90 degrees    Other  Recommendations Oral Care Recommendations: Oral care BID   Follow up Recommendations None      Frequency  and Duration            Prognosis Prognosis for Safe Diet Advancement: Good      Swallow Study   General HPI: Pt is a 60 y.o. female presenting with a CVA. Patient presents 1 week history of  left facial droop.  MRI 04/20/20: Foci of restricted diffusion within the right centrum semiovale and corona radiata, consistent with acute/subacute infarcts in a watershed distribution. PMH: DM, HTN, asthma. Type of Study: Bedside Swallow Evaluation Previous Swallow Assessment: None in chart Diet Prior to this Study: NPO Temperature Spikes Noted: No Respiratory Status: Room air History of Recent Intubation: No Behavior/Cognition: Alert;Cooperative;Pleasant mood Oral Cavity Assessment: Within Functional Limits Oral Care Completed by SLP: No Oral Cavity - Dentition: Adequate natural dentition Vision: Functional for self-feeding Self-Feeding Abilities: Able to feed self Patient Positioning: Upright in bed Baseline Vocal Quality: Normal Volitional Cough: Strong Volitional Swallow: Able to elicit    Oral/Motor/Sensory Function Overall Oral Motor/Sensory Function: Mild impairment Facial ROM: Reduced left;Suspected CN VII (facial) dysfunction Facial Symmetry: Abnormal symmetry left;Suspected CN VII (facial) dysfunction Facial Strength: Suspected CN VII (facial) dysfunction;Reduced left Lingual ROM: Within Functional Limits Lingual Symmetry: Within Functional Limits Lingual Strength: Within Functional Limits Lingual Sensation: Within Functional Limits Mandible: Impaired;Suspected CN V (Trigeminal) dysfunction   Ice Chips Ice chips: Within functional limits Presentation: Spoon;Self Fed   Thin Liquid Thin Liquid: Within functional limits Presentation: Spoon;Self Fed;Cup;Straw    Nectar Thick Nectar Thick Liquid: Not tested   Honey Thick Honey Thick Liquid: Not tested   Puree Puree: Within functional limits Presentation:  Self Fed;Spoon   Solid     Solid: Within functional  limits Presentation: Self Fed;Spoon      Zettie Cooley., SLP Student 05/01/2020,10:02 AM

## 2020-05-01 NOTE — Progress Notes (Addendum)
STROKE TEAM PROGRESS NOTE   INTERVAL HISTORY No acute events overnight.  VSS. Remains hypertensive BP 150-170 systolic in setting of permissive hypertension.  She feels overall better today. Still noticing left hand is "just not working right" and some balance difficulty with walking. Denies new or worsening numbness/tingling/weakness. No headache or vision disturbance.   We extensively discussed her stroke diagnosis and plan of care. Imaging was reviewed with her. She was presented with treatment alternatives with discussion of risks and benefits. She chose to dc home today and follow up next week with Dr. Corliss Skains re: possible cerebral angiogram/stenting. She reports her son will stay with her until that time. Questions were answered.   Her Vitals:   05/01/20 0345 05/01/20 0442 05/01/20 0545 05/01/20 1205  BP: (!) 161/99  (!) 168/91 (!) 170/92  Pulse: 72  75 76  Resp: 18  20 16   Temp: 98 F (36.7 C)  98.1 F (36.7 C) 98.4 F (36.9 C)  TempSrc: Oral  Oral Oral  SpO2: 99%  100% 100%  Weight:  94.5 kg    Height:       CBC:  Recent Labs  Lab 04/30/20 1003 05/01/20 0349  WBC 5.6 5.1  NEUTROABS 3.4  --   HGB 13.1 12.7  HCT 41.0 37.8  MCV 87.4 83.8  PLT 239 235   Basic Metabolic Panel:  Recent Labs  Lab 04/30/20 1003 04/30/20 1840 05/01/20 0349  NA 139  --  140  K 4.3  --  3.4*  CL 104  --  106  CO2 26  --  28  GLUCOSE 246*  --  101*  BUN 11  --  10  CREATININE 1.03*  --  0.91  CALCIUM 9.4  --  9.2  MG  --  1.6* 1.6*  PHOS  --  3.3  --    Lipid Panel:  Recent Labs  Lab 05/01/20 0349  CHOL 185  TRIG 155*  HDL 26*  CHOLHDL 7.1  VLDL 31  LDLCALC 05/03/20*   HgbA1c:  Recent Labs  Lab 05/01/20 0713  HGBA1C 8.2*   Urine Drug Screen: No results for input(s): LABOPIA, COCAINSCRNUR, LABBENZ, AMPHETMU, THCU, LABBARB in the last 168 hours.  Alcohol Level No results for input(s): ETH in the last 168 hours.  IMAGING past 24 hours CT ANGIO HEAD W OR WO  CONTRAST  Result Date: 05/01/2020 CLINICAL DATA:  Neuro deficit, acute, stroke suspected. Follow-up stroke. EXAM: CT ANGIOGRAPHY HEAD AND NECK TECHNIQUE: Multidetector CT imaging of the head and neck was performed using the standard protocol during bolus administration of intravenous contrast. Multiplanar CT image reconstructions and MIPs were obtained to evaluate the vascular anatomy. Carotid stenosis measurements (when applicable) are obtained utilizing NASCET criteria, using the distal internal carotid diameter as the denominator. CONTRAST:  43mL OMNIPAQUE IOHEXOL 350 MG/ML SOLN COMPARISON:  MRI brain 04/30/2020.  MRA head/neck 05/01/2020. FINDINGS: CTA NECK FINDINGS Aortic arch: Standard aortic branching. Atherosclerotic plaque within the visualized aortic arch and proximal major branch vessels of the neck. No hemodynamically significant innominate or proximal subclavian artery stenosis. Right carotid system: CCA and ICA patent within the neck without significant stenosis (50% or greater). Mild soft and calcified plaque within the carotid bifurcation and proximal ICA. Left carotid system: CCA and ICA patent within the neck without significant stenosis (50% or greater). Mild soft and calcified plaque within the carotid bifurcation and proximal ICA. Vertebral arteries: Codominant patent within the neck without stenosis. Skeleton: Cervical spondylosis. No acute bony abnormality  or aggressive osseous lesion. Partially imaged thoracic dextrocurvature Other neck: No neck mass or cervical lymphadenopathy. Thyroid unremarkable. Upper chest: No consolidation within the imaged lung apices. Review of the MIP images confirms the above findings CTA HEAD FINDINGS Anterior circulation: The intracranial internal carotid arteries are patent. Calcified plaque within both vessels. Mild to moderate stenosis of the paraclinoid segments bilaterally. Asymmetric M1 MCA branching. Mild atherosclerotic narrowing of the distal M1 right  MCA. Redemonstrated short-segment apparent flow gap within a superior division proximal M2 right MCA branch vessel compatible with severe, near occlusive stenosis or near occlusive thrombus (series 11, image 21) (series 13, image 17). The left M1 middle cerebral artery is patent. No left M2 proximal branch occlusion or high-grade proximal stenosis. The anterior cerebral arteries are patent. No intracranial aneurysm is identified. Posterior circulation: The intracranial vertebral arteries are patent. Calcified plaque results in mild stenosis of the V4 right vertebral artery. Mild nonstenotic calcified plaque within the V4 left vertebral artery. The basilar artery is patent. The posterior cerebral arteries are patent. Mild atherosclerotic narrowing within the right PCA at the P1/P2 junction. Posterior communicating arteries are hypoplastic or absent bilaterally. Venous sinuses: Within the limitations of contrast timing, no convincing thrombus. Anatomic variants: As described Review of the MIP images confirms the above findings IMPRESSION: CTA neck: 1. The common carotid and internal carotid arteries are patent within the neck without hemodynamically significant stenosis. Mild atherosclerotic plaque within the carotid bifurcations and proximal ICAs bilaterally. 2. Vertebral arteries patent within the neck without stenosis. CTA head: 1. Unchanged short-segment apparent flow gap within a superior division proximal M2 right MCA branch vessel. Findings are compatible with severe, near occlusive stenosis versus near occlusive thrombus. 2. Mild stenosis of the distal M1 right middle cerebral artery. 3. Calcified plaque within the intracranial ICAs with mild/moderate stenosis of the paraclinoid segments bilaterally. 4. Mild atherosclerotic narrowing of the V4 right vertebral artery. Electronically Signed   By: Jackey Loge DO   On: 05/01/2020 12:24   CT ANGIO NECK W OR WO CONTRAST  Result Date: 05/01/2020 CLINICAL DATA:   Neuro deficit, acute, stroke suspected. Follow-up stroke. EXAM: CT ANGIOGRAPHY HEAD AND NECK TECHNIQUE: Multidetector CT imaging of the head and neck was performed using the standard protocol during bolus administration of intravenous contrast. Multiplanar CT image reconstructions and MIPs were obtained to evaluate the vascular anatomy. Carotid stenosis measurements (when applicable) are obtained utilizing NASCET criteria, using the distal internal carotid diameter as the denominator. CONTRAST:  75mL OMNIPAQUE IOHEXOL 350 MG/ML SOLN COMPARISON:  MRI brain 04/30/2020.  MRA head/neck 05/01/2020. FINDINGS: CTA NECK FINDINGS Aortic arch: Standard aortic branching. Atherosclerotic plaque within the visualized aortic arch and proximal major branch vessels of the neck. No hemodynamically significant innominate or proximal subclavian artery stenosis. Right carotid system: CCA and ICA patent within the neck without significant stenosis (50% or greater). Mild soft and calcified plaque within the carotid bifurcation and proximal ICA. Left carotid system: CCA and ICA patent within the neck without significant stenosis (50% or greater). Mild soft and calcified plaque within the carotid bifurcation and proximal ICA. Vertebral arteries: Codominant patent within the neck without stenosis. Skeleton: Cervical spondylosis. No acute bony abnormality or aggressive osseous lesion. Partially imaged thoracic dextrocurvature Other neck: No neck mass or cervical lymphadenopathy. Thyroid unremarkable. Upper chest: No consolidation within the imaged lung apices. Review of the MIP images confirms the above findings CTA HEAD FINDINGS Anterior circulation: The intracranial internal carotid arteries are patent. Calcified plaque within both vessels.  Mild to moderate stenosis of the paraclinoid segments bilaterally. Asymmetric M1 MCA branching. Mild atherosclerotic narrowing of the distal M1 right MCA. Redemonstrated short-segment apparent flow gap  within a superior division proximal M2 right MCA branch vessel compatible with severe, near occlusive stenosis or near occlusive thrombus (series 11, image 21) (series 13, image 17). The left M1 middle cerebral artery is patent. No left M2 proximal branch occlusion or high-grade proximal stenosis. The anterior cerebral arteries are patent. No intracranial aneurysm is identified. Posterior circulation: The intracranial vertebral arteries are patent. Calcified plaque results in mild stenosis of the V4 right vertebral artery. Mild nonstenotic calcified plaque within the V4 left vertebral artery. The basilar artery is patent. The posterior cerebral arteries are patent. Mild atherosclerotic narrowing within the right PCA at the P1/P2 junction. Posterior communicating arteries are hypoplastic or absent bilaterally. Venous sinuses: Within the limitations of contrast timing, no convincing thrombus. Anatomic variants: As described Review of the MIP images confirms the above findings IMPRESSION: CTA neck: 1. The common carotid and internal carotid arteries are patent within the neck without hemodynamically significant stenosis. Mild atherosclerotic plaque within the carotid bifurcations and proximal ICAs bilaterally. 2. Vertebral arteries patent within the neck without stenosis. CTA head: 1. Unchanged short-segment apparent flow gap within a superior division proximal M2 right MCA branch vessel. Findings are compatible with severe, near occlusive stenosis versus near occlusive thrombus. 2. Mild stenosis of the distal M1 right middle cerebral artery. 3. Calcified plaque within the intracranial ICAs with mild/moderate stenosis of the paraclinoid segments bilaterally. 4. Mild atherosclerotic narrowing of the V4 right vertebral artery. Electronically Signed   By: Jackey Loge DO   On: 05/01/2020 12:24   MR ANGIO HEAD WO CONTRAST  Result Date: 05/01/2020 CLINICAL DATA:  Initial evaluation for follow-up examination for acute  stroke. EXAM: MRA NECK WITHOUT AND WITH CONTRAST MRA HEAD WITHOUT CONTRAST TECHNIQUE: Multiplanar and multiecho pulse sequences of the neck were obtained without and with intravenous contrast. Angiographic images of the neck were obtained using MRA technique without and with intravenous contrast; Angiographic images of the Circle of Willis were obtained using MRA technique without intravenous contrast. CONTRAST:  73mL GADAVIST GADOBUTROL 1 MMOL/ML IV SOLN COMPARISON:  Prior brain MRI from 04/30/2020 FINDINGS: MRA NECK FINDINGS AORTIC ARCH: Examination technically limited by timing the contrast bolus, with the majority of the contrast in the venous system. Visualized aortic arch normal in caliber with normal 3 vessel morphology. No hemodynamically significant stenosis seen about the origin of the great vessels. Visualized subclavian arteries patent without stenosis. RIGHT CAROTID SYSTEM: Right CCA patent from its origin to the bifurcation without stenosis. Atheromatous irregularity about the right carotid bulb/proximal right ICA without hemodynamically significant stenosis. Right ICA patent distally to the skull base without stenosis, evidence for dissection or occlusion. There is apparent severe stenosis versus occlusion at the origin of the right external carotid artery (series 1502, image 57). LEFT CAROTID SYSTEM: Left CCA patent from its origin to the bifurcation without stenosis. Atheromatous irregularity about the left carotid bulb/proximal left ICA with associated stenosis of up to 50% by NASCET criteria. Left ICA patent distally without stenosis, evidence for dissection or occlusion. Additional moderate approximate 50% stenosis noted at the origin of the left external carotid artery as well. VERTEBRAL ARTERIES: Both vertebral arteries arise from the subclavian arteries. No proximal subclavian artery stenosis. Vertebral arteries somewhat tortuous but appear to be widely patent within the neck without stenosis,  evidence for dissection or occlusion. MRA HEAD FINDINGS  ANTERIOR CIRCULATION: Examination degraded by motion artifact. Both internal carotid arteries patent to the termini without significant stenosis or other abnormality. A1 segments patent. Normal anterior communicating artery complex. Anterior cerebral arteries patent to their distal aspects without stenosis. No M1 stenosis or occlusion. Normal MCA bifurcations. On the right, there is either short-segment severe stenosis versus subocclusive thrombus involving a proximal right M2 branch, superior division (series 12, images 141-144) vessel is perfused distally. Remainder of the right MCA branches perfused. Contralateral left MCA branches widely patent and well perfused. POSTERIOR CIRCULATION: Both V4 segments patent to the vertebrobasilar junction without stenosis. Right PICA origin faintly visible and patent. Basilar patent to its distal aspect without stenosis. Superior cerebellar arteries patent bilaterally. Both PCAs primarily supplied via the basilar well perfused or distal aspects. No intracranial aneurysm. IMPRESSION: MRA HEAD IMPRESSION: 1. Short-segment severe stenosis versus subocclusive thrombus involving a proximal right M2 branch, superior division. 2. Otherwise wide patency of the intracranial arterial vasculature. No other hemodynamically significant or correctable stenosis. No aneurysm. MRA NECK IMPRESSION: 1. Atheromatous irregularity about the carotid bifurcations with associated stenosis of up to 50% on the left. No hemodynamically significant stenosis on the right. 2. Severe stenosis versus occlusion at the origin of the right external carotid artery. Additional approximate 50% stenosis at the origin of the left external carotid artery. 3. Wide patency of both vertebral arteries within the neck. Electronically Signed   By: Rise Mu M.D.   On: 05/01/2020 02:35   MR ANGIO NECK W WO CONTRAST  Result Date: 05/01/2020 CLINICAL DATA:   Initial evaluation for follow-up examination for acute stroke. EXAM: MRA NECK WITHOUT AND WITH CONTRAST MRA HEAD WITHOUT CONTRAST TECHNIQUE: Multiplanar and multiecho pulse sequences of the neck were obtained without and with intravenous contrast. Angiographic images of the neck were obtained using MRA technique without and with intravenous contrast; Angiographic images of the Circle of Willis were obtained using MRA technique without intravenous contrast. CONTRAST:  9mL GADAVIST GADOBUTROL 1 MMOL/ML IV SOLN COMPARISON:  Prior brain MRI from 04/30/2020 FINDINGS: MRA NECK FINDINGS AORTIC ARCH: Examination technically limited by timing the contrast bolus, with the majority of the contrast in the venous system. Visualized aortic arch normal in caliber with normal 3 vessel morphology. No hemodynamically significant stenosis seen about the origin of the great vessels. Visualized subclavian arteries patent without stenosis. RIGHT CAROTID SYSTEM: Right CCA patent from its origin to the bifurcation without stenosis. Atheromatous irregularity about the right carotid bulb/proximal right ICA without hemodynamically significant stenosis. Right ICA patent distally to the skull base without stenosis, evidence for dissection or occlusion. There is apparent severe stenosis versus occlusion at the origin of the right external carotid artery (series 1502, image 57). LEFT CAROTID SYSTEM: Left CCA patent from its origin to the bifurcation without stenosis. Atheromatous irregularity about the left carotid bulb/proximal left ICA with associated stenosis of up to 50% by NASCET criteria. Left ICA patent distally without stenosis, evidence for dissection or occlusion. Additional moderate approximate 50% stenosis noted at the origin of the left external carotid artery as well. VERTEBRAL ARTERIES: Both vertebral arteries arise from the subclavian arteries. No proximal subclavian artery stenosis. Vertebral arteries somewhat tortuous but appear  to be widely patent within the neck without stenosis, evidence for dissection or occlusion. MRA HEAD FINDINGS ANTERIOR CIRCULATION: Examination degraded by motion artifact. Both internal carotid arteries patent to the termini without significant stenosis or other abnormality. A1 segments patent. Normal anterior communicating artery complex. Anterior cerebral arteries patent to their  distal aspects without stenosis. No M1 stenosis or occlusion. Normal MCA bifurcations. On the right, there is either short-segment severe stenosis versus subocclusive thrombus involving a proximal right M2 branch, superior division (series 12, images 141-144) vessel is perfused distally. Remainder of the right MCA branches perfused. Contralateral left MCA branches widely patent and well perfused. POSTERIOR CIRCULATION: Both V4 segments patent to the vertebrobasilar junction without stenosis. Right PICA origin faintly visible and patent. Basilar patent to its distal aspect without stenosis. Superior cerebellar arteries patent bilaterally. Both PCAs primarily supplied via the basilar well perfused or distal aspects. No intracranial aneurysm. IMPRESSION: MRA HEAD IMPRESSION: 1. Short-segment severe stenosis versus subocclusive thrombus involving a proximal right M2 branch, superior division. 2. Otherwise wide patency of the intracranial arterial vasculature. No other hemodynamically significant or correctable stenosis. No aneurysm. MRA NECK IMPRESSION: 1. Atheromatous irregularity about the carotid bifurcations with associated stenosis of up to 50% on the left. No hemodynamically significant stenosis on the right. 2. Severe stenosis versus occlusion at the origin of the right external carotid artery. Additional approximate 50% stenosis at the origin of the left external carotid artery. 3. Wide patency of both vertebral arteries within the neck. Electronically Signed   By: Rise Mu M.D.   On: 05/01/2020 02:35   MR BRAIN WO  CONTRAST  Result Date: 04/30/2020 CLINICAL DATA:  Neuro deficit, acute, stroke suspected. EXAM: MRI HEAD WITHOUT CONTRAST TECHNIQUE: Multiplanar, multiecho pulse sequences of the brain and surrounding structures were obtained without intravenous contrast. COMPARISON:  Head CT April 30, 2020. FINDINGS: Brain: Foci of restricted diffusion are seen within the right centrum semiovale and corona radiata, consistent with acute/subacute infarcts. No hemorrhage, hydrocephalus, extra-axial collection or mass lesion. Scattered and confluent foci of T2 hyperintensity are seen within the white matter of the cerebral hemispheres, nonspecific, most likely related to chronic microangiopathy. Prominent diffuse perivascular spaces throughout the cerebral hemispheres. Vascular: Normal flow voids. Skull and upper cervical spine: Normal marrow signal. Sinuses/Orbits: Bilateral proptosis.  Paranasal sinuses are clear. IMPRESSION: 1. Foci of restricted diffusion within the right centrum semiovale and corona radiata, consistent with acute/subacute infarcts in a watershed distribution. 2. Moderate chronic microangiopathy. Findings communicated to Dr. Wilford Corner via secure text paging on 04/30/2020 at 7:03 p.m. Electronically Signed   By: Baldemar Lenis M.D.   On: 04/30/2020 19:03   ECHOCARDIOGRAM COMPLETE  Result Date: 05/01/2020    ECHOCARDIOGRAM REPORT   Patient Name:   Crystal Boyer Date of Exam: 05/01/2020 Medical Rec #:  [id number]           Height:       66.0 in Accession #:    [id number]          Weight:       208.3 lb Date of Birth:  [birth year]           BSA:          2.035 m Patient Age:    59 years            BP:           168/91 mmHg Patient Gender: F                   HR:           75 bpm. Exam Location:  Inpatient Procedure: 2D Echo, Cardiac Doppler and Color Doppler Indications:    Stroke I63.9  History:        Patient has no prior history of  Echocardiogram examinations.                 Risk  Factors:Hypertension, Diabetes and Non-Smoker.  Sonographer:    Renella Cunas RDCS Referring Phys: [id number] [facility]  Sonographer Comments: Image acquisition challenging due to respiratory motion. IMPRESSIONS  1. Left ventricular ejection fraction, by estimation, is 55 to 60%. The left ventricle has normal function. The left ventricle has no regional wall motion abnormalities. Left ventricular diastolic parameters are indeterminate.  2. Right ventricular systolic function is normal. The right ventricular size is normal. Tricuspid regurgitation signal is inadequate for assessing PA pressure.  3. The mitral valve is normal in structure. No evidence of mitral valve regurgitation. No evidence of mitral stenosis.  4. The aortic valve is normal in structure. Aortic valve regurgitation is not visualized. Mild aortic valve sclerosis is present, with no evidence of aortic valve stenosis.  5. There is mild dilatation of the ascending aorta, measuring 37 mm.  6. The inferior vena cava is normal in size with greater than 50% respiratory variability, suggesting right atrial pressure of 3 mmHg. FINDINGS  Left Ventricle: Left ventricular ejection fraction, by estimation, is 55 to 60%. The left ventricle has normal function. The left ventricle has no regional wall motion abnormalities. The left ventricular internal cavity size was normal in size. There is  no left ventricular hypertrophy. Left ventricular diastolic parameters are indeterminate. Right Ventricle: The right ventricular size is normal. No increase in right ventricular wall thickness. Right ventricular systolic function is normal. Tricuspid regurgitation signal is inadequate for assessing PA pressure. Left Atrium: Left atrial size was normal in size. Right Atrium: Right atrial size was normal in size. Pericardium: There is no evidence of pericardial effusion. Mitral Valve: The mitral valve is normal in structure. No evidence of mitral valve regurgitation. No  evidence of mitral valve stenosis. Tricuspid Valve: The tricuspid valve is normal in structure. Tricuspid valve regurgitation is not demonstrated. No evidence of tricuspid stenosis. Aortic Valve: The aortic valve is normal in structure. Aortic valve regurgitation is not visualized. Mild aortic valve sclerosis is present, with no evidence of aortic valve stenosis. Pulmonic Valve: The pulmonic valve was normal in structure. Pulmonic valve regurgitation is not visualized. No evidence of pulmonic stenosis. Aorta: The aortic root is normal in size and structure. There is mild dilatation of the ascending aorta, measuring 37 mm. Venous: The inferior vena cava is normal in size with greater than 50% respiratory variability, suggesting right atrial pressure of 3 mmHg. IAS/Shunts: No atrial level shunt detected by color flow Doppler.  LEFT VENTRICLE PLAX 2D LVIDd:         4.40 cm      Diastology LVIDs:         2.90 cm      LV e' medial:    4.62 cm/s LV PW:         0.90 cm      LV E/e' medial:  17.1 LV IVS:        0.90 cm      LV e' lateral:   5.51 cm/s LVOT diam:     1.90 cm      LV E/e' lateral: 14.3 LV SV:         49 LV SV Index:   24 LVOT Area:     2.84 cm  LV Volumes (MOD) LV vol d, MOD A2C: 69.8 ml LV vol d, MOD A4C: 112.0 ml LV vol s, MOD A2C: 35.2 ml LV vol  s, MOD A4C: 48.8 ml LV SV MOD A2C:     34.6 ml LV SV MOD A4C:     112.0 ml LV SV MOD BP:      48.0 ml RIGHT VENTRICLE RV S prime:     16.90 cm/s TAPSE (M-mode): 2.0 cm LEFT ATRIUM             Index       RIGHT ATRIUM           Index LA diam:        3.10 cm 1.52 cm/m  RA Area:     12.80 cm LA Vol (A2C):   42.3 ml 20.79 ml/m RA Volume:   34.60 ml  17.01 ml/m LA Vol (A4C):   22.3 ml 10.96 ml/m LA Biplane Vol: 31.9 ml 15.68 ml/m  AORTIC VALVE LVOT Vmax:   77.70 cm/s LVOT Vmean:  53.700 cm/s LVOT VTI:    0.173 m  AORTA Ao Root diam: 2.90 cm Ao Asc diam:  3.70 cm MITRAL VALVE MV Area (PHT): 3.61 cm    SHUNTS MV Decel Time: 210 msec    Systemic VTI:  0.17 m MV E  velocity: 78.80 cm/s  Systemic Diam: 1.90 cm MV A velocity: 74.60 cm/s MV E/A ratio:  1.06 Mihai Croitoru MD Electronically signed by Thurmon FairMihai Croitoru MD Signature Date/Time: 05/01/2020/11:17:38 AM    Final    PHYSICAL EXAM GENERAL: Awake, alert ambulating in room independently in NAD HEENT: - Normocephalic and atraumatic.  LUNGS - Normal respiratory effort.  CV - RRR. BP high.  ABDOMEN - Soft, nontender Ext: warm, well perfused Psych: calm, cooperative.   NEURO:  Mental Status: Alert, oriented x4. Conversing and joking with staff.  Speech/Language: Speech is clear and fluent.  Naming, repetition, fluency, and comprehension intact. Cranial Nerves:  II: PERRL 412mm/brisk. visual fields full.  III, IV, VI: EOMI. VFF V: sensation is intact and symmetrical to face to light touch V1-V3 VII: + left facial droop. Able to puff cheeks and raise eyebrows.  VIII: Hearing intact to voice IX, X: Palate elevation is symmetric. Phonation normal.  XI: normal sternocleidomastoid and trapezius muscle strength XII: Tongue is symmetrical without fasciculations.   Motor: 5/5 strength in the right hemibody. Subtle weakness of the left hemibody, most apparent with left grip weakness 4/5 and other wise 4+/5  Sensation- Intact to light touch bilaterally in upper and lower extremities. No Extinction present to LUE and LLE to light touch when testing DSS.  Coordination: RAMS intact R hand, slowed in the left hand. HKS intact bilaterally.  DTRs: 2+ throughout but slightly less brisk to LUE relative to RUE.  Gait- Deferred  ASSESSMENT/PLAN  60 yo female who presents by EMS after seeing her PCP today for left facial droop that began 5 days ago. She had a short period of dysarthria on the day symptoms were first experienced that quickly resolved. Has had no aphasia. No history of stroke. On examination, her positive findings are left facial droop,  + finger roll test on the left, subtle LUE weakness and left sided  extinction to DSS.    Stroke: Foci of restricted diffusion within the right centrum semiovale and corona radiata, consistent with acute/subacute infarcts in a watershed distribution concerning for cardioembolic source. Also with severe, near occlusive stenosis versus near occlusive thrombus superior  division proximal M2 right MCA branch vessel. May be candidate for IR intervention/stent placement as outpatient next week.    Code Stroke CT head No acute abnormality.  Redemonstrated patchy and ill-defined hypoattenuation within the bilateral cerebral white matter, which is overall moderate in severity but advanced for age. Findings are nonspecific, but most often secondary to chronic small vessel ischemia. Mild bilateral maxillary sinus mucosal thickening.  CTA head & neck  Unchanged short-segment apparent flow gap within a superior  division proximal M2 right MCA branch vessel: severe, near occlusive stenosis versus near occlusive thrombus. Mild stenosis of the distal M1 right middle cerebral artery. Calcified plaque within the intracranial ICAs with mild/moderate stenosis of the paraclinoid segments bilaterally. Mild atherosclerotic narrowing of the V4 right vertebral artery.  MRI : Foci of restricted diffusion within the right centrum semiovale and corona radiata, consistent with acute/subacute infarcts in a watershed distribution.  TCD bubble study: No PFO per Dr. Roda Shutters, final report pending  2D Echo: EF 55-60%, No thrombus, wall motion abnormality or shunt found.   LDL 128  HgbA1c 8.2    Diet   Diet heart healthy/carb modified Room service appropriate? Yes; Fluid consistency: Thin     Therapy recommendations:  Cleared for home   Disposition:  Home with home health PT, planning to DC today  30 day cardiac monitoring request sent to cards master  Plan is for ASA  daily and Brillinta  BID for 30 days then ASA 81 mg alone.    Hypertension  Stable . Permissive  hypertension (OK if < 220/120) but gradually normalize in 5-7 days . Long-term BP goal normotensive . Close PCP follow up to control stroke risk factors   Hyperlipidemia  Home meds: None   LDL 128, goal < 70  High intensity statin   Continue statin at discharge  Diabetes type II Uncontrolled  HgbA1c 8.2, not at goal < 7.0  CBGs Recent Labs    05/01/20 0348 05/01/20 0834 05/01/20 1108  GLUCAP 104* 106* 156*      SSI  Close PCP follow up to improve control   Other Stroke Risk Factors: None  Other Active Problems    Hospital day # 0   This patient was seen with and plan of care was directed by Dr. Everardo Pacific, NP-C  ATTENDING NOTE: I reviewed above note and agree with the assessment and plan. Pt was seen and examined.   60 year old female with history of diabetes, hypertension admitted for left facial droop.  MRI showed right MCA scattered infarcts.  MRA head and neck showed right M2 segmental occlusion versus near occlusion.  CTA head and neck obtained showed right M2 5mm short segment occlusion with near occlusion, bilateral atherosclerosis in the siphon and above, as well as atherosclerosis bilateral V4.  EF 55 to 60%, TCD bubble study negative.  A1c 8.2, LDL 128.  On exam, patient awake alert, orientated x3, no aphasia, follows simple commands, able to name and repeat.  No gaze palsy, field full, left facial droop present.  Tongue midline.  Bilateral upper and lower extremities motor symmetrical except left hand mildly decreased dexterity.  Sensation symmetrical, finger-to-nose intact.  Etiology for patient stroke most likely due to large vessel disease from right M2 occlusion or near occlusion.  Given her risk factors and multifocal intracranial stenosis, right M2 occlusion/near occlusion likely due to atherosclerosis.  However, cardioembolic source cannot be completely ruled out.  Recommend cerebral angiogram with Dr. Corliss Skains early next week with  the intention to treat with stent if needed.  Recommend 30-day cardiac event monitoring as outpatient to rule out A. fib.  Recommend aspirin 81 and Brilinta 90 mg  twice daily for 30 days and then aspirin alone after.  Loaded with Brilinta 180 stat.  Continue Lipitor 80.  BP goal 130-160 before procedure.  Stroke risk factor modification.  Patient would like to go home today and come back next week for cerebral angiogram.  Recommend her to have her son supervise her at home instead of leaving alone.  She expressed understanding.  Neurology will sign off. Please call with questions. Pt will follow up with stroke clinic NP at [facility] in about 4 weeks. Thanks for the consult.   Marvel Plan, MD PhD Stroke Neurology 05/01/2020 4:42 PM    To contact Stroke Continuity provider, please refer to [email or web address]. After hours, contact General Neurology

## 2020-05-01 NOTE — Progress Notes (Signed)
Pt stated that she does not need a walker she can get one from her mother, would prefer a 3n1.

## 2020-05-01 NOTE — Progress Notes (Signed)
PT Cancellation Note  Patient Details Name: Crystal Boyer MRN: [id number] DOB: 11-Apr-1960   Cancelled Treatment:    Reason Eval/Treat Not Completed: Patient declined, no reason specified attempted to work with her for PT eval however she politely declines as lunch had just arrived, politely declines getting up to chair to eat. Will attempt to check back later if time/schedule allow.    Madelaine Etienne, DPT, PN1   Supplemental Physical Therapist [facility]    Pager [phone] Acute Rehab Office [phone]

## 2020-05-01 NOTE — Discharge Summary (Signed)
Name: Crystal Boyer MRN: [id number] DOB: 29-Nov-1960 60 y.o. PCP: [facility]  Date of Admission: 04/30/2020  9:37 AM Date of Discharge: 05/01/20 Attending Physician: Dr. Heide Spark  Discharge Diagnosis: Active Problems:   CVA (cerebral vascular accident) [facility])    Discharge Medications: Allergies as of 05/01/2020      Reactions   Eggs Or Egg-derived Products Diarrhea   Actos [pioglitazone] Other (See Comments)   Made the patient feel like she was "going to die"   Glipizide Other (See Comments)   Made patient feel like she was "going to pass out and die"   Tramadol Rash      Medication List    STOP taking these medications   acetaminophen 500 MG tablet Commonly known as: TYLENOL   amLODipine 5 MG tablet Commonly known as: NORVASC   cetirizine 10 MG tablet Commonly known as: ZyrTEC Allergy   cyclobenzaprine 10 MG tablet Commonly known as: FLEXERIL   diazepam 5 MG tablet Commonly known as: Valium   diclofenac sodium 1 % Gel Commonly known as: VOLTAREN   fluticasone 50 MCG/ACT nasal spray Commonly known as: FLONASE   HYDROcodone-acetaminophen 5-325 MG tablet Commonly known as: NORCO/VICODIN   ibuprofen 200 MG tablet Commonly known as: ADVIL   ibuprofen 800 MG tablet Commonly known as: ADVIL     TAKE these medications   aspirin 81 MG EC tablet Take 1 tablet (81 mg total) by mouth daily. Swallow whole.   atorvastatin 80 MG tablet Commonly known as: LIPITOR Take 1 tablet (80 mg total) by mouth daily.   Lantus SoloStar 100 UNIT/ML Solostar Pen Generic drug: insulin glargine Inject 30 Units into the skin at bedtime. What changed: how much to take   losartan 50 MG tablet Commonly known as: COZAAR Take 1 tablet (50 mg total) by mouth daily.   ticagrelor 90 MG Tabs tablet Commonly known as: BRILINTA Take 1 tablet (90 mg total) by mouth 2 (two) times daily.       Disposition and follow-up:   Ms.Crystal Boyer was discharged from  [facility] in Stable condition.  At the hospital follow up visit please address:  1.  Follow-up:  a.  Acute CVA-follow-up neurology/radiology for cerebral angiogram    b.  Type 2 diabetes-follow-up primary care provider to start Metformin   c.  Hypertension/hyperlipidemia-follow-up primary care provider/neurology  2.  Labs / imaging needed at time of follow-up: Follow-up with interventional radiology  3.  Pending labs/ test needing follow-up: None  4.  Medication Changes  Started: Brilinta, aspirin, Lipitor  Stopped: Amlodipine   Changed: Lantus 30 units  Abx -none    Follow-up Appointments:  Follow-up Information    [facility] Follow up.   Specialty: Internal Medicine Contact information: [address] [place]  [place] [address] [phone]        Julieanne Cotton, MD Follow up.   Specialties: Interventional Radiology, Radiology Why: Dr. Roda Shutters of neurology will make an appointment for you to meet with Dr. Ova Freshwater information: [address] [place] [address] [phone]        [facility] Follow up.   Specialty: Rehabilitation Contact information: [address] [id number] mc [place] [address] [phone]       Parke Poisson, MD Follow up on 06/26/2020.   Specialties: Cardiology, Radiology Why: Please arrive 15 minutes early for your 3:20pm cardiology appointment to review your heart monitor results Contact information: [address]  [address] [place] [address] [phone]        [facility]. Schedule an appointment as soon as possible for a visit in 4 week(s).   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  Patient also endorsed short period of dysarthria but not aphasia.  This quickly dissipated and she had none upon her initial presentation.  She had no weakness or numbness.  Patient symptoms developed 1 week prior to admission and therefore was not a candidate for TPA or thrombectomy.  MRI was performed and showed right sided MCA a CVA watershed infarct.  MRI head neck showed severe M2 segment stenosis intracranially as well as possible stenosis of the origin of the right carotid artery.  CTA head/neck showed no evidence of severe stenosis in her external carotid arteries.  Imaging was read by neurology who thought patient had foci of restricted diffusion within the right centrum semiovale and corona radiata consistent with acute/subacute infarcts in watershed distribution concerning for cardioembolic source.  Patient had TCD bubble study, she was without PFO.  She had an echocardiogram with an EF of 55 to 60% with no thrombus wall motion abnormality or shunt.  LDL was 128 A1c of 8.2.  Patient was started on a statin was instructed to follow-up with primary care provider for diabetes management.  She also need blood pressure goals and to follow-up with primary care provider for these as well.  She was started on aspirin and Brilinta, she would continue this combination for 30 days and then just be on aspirin 81 mg.  She was to follow-up with cardiology for cardiac monitor and radiology for cerebral angiogram.  Patient was discharged home to her son to supervise her and expressed understanding that patient will need to return if symptoms acutely worsen.   Pertinent Labs, Studies, and Procedures:  CBC Latest Ref Rng & Units 05/01/2020 04/30/2020 04/20/2020  WBC 4.0 - 10.5 K/uL 5.1 5.6 6.0  Hemoglobin 12.0 - 15.0 g/dL 46.2 86.3 81.7  Hematocrit 36.0 - 46.0 % 37.8 41.0 41.0  Platelets 150 - 400 K/uL 235 239 240    CMP Latest Ref Rng & Units  05/01/2020 04/30/2020 04/20/2020  Glucose 70 - 99 mg/dL 711(A) 579(U) 383(F)  BUN 6 - 20 mg/dL 10 11 16   Creatinine 0.44 - 1.00 mg/dL 3.83) 2.91(B  Sodium 135 - 145 mmol/L 140 139 139  Potassium 3.5 - 5.1 mmol/L 3.4(L) 4.3 3.5  Chloride 98 - 111 mmol/L 106 104 105  CO2 22 - 32 mmol/L 28 26 28   Calcium 8.9 - 10.3 mg/dL 9.2 9.4 9.3  Total Protein 6.5 - 8.1 g/dL 6.6 7.4 7.5  Total Bilirubin 0.3 - 1.2 mg/dL 0.4 0.7 1.0  Alkaline Phos 38 - 126 U/L 80 96 95  AST 15 - 41 U/L 22 23 21   ALT 0 - 44 U/L 19 21 22     CT ANGIO HEAD W OR WO CONTRAST  Result Date: 05/01/2020 CLINICAL DATA:  Neuro deficit, acute, stroke suspected. Follow-up stroke. EXAM: CT ANGIOGRAPHY HEAD AND NECK TECHNIQUE: Multidetector CT imaging of the head and neck was performed using the standard protocol during bolus administration of intravenous contrast. Multiplanar CT image reconstructions and MIPs were  obtained to evaluate the vascular anatomy. Carotid stenosis measurements (when applicable) are obtained utilizing NASCET criteria, using the distal internal carotid diameter as the denominator. CONTRAST:  54mL OMNIPAQUE IOHEXOL 350 MG/ML SOLN COMPARISON:  MRI brain 04/30/2020.  MRA head/neck 05/01/2020. FINDINGS: CTA NECK FINDINGS Aortic arch: Standard aortic branching. Atherosclerotic plaque within the visualized aortic arch and proximal major branch vessels of the neck. No hemodynamically significant innominate or proximal subclavian artery stenosis. Right carotid system: CCA and ICA patent within the neck without significant stenosis (50% or greater). Mild soft and calcified plaque within the carotid bifurcation and proximal ICA. Left carotid system: CCA and ICA patent within the neck without significant stenosis (50% or greater). Mild soft and calcified plaque within the carotid bifurcation and proximal ICA. Vertebral arteries: Codominant patent within the neck without stenosis. Skeleton: Cervical spondylosis. No acute bony  abnormality or aggressive osseous lesion. Partially imaged thoracic dextrocurvature Other neck: No neck mass or cervical lymphadenopathy. Thyroid unremarkable. Upper chest: No consolidation within the imaged lung apices. Review of the MIP images confirms the above findings CTA HEAD FINDINGS Anterior circulation: The intracranial internal carotid arteries are patent. Calcified plaque within both vessels. Mild to moderate stenosis of the paraclinoid segments bilaterally. Asymmetric M1 MCA branching. Mild atherosclerotic narrowing of the distal M1 right MCA. Redemonstrated short-segment apparent flow gap within a superior division proximal M2 right MCA branch vessel compatible with severe, near occlusive stenosis or near occlusive thrombus (series 11, image 21) (series 13, image 17). The left M1 middle cerebral artery is patent. No left M2 proximal branch occlusion or high-grade proximal stenosis. The anterior cerebral arteries are patent. No intracranial aneurysm is identified. Posterior circulation: The intracranial vertebral arteries are patent. Calcified plaque results in mild stenosis of the V4 right vertebral artery. Mild nonstenotic calcified plaque within the V4 left vertebral artery. The basilar artery is patent. The posterior cerebral arteries are patent. Mild atherosclerotic narrowing within the right PCA at the P1/P2 junction. Posterior communicating arteries are hypoplastic or absent bilaterally. Venous sinuses: Within the limitations of contrast timing, no convincing thrombus. Anatomic variants: As described Review of the MIP images confirms the above findings IMPRESSION: CTA neck: 1. The common carotid and internal carotid arteries are patent within the neck without hemodynamically significant stenosis. Mild atherosclerotic plaque within the carotid bifurcations and proximal ICAs bilaterally. 2. Vertebral arteries patent within the neck without stenosis. CTA head: 1. Unchanged short-segment apparent flow  gap within a superior division proximal M2 right MCA branch vessel. Findings are compatible with severe, near occlusive stenosis versus near occlusive thrombus. 2. Mild stenosis of the distal M1 right middle cerebral artery. 3. Calcified plaque within the intracranial ICAs with mild/moderate stenosis of the paraclinoid segments bilaterally. 4. Mild atherosclerotic narrowing of the V4 right vertebral artery. Electronically Signed   By: Jackey Loge DO   On: 05/01/2020 12:24   CT Head Wo Contrast  Result Date: 04/30/2020 CLINICAL DATA:  Neuro deficit, acute, stroke suspected. Altered mental status. EXAM: CT HEAD WITHOUT CONTRAST TECHNIQUE: Contiguous axial images were obtained from the base of the skull through the vertex without intravenous contrast. COMPARISON:  Head CT 04/20/2020. FINDINGS: Brain: Cerebral volume is normal. Patchy and ill-defined hypoattenuation within the bilateral cerebral white matter is overall moderate, but advanced for age. Findings are nonspecific but most often secondary to chronic small vessel ischemia. There is no acute intracranial hemorrhage. No demarcated cortical infarct. No extra-axial fluid collection. No evidence of intracranial mass. No midline shift. Vascular: No hyperdense vessel.  Atherosclerotic calcifications Skull: No calvarial fracture or aggressive appearing osseous lesion Sinuses/Orbits: Visualized orbits show no acute finding. Trace bilateral maxillary sinus mucosal thickening. IMPRESSION: No CT evidence of acute intracranial abnormality. Redemonstrated patchy and ill-defined hypoattenuation within the bilateral cerebral white matter, which is overall moderate in severity but advanced for age. Findings are nonspecific, but most often secondary to chronic small vessel ischemia. Mild bilateral maxillary sinus mucosal thickening. Electronically Signed   By: Jackey Loge DO   On: 04/30/2020 10:59   CT ANGIO NECK W OR WO CONTRAST  Result Date: 05/01/2020 CLINICAL  DATA:  Neuro deficit, acute, stroke suspected. Follow-up stroke. EXAM: CT ANGIOGRAPHY HEAD AND NECK TECHNIQUE: Multidetector CT imaging of the head and neck was performed using the standard protocol during bolus administration of intravenous contrast. Multiplanar CT image reconstructions and MIPs were obtained to evaluate the vascular anatomy. Carotid stenosis measurements (when applicable) are obtained utilizing NASCET criteria, using the distal internal carotid diameter as the denominator. CONTRAST:  75mL OMNIPAQUE IOHEXOL 350 MG/ML SOLN COMPARISON:  MRI brain 04/30/2020.  MRA head/neck 05/01/2020. FINDINGS: CTA NECK FINDINGS Aortic arch: Standard aortic branching. Atherosclerotic plaque within the visualized aortic arch and proximal major branch vessels of the neck. No hemodynamically significant innominate or proximal subclavian artery stenosis. Right carotid system: CCA and ICA patent within the neck without significant stenosis (50% or greater). Mild soft and calcified plaque within the carotid bifurcation and proximal ICA. Left carotid system: CCA and ICA patent within the neck without significant stenosis (50% or greater). Mild soft and calcified plaque within the carotid bifurcation and proximal ICA. Vertebral arteries: Codominant patent within the neck without stenosis. Skeleton: Cervical spondylosis. No acute bony abnormality or aggressive osseous lesion. Partially imaged thoracic dextrocurvature Other neck: No neck mass or cervical lymphadenopathy. Thyroid unremarkable. Upper chest: No consolidation within the imaged lung apices. Review of the MIP images confirms the above findings CTA HEAD FINDINGS Anterior circulation: The intracranial internal carotid arteries are patent. Calcified plaque within both vessels. Mild to moderate stenosis of the paraclinoid segments bilaterally. Asymmetric M1 MCA branching. Mild atherosclerotic narrowing of the distal M1 right MCA. Redemonstrated short-segment apparent  flow gap within a superior division proximal M2 right MCA branch vessel compatible with severe, near occlusive stenosis or near occlusive thrombus (series 11, image 21) (series 13, image 17). The left M1 middle cerebral artery is patent. No left M2 proximal branch occlusion or high-grade proximal stenosis. The anterior cerebral arteries are patent. No intracranial aneurysm is identified. Posterior circulation: The intracranial vertebral arteries are patent. Calcified plaque results in mild stenosis of the V4 right vertebral artery. Mild nonstenotic calcified plaque within the V4 left vertebral artery. The basilar artery is patent. The posterior cerebral arteries are patent. Mild atherosclerotic narrowing within the right PCA at the P1/P2 junction. Posterior communicating arteries are hypoplastic or absent bilaterally. Venous sinuses: Within the limitations of contrast timing, no convincing thrombus. Anatomic variants: As described Review of the MIP images confirms the above findings IMPRESSION: CTA neck: 1. The common carotid and internal carotid arteries are patent within the neck without hemodynamically significant stenosis. Mild atherosclerotic plaque within the carotid bifurcations and proximal ICAs bilaterally. 2. Vertebral arteries patent within the neck without stenosis. CTA head: 1. Unchanged short-segment apparent flow gap within a superior division proximal M2 right MCA branch vessel. Findings are compatible with severe, near occlusive stenosis versus near occlusive thrombus. 2. Mild stenosis of the distal M1 right middle cerebral artery. 3. Calcified plaque within the intracranial ICAs  with mild/moderate stenosis of the paraclinoid segments bilaterally. 4. Mild atherosclerotic narrowing of the V4 right vertebral artery. Electronically Signed   By: Jackey Loge DO   On: 05/01/2020 12:24   MR ANGIO HEAD WO CONTRAST  Result Date: 05/01/2020 CLINICAL DATA:  Initial evaluation for follow-up examination for  acute stroke. EXAM: MRA NECK WITHOUT AND WITH CONTRAST MRA HEAD WITHOUT CONTRAST TECHNIQUE: Multiplanar and multiecho pulse sequences of the neck were obtained without and with intravenous contrast. Angiographic images of the neck were obtained using MRA technique without and with intravenous contrast; Angiographic images of the Circle of Willis were obtained using MRA technique without intravenous contrast. CONTRAST:  9mL GADAVIST GADOBUTROL 1 MMOL/ML IV SOLN COMPARISON:  Prior brain MRI from 04/30/2020 FINDINGS: MRA NECK FINDINGS AORTIC ARCH: Examination technically limited by timing the contrast bolus, with the majority of the contrast in the venous system. Visualized aortic arch normal in caliber with normal 3 vessel morphology. No hemodynamically significant stenosis seen about the origin of the great vessels. Visualized subclavian arteries patent without stenosis. RIGHT CAROTID SYSTEM: Right CCA patent from its origin to the bifurcation without stenosis. Atheromatous irregularity about the right carotid bulb/proximal right ICA without hemodynamically significant stenosis. Right ICA patent distally to the skull base without stenosis, evidence for dissection or occlusion. There is apparent severe stenosis versus occlusion at the origin of the right external carotid artery (series 1502, image 57). LEFT CAROTID SYSTEM: Left CCA patent from its origin to the bifurcation without stenosis. Atheromatous irregularity about the left carotid bulb/proximal left ICA with associated stenosis of up to 50% by NASCET criteria. Left ICA patent distally without stenosis, evidence for dissection or occlusion. Additional moderate approximate 50% stenosis noted at the origin of the left external carotid artery as well. VERTEBRAL ARTERIES: Both vertebral arteries arise from the subclavian arteries. No proximal subclavian artery stenosis. Vertebral arteries somewhat tortuous but appear to be widely patent within the neck without  stenosis, evidence for dissection or occlusion. MRA HEAD FINDINGS ANTERIOR CIRCULATION: Examination degraded by motion artifact. Both internal carotid arteries patent to the termini without significant stenosis or other abnormality. A1 segments patent. Normal anterior communicating artery complex. Anterior cerebral arteries patent to their distal aspects without stenosis. No M1 stenosis or occlusion. Normal MCA bifurcations. On the right, there is either short-segment severe stenosis versus subocclusive thrombus involving a proximal right M2 branch, superior division (series 12, images 141-144) vessel is perfused distally. Remainder of the right MCA branches perfused. Contralateral left MCA branches widely patent and well perfused. POSTERIOR CIRCULATION: Both V4 segments patent to the vertebrobasilar junction without stenosis. Right PICA origin faintly visible and patent. Basilar patent to its distal aspect without stenosis. Superior cerebellar arteries patent bilaterally. Both PCAs primarily supplied via the basilar well perfused or distal aspects. No intracranial aneurysm. IMPRESSION: MRA HEAD IMPRESSION: 1. Short-segment severe stenosis versus subocclusive thrombus involving a proximal right M2 branch, superior division. 2. Otherwise wide patency of the intracranial arterial vasculature. No other hemodynamically significant or correctable stenosis. No aneurysm. MRA NECK IMPRESSION: 1. Atheromatous irregularity about the carotid bifurcations with associated stenosis of up to 50% on the left. No hemodynamically significant stenosis on the right. 2. Severe stenosis versus occlusion at the origin of the right external carotid artery. Additional approximate 50% stenosis at the origin of the left external carotid artery. 3. Wide patency of both vertebral arteries within the neck. Electronically Signed   By: Rise Mu M.D.   On: 05/01/2020 02:35   MR ANGIO  NECK W WO CONTRAST  Result Date:  05/01/2020 CLINICAL DATA:  Initial evaluation for follow-up examination for acute stroke. EXAM: MRA NECK WITHOUT AND WITH CONTRAST MRA HEAD WITHOUT CONTRAST TECHNIQUE: Multiplanar and multiecho pulse sequences of the neck were obtained without and with intravenous contrast. Angiographic images of the neck were obtained using MRA technique without and with intravenous contrast; Angiographic images of the Circle of Willis were obtained using MRA technique without intravenous contrast. CONTRAST:  9mL GADAVIST GADOBUTROL 1 MMOL/ML IV SOLN COMPARISON:  Prior brain MRI from 04/30/2020 FINDINGS: MRA NECK FINDINGS AORTIC ARCH: Examination technically limited by timing the contrast bolus, with the majority of the contrast in the venous system. Visualized aortic arch normal in caliber with normal 3 vessel morphology. No hemodynamically significant stenosis seen about the origin of the great vessels. Visualized subclavian arteries patent without stenosis. RIGHT CAROTID SYSTEM: Right CCA patent from its origin to the bifurcation without stenosis. Atheromatous irregularity about the right carotid bulb/proximal right ICA without hemodynamically significant stenosis. Right ICA patent distally to the skull base without stenosis, evidence for dissection or occlusion. There is apparent severe stenosis versus occlusion at the origin of the right external carotid artery (series 1502, image 57). LEFT CAROTID SYSTEM: Left CCA patent from its origin to the bifurcation without stenosis. Atheromatous irregularity about the left carotid bulb/proximal left ICA with associated stenosis of up to 50% by NASCET criteria. Left ICA patent distally without stenosis, evidence for dissection or occlusion. Additional moderate approximate 50% stenosis noted at the origin of the left external carotid artery as well. VERTEBRAL ARTERIES: Both vertebral arteries arise from the subclavian arteries. No proximal subclavian artery stenosis. Vertebral arteries  somewhat tortuous but appear to be widely patent within the neck without stenosis, evidence for dissection or occlusion. MRA HEAD FINDINGS ANTERIOR CIRCULATION: Examination degraded by motion artifact. Both internal carotid arteries patent to the termini without significant stenosis or other abnormality. A1 segments patent. Normal anterior communicating artery complex. Anterior cerebral arteries patent to their distal aspects without stenosis. No M1 stenosis or occlusion. Normal MCA bifurcations. On the right, there is either short-segment severe stenosis versus subocclusive thrombus involving a proximal right M2 branch, superior division (series 12, images 141-144) vessel is perfused distally. Remainder of the right MCA branches perfused. Contralateral left MCA branches widely patent and well perfused. POSTERIOR CIRCULATION: Both V4 segments patent to the vertebrobasilar junction without stenosis. Right PICA origin faintly visible and patent. Basilar patent to its distal aspect without stenosis. Superior cerebellar arteries patent bilaterally. Both PCAs primarily supplied via the basilar well perfused or distal aspects. No intracranial aneurysm. IMPRESSION: MRA HEAD IMPRESSION: 1. Short-segment severe stenosis versus subocclusive thrombus involving a proximal right M2 branch, superior division. 2. Otherwise wide patency of the intracranial arterial vasculature. No other hemodynamically significant or correctable stenosis. No aneurysm. MRA NECK IMPRESSION: 1. Atheromatous irregularity about the carotid bifurcations with associated stenosis of up to 50% on the left. No hemodynamically significant stenosis on the right. 2. Severe stenosis versus occlusion at the origin of the right external carotid artery. Additional approximate 50% stenosis at the origin of the left external carotid artery. 3. Wide patency of both vertebral arteries within the neck. Electronically Signed   By: Rise Mu M.D.   On:  05/01/2020 02:35   MR BRAIN WO CONTRAST  Result Date: 04/30/2020 CLINICAL DATA:  Neuro deficit, acute, stroke suspected. EXAM: MRI HEAD WITHOUT CONTRAST TECHNIQUE: Multiplanar, multiecho pulse sequences of the brain and surrounding structures were obtained without intravenous  contrast. COMPARISON:  Head CT April 30, 2020. FINDINGS: Brain: Foci of restricted diffusion are seen within the right centrum semiovale and corona radiata, consistent with acute/subacute infarcts. No hemorrhage, hydrocephalus, extra-axial collection or mass lesion. Scattered and confluent foci of T2 hyperintensity are seen within the white matter of the cerebral hemispheres, nonspecific, most likely related to chronic microangiopathy. Prominent diffuse perivascular spaces throughout the cerebral hemispheres. Vascular: Normal flow voids. Skull and upper cervical spine: Normal marrow signal. Sinuses/Orbits: Bilateral proptosis.  Paranasal sinuses are clear. IMPRESSION: 1. Foci of restricted diffusion within the right centrum semiovale and corona radiata, consistent with acute/subacute infarcts in a watershed distribution. 2. Moderate chronic microangiopathy. Findings communicated to Dr. Wilford Corner via secure text paging on 04/30/2020 at 7:03 p.m. Electronically Signed   By: Baldemar Lenis M.D.   On: 04/30/2020 19:03   ECHOCARDIOGRAM COMPLETE  Result Date: 05/01/2020    ECHOCARDIOGRAM REPORT   Patient Name:   Crystal Boyer Date of Exam: 05/01/2020 Medical Rec #:  [id number]           Height:       66.0 in Accession #:    [id number]          Weight:       208.3 lb Date of Birth:  1960/07/04           BSA:          2.035 m Patient Age:    59 years            BP:           168/91 mmHg Patient Gender: F                   HR:           75 bpm. Exam Location:  Inpatient Procedure: 2D Echo, Cardiac Doppler and Color Doppler Indications:    Stroke I63.9  History:        Patient has no prior history of Echocardiogram examinations.                  Risk Factors:Hypertension, Diabetes and Non-Smoker.  Sonographer:    Renella Cunas RDCS Referring Phys: [id number] [facility]  Sonographer Comments: Image acquisition challenging due to respiratory motion. IMPRESSIONS  1. Left ventricular ejection fraction, by estimation, is 55 to 60%. The left ventricle has normal function. The left ventricle has no regional wall motion abnormalities. Left ventricular diastolic parameters are indeterminate.  2. Right ventricular systolic function is normal. The right ventricular size is normal. Tricuspid regurgitation signal is inadequate for assessing PA pressure.  3. The mitral valve is normal in structure. No evidence of mitral valve regurgitation. No evidence of mitral stenosis.  4. The aortic valve is normal in structure. Aortic valve regurgitation is not visualized. Mild aortic valve sclerosis is present, with no evidence of aortic valve stenosis.  5. There is mild dilatation of the ascending aorta, measuring 37 mm.  6. The inferior vena cava is normal in size with greater than 50% respiratory variability, suggesting right atrial pressure of 3 mmHg. FINDINGS  Left Ventricle: Left ventricular ejection fraction, by estimation, is 55 to 60%. The left ventricle has normal function. The left ventricle has no regional wall motion abnormalities. The left ventricular internal cavity size was normal in size. There is  no left ventricular hypertrophy. Left ventricular diastolic parameters are indeterminate. Right Ventricle: The right ventricular size is normal. No increase in right ventricular wall thickness. Right  ventricular systolic function is normal. Tricuspid regurgitation signal is inadequate for assessing PA pressure. Left Atrium: Left atrial size was normal in size. Right Atrium: Right atrial size was normal in size. Pericardium: There is no evidence of pericardial effusion. Mitral Valve: The mitral valve is normal in structure. No evidence of mitral valve  regurgitation. No evidence of mitral valve stenosis. Tricuspid Valve: The tricuspid valve is normal in structure. Tricuspid valve regurgitation is not demonstrated. No evidence of tricuspid stenosis. Aortic Valve: The aortic valve is normal in structure. Aortic valve regurgitation is not visualized. Mild aortic valve sclerosis is present, with no evidence of aortic valve stenosis. Pulmonic Valve: The pulmonic valve was normal in structure. Pulmonic valve regurgitation is not visualized. No evidence of pulmonic stenosis. Aorta: The aortic root is normal in size and structure. There is mild dilatation of the ascending aorta, measuring 37 mm. Venous: The inferior vena cava is normal in size with greater than 50% respiratory variability, suggesting right atrial pressure of 3 mmHg. IAS/Shunts: No atrial level shunt detected by color flow Doppler.  LEFT VENTRICLE PLAX 2D LVIDd:         4.40 cm      Diastology LVIDs:         2.90 cm      LV e' medial:    4.62 cm/s LV PW:         0.90 cm      LV E/e' medial:  17.1 LV IVS:        0.90 cm      LV e' lateral:   5.51 cm/s LVOT diam:     1.90 cm      LV E/e' lateral: 14.3 LV SV:         49 LV SV Index:   24 LVOT Area:     2.84 cm  LV Volumes (MOD) LV vol d, MOD A2C: 69.8 ml LV vol d, MOD A4C: 112.0 ml LV vol s, MOD A2C: 35.2 ml LV vol s, MOD A4C: 48.8 ml LV SV MOD A2C:     34.6 ml LV SV MOD A4C:     112.0 ml LV SV MOD BP:      48.0 ml RIGHT VENTRICLE RV S prime:     16.90 cm/s TAPSE (M-mode): 2.0 cm LEFT ATRIUM             Index       RIGHT ATRIUM           Index LA diam:        3.10 cm 1.52 cm/m  RA Area:     12.80 cm LA Vol (A2C):   42.3 ml 20.79 ml/m RA Volume:   34.60 ml  17.01 ml/m LA Vol (A4C):   22.3 ml 10.96 ml/m LA Biplane Vol: 31.9 ml 15.68 ml/m  AORTIC VALVE LVOT Vmax:   77.70 cm/s LVOT Vmean:  53.700 cm/s LVOT VTI:    0.173 m  AORTA Ao Root diam: 2.90 cm Ao Asc diam:  3.70 cm MITRAL VALVE MV Area (PHT): 3.61 cm    SHUNTS MV Decel Time: 210 msec    Systemic  VTI:  0.17 m MV E velocity: 78.80 cm/s  Systemic Diam: 1.90 cm MV A velocity: 74.60 cm/s MV E/A ratio:  1.06 Mihai Croitoru MD Electronically signed by Thurmon Fair MD Signature Date/Time: 05/01/2020/11:17:38 AM    Final      Discharge Instructions: Discharge Instructions    Ambulatory referral to Neurology   Complete by:  As directed    Follow up with stroke clinic NP (Jessica PindallVanschaick or Darrol Angelarolyn Martin, if both not available, consider Manson AllanSethi, Penumali, or Ahern) [facility] in about 4 weeks. Thanks.   Ambulatory referral to Physical Therapy   Complete by: As directed    Iontophoresis - 4 mg/ml of dexamethasone: No   T.E.N.S. Unit Evaluation and Dispense as Indicated: No   Diet - low sodium heart healthy   Complete by: As directed    Discharge instructions   Complete by: As directed    FOLLOW-UP INSTRUCTIONS:  Thank you for allowing us to be part of your care. [facility]  Please follow up with the following providers: A. Group, [facility], [address] / [place] [id number], [phone] - in 1 week  B. Interventional Radiology, Dr. Corliss Skainseveshwar - in 1 week  Please note these changes made to your medications:   A. Medications to continue: losartan (COZAAR) 50 MG tablet, Take 1 tablet (50 mg total) by mouth daily.  B. Medications to start: LANTUS SOLOSTAR 100 UNIT/ML Solostar Pen, Inject 30 Units into the skin at bedtime. (This is a change form your previous dose) Asprin 81 mg daily Brilinta 90 mg twice daily (1 90 mg tablet in the morning and night) for 30 days then stop this medicaiton.  Atorvastatin 80 mg daily  C. Medications to discontinue:  LANTUS 50 units (we decreased your dose to 30 units at bedtime.) Amlodipine 5 mg (please follow up with your PCP prior to restarting this medication)   Please make sure to take all of your medications as prescribed. Please call us if there is any questions. Please see your follow up sections for  your up coming doctors appointments. Please call your PCP to set up an appointment in 1 week.   Please call our clinic if you have any questions or concerns, we may be able to help and keep you from a long and expensive emergency room wait. Our clinic and after hours phone number is [phone], the best time to call is Monday through Friday 9 am to 4 pm but there is always someone available 24/7 if you have an emergency. If you need medication refills please notify your pharmacy one week in advance and they will send us a request.   Increase activity slowly   Complete by: As directed       Signed: Belva AgeeKatsadouros, Tannar Broker, MD 05/05/2020, 5:00 PM   Pager: [phone]

## 2020-05-19 ENCOUNTER — Emergency Department (HOSPITAL_COMMUNITY)
Admission: EM | Admit: 2020-05-19 | Discharge: 2020-05-19 | Disposition: A | Discharge status: Home / Self Care | Payer: Medicaid Other | Attending: Emergency Medicine | Admitting: Emergency Medicine

## 2020-05-19 ENCOUNTER — Emergency Department (HOSPITAL_COMMUNITY): Payer: Medicaid Other

## 2020-05-19 ENCOUNTER — Encounter (HOSPITAL_COMMUNITY): Payer: Self-pay

## 2020-05-19 ENCOUNTER — Other Ambulatory Visit: Payer: Self-pay

## 2020-05-19 DIAGNOSIS — Z79899 Other long term (current) drug therapy: Secondary | ICD-10-CM | POA: Diagnosis not present

## 2020-05-19 DIAGNOSIS — E119 Type 2 diabetes mellitus without complications: Secondary | ICD-10-CM | POA: Insufficient documentation

## 2020-05-19 DIAGNOSIS — H539 Unspecified visual disturbance: Secondary | ICD-10-CM | POA: Diagnosis not present

## 2020-05-19 DIAGNOSIS — R0602 Shortness of breath: Secondary | ICD-10-CM | POA: Insufficient documentation

## 2020-05-19 DIAGNOSIS — T50905A Adverse effect of unspecified drugs, medicaments and biological substances, initial encounter: Secondary | ICD-10-CM

## 2020-05-19 DIAGNOSIS — J45909 Unspecified asthma, uncomplicated: Secondary | ICD-10-CM | POA: Diagnosis not present

## 2020-05-19 DIAGNOSIS — T7840XA Allergy, unspecified, initial encounter: Secondary | ICD-10-CM | POA: Insufficient documentation

## 2020-05-19 DIAGNOSIS — T887XXA Unspecified adverse effect of drug or medicament, initial encounter: Secondary | ICD-10-CM | POA: Diagnosis not present

## 2020-05-19 DIAGNOSIS — Z794 Long term (current) use of insulin: Secondary | ICD-10-CM | POA: Diagnosis not present

## 2020-05-19 DIAGNOSIS — Z7982 Long term (current) use of aspirin: Secondary | ICD-10-CM | POA: Diagnosis not present

## 2020-05-19 DIAGNOSIS — I10 Essential (primary) hypertension: Secondary | ICD-10-CM | POA: Insufficient documentation

## 2020-05-19 DIAGNOSIS — R110 Nausea: Secondary | ICD-10-CM | POA: Insufficient documentation

## 2020-05-19 DIAGNOSIS — R42 Dizziness and giddiness: Secondary | ICD-10-CM | POA: Insufficient documentation

## 2020-05-19 LAB — CBC WITH DIFFERENTIAL/PLATELET
Abs Immature Granulocytes: 0.03 10*3/uL (ref 0.00–0.07)
Basophils Absolute: 0 10*3/uL (ref 0.0–0.1)
Basophils Relative: 0 %
Eosinophils Absolute: 0.3 10*3/uL (ref 0.0–0.5)
Eosinophils Relative: 3 %
HCT: 38.6 % (ref 36.0–46.0)
Hemoglobin: 12.5 g/dL (ref 12.0–15.0)
Immature Granulocytes: 0 %
Lymphocytes Relative: 23 %
Lymphs Abs: 2.1 10*3/uL (ref 0.7–4.0)
MCH: 28.2 pg (ref 26.0–34.0)
MCHC: 32.4 g/dL (ref 30.0–36.0)
MCV: 86.9 fL (ref 80.0–100.0)
Monocytes Absolute: 0.5 10*3/uL (ref 0.1–1.0)
Monocytes Relative: 5 %
Neutro Abs: 6.1 10*3/uL (ref 1.7–7.7)
Neutrophils Relative %: 69 %
Platelets: 226 10*3/uL (ref 150–400)
RBC: 4.44 MIL/uL (ref 3.87–5.11)
RDW: 13.1 % (ref 11.5–15.5)
WBC: 9 10*3/uL (ref 4.0–10.5)
nRBC: 0 % (ref 0.0–0.2)

## 2020-05-19 LAB — COMPREHENSIVE METABOLIC PANEL
ALT: 29 U/L (ref 0–44)
AST: 28 U/L (ref 15–41)
Albumin: 3.1 g/dL — ABNORMAL LOW (ref 3.5–5.0)
Alkaline Phosphatase: 110 U/L (ref 38–126)
Anion gap: 5 (ref 5–15)
BUN: 18 mg/dL (ref 6–20)
CO2: 29 mmol/L (ref 22–32)
Calcium: 9.3 mg/dL (ref 8.9–10.3)
Chloride: 104 mmol/L (ref 98–111)
Creatinine, Ser: 1.05 mg/dL — ABNORMAL HIGH (ref 0.44–1.00)
GFR, Estimated: >60 mL/min (ref >60)
Glucose, Bld: 129 mg/dL — ABNORMAL HIGH (ref 70–99)
Potassium: 4.1 mmol/L (ref 3.5–5.1)
Sodium: 138 mmol/L (ref 135–145)
Total Bilirubin: 0.8 mg/dL (ref 0.3–1.2)
Total Protein: 6.8 g/dL (ref 6.5–8.1)

## 2020-05-19 LAB — TROPONIN I (HIGH SENSITIVITY)
Troponin I (High Sensitivity): 5 ng/L (ref <18)
Troponin I (High Sensitivity): 4 ng/L (ref <18)

## 2020-05-19 MED ORDER — CLOPIDOGREL BISULFATE 75 MG PO TABS: 75.0000 mg | ORAL_TABLET | Freq: Every day | ORAL | 0 refills | Status: DC

## 2020-05-19 MED ORDER — LORAZEPAM 2 MG/ML IJ SOLN
0.5000 mg | Freq: Once | INTRAMUSCULAR | Status: AC | PRN
  Administered 2020-05-19: 0.5 mg via INTRAVENOUS
  Filled 2020-05-19: qty 1

## 2020-05-19 MED ORDER — CLOPIDOGREL BISULFATE 300 MG PO TABS
300.0000 mg | ORAL_TABLET | Freq: Once | ORAL | Status: AC
  Administered 2020-05-19: 300 mg via ORAL
  Filled 2020-05-19: qty 1

## 2020-05-19 NOTE — ED Notes (Addendum)
Patient transported to MRI with transport svc

## 2020-05-19 NOTE — ED Triage Notes (Signed)
Pt reports she started taking brilinta and feels she is having an allergic reaction . Pt went to her PCP and they told her to come here for observation .  Pt reports she had the shaky last night.

## 2020-05-19 NOTE — ED Notes (Signed)
Unable to obtain vitals, pt in MRI at this time.

## 2020-05-19 NOTE — ED Notes (Signed)
States she started feeling sick last night after she took rx  brilinta
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
## 2020-05-19 NOTE — ED Notes (Signed)
ED Provider at bedside. 

## 2020-05-28 ENCOUNTER — Inpatient Hospital Stay: Payer: Medicaid Other | Admitting: Adult Health

## 2020-06-03 ENCOUNTER — Encounter: Payer: Self-pay | Admitting: Adult Health

## 2020-06-03 ENCOUNTER — Other Ambulatory Visit: Payer: Self-pay

## 2020-06-03 ENCOUNTER — Ambulatory Visit: Payer: Medicaid Other | Admitting: Adult Health

## 2020-06-03 VITALS — BP 178/94 | HR 67 | Ht 66.0 in | Wt 214.0 lb

## 2020-06-03 DIAGNOSIS — I10 Essential (primary) hypertension: Secondary | ICD-10-CM | POA: Diagnosis not present

## 2020-06-03 DIAGNOSIS — I6521 Occlusion and stenosis of right carotid artery: Secondary | ICD-10-CM

## 2020-06-03 DIAGNOSIS — I639 Cerebral infarction, unspecified: Secondary | ICD-10-CM

## 2020-06-03 MED ORDER — AMLODIPINE BESYLATE 2.5 MG PO TABS: 2.5000 mg | ORAL_TABLET | Freq: Every day | ORAL | 4 refills | Status: DC

## 2020-06-03 MED ORDER — CLOPIDOGREL BISULFATE 75 MG PO TABS: 75.0000 mg | ORAL_TABLET | Freq: Every day | ORAL | 0 refills | Status: DC

## 2020-06-03 NOTE — Patient Instructions (Addendum)
Continue aspirin 81 mg daily and clopidogrel 75 mg daily  and atorvastatin 80mg  daily  for secondary stroke prevention  Start amlodepine 2.5mg  daily for elevated blood pressure - please ensure you monitor at home  Contact cardiac monitor place to see where the monitor was sent   Referral sent to Dr. for follow up regarding your artery narrowing in your brain  Continue to follow up with PCP regarding cholesterol, blood pressure and diabetes management  Maintain strict control of hypertension with blood pressure goal below 130/90, diabetes with hemoglobin A1c goal below 7% and cholesterol with LDL cholesterol (bad cholesterol) goal below 70 mg/dL.      Followup in the future with me in 3 months or call earlier if needed     Thank you for coming to see Crystal Boyer at [facility]. I hope we have been able to provide you high quality care today.  You may receive a patient satisfaction survey over the next few weeks. We would appreciate your feedback and comments so that we may continue to improve ourselves and the health of our patients.    Stroke Prevention Some medical conditions and lifestyle choices can lead to a higher risk for a stroke. You can help to prevent a stroke by eating healthy foods and exercising. It also helps to not smoke and to manage any health problems you may have. How can this condition affect me? A stroke is an emergency. It should be treated right away. A stroke can lead to brain damage or threaten your life. There is a better chance of surviving and getting better after a stroke if you get medical help right away. What can increase my risk? The following medical conditions may increase your risk of a stroke:  Diseases of the heart and blood vessels (cardiovascular disease).  High blood pressure (hypertension).  Diabetes.  High cholesterol.  Sickle cell disease.  Problems with blood clotting.  Being very overweight.  Sleeping  problems (obstructivesleep apnea). Other risk factors include:  Being older than age 55.  A history of blood clots, stroke, or mini-stroke (TIA).  Race, ethnic background, or a family history of stroke.  Smoking or using tobacco products.  Taking birth control pills, especially if you smoke.  Heavy alcohol and drug use.  Not being active. What actions can I take to prevent this? Manage your health conditions  High cholesterol. ? Eat a healthy diet. If this is not enough to manage your cholesterol, you may need to take medicines. ? Take medicines as told by your doctor.  High blood pressure. ? Try to keep your blood pressure below 130/80. ? If your blood pressure cannot be managed through a healthy diet and regular exercise, you may need to take medicines. ? Take medicines as told by your doctor. ? Ask your doctor if you should check your blood pressure at home. ? Have your blood pressure checked every year.  Diabetes. ? Eat a healthy diet and get regular exercise. If your blood sugar (glucose) cannot be managed through diet and exercise, you may need to take medicines. ? Take medicines as told by your doctor.  Talk to your doctor about getting checked for sleeping problems. Signs of a problem can include: ? Snoring a lot. ? Feeling very tired.  Make sure that you manage any other conditions you have. Nutrition  Follow instructions from your doctor about what to eat or drink. You may be told to: ? Eat and drink fewer calories each  day. ? Limit how much salt (sodium) you use to 1,500 milligrams (mg) each day. ? Use only healthy fats for cooking, such as olive oil, canola oil, and sunflower oil. ? Eat healthy foods. To do this:  Choose foods that are high in fiber. These include whole grains, and fresh fruits and vegetables.  Eat at least 5 servings of fruits and vegetables a day. Try to fill one-half of your plate with fruits and vegetables at each meal.  Choose  low-fat (lean) proteins. These include low-fat cuts of meat, chicken without skin, fish, tofu, beans, and nuts.  Eat low-fat dairy products. ? Avoid foods that:  Are high in salt.  Have saturated fat.  Have trans fat.  Have cholesterol.  Are processed or pre-made. ? Count how many carbohydrates you eat and drink each day.   Lifestyle  If you drink alcohol: ? Limit how much you have to:  0-1 drink a day for women who are not pregnant.  0-2 drinks a day for men. ? Know how much alcohol is in your drink. In the [place] one drink equals one 12 oz bottle of beer ( ), one 5 oz glass of wine ( ), or one 1 oz glass of hard liquor (54mL).  Do not smoke or use any products that have nicotine or tobacco. If you need help quitting, ask your doctor.  Avoid secondhand smoke.  Do not use drugs. Activity  Try to stay at a healthy weight.  Get at least 30 minutes of exercise on most days, such as: ? Fast walking. ? Biking. ? Swimming.   Medicines  Take over-the-counter and prescription medicines only as told by your doctor.  Avoid taking birth control pills. Talk to your doctor about the risks of taking birth control pills if: ? You are over 41 years old. ? You smoke. ? You get very bad headaches. ? You have had a blood clot. Where to find more information  American Stroke Association: www.strokeassociation.org Get help right away if:  You or a loved one has any signs of a stroke. "BE FAST" is an easy way to remember the warning signs: ? B - Balance. Dizziness, sudden trouble walking, or loss of balance. ? E - Eyes. Trouble seeing or a change in how you see. ? F - Face. Sudden weakness or loss of feeling of the face. The face or eyelid may droop on one side. ? A - Arms. Weakness or loss of feeling in an arm. This happens all of a sudden and most often on one side of the body. ? S - Speech. Sudden trouble speaking, slurred speech, or trouble understanding what people  say. ? T - Time. Time to call emergency services. Write down what time symptoms started.  You or a loved one has other signs of a stroke, such as: ? A sudden, very bad headache with no known cause. ? Feeling like you may vomit (nausea). ? Vomiting. ? A seizure. These symptoms may be an emergency. Get help right away. Call your local emergency services (911 in the [place]  Do not wait to see if the symptoms will go away.  Do not drive yourself to the hospital. Summary  You can help to prevent a stroke by eating healthy, exercising, and not smoking. It also helps to manage any health problems you have.  Do not smoke or use any products that contain nicotine or tobacco.  Get help right away if you or a loved one has any signs  of a stroke. This information is not intended to replace advice given to you by your health care provider. Make sure you discuss any questions you have with your health care provider. Document Revised: 08/26/2019 Document Reviewed: 08/26/2019 Elsevier Patient Education  2021 [employer].

## 2020-06-03 NOTE — Progress Notes (Signed)
[facility] [address] [place]. [place] [address] [phone]       HOSPITAL FOLLOW UP NOTE  [name] Date of Birth:  12-30-1960 Medical Record Number:  [id number]   Reason for Referral:  hospital stroke follow up    SUBJECTIVE:   CHIEF COMPLAINT:  Chief Complaint  Patient presents with  . Follow-up    RM 14 with daughter Crystal Boyer) Pt is having pain in back of neck, short of breath and forgetful at times.     HPI:   Crystal Boyer is a60 yo female who presented on 04/30/2020 by EMS after seeing her PCP forleftfacial droop that began 5 days ago. She had ashort period of dysarthria on the day symptoms were first experienced thatquickly resolved. Has had no aphasia.No history of stroke.On examination, her positive findings are left facial droop, + finger roll test on the left,subtle LUE weakness and left sided extinction to DSS.   Personally reviewed hospitalization pertinent progress notes, lab work and imaging with summary provided.  Evaluated by Dr. Roda Boyer with stroke work-up revealing foci of restricted diffusion within the right centrum semiovale and corona radiata, consistent with acute/subacute infarcts in a watershed distribution likely in setting of large vessel disease from right M2 occlusion or near occlusion given risk factors and multifocal intracranial stenosis, R M2 occlusion/near occlusion likely due to arthrosclerosis however cardioembolic source cannot be completely ruled out.  Recommended follow-up outpatient IR for possible intervention/stent placement as well as 30-day cardiac event monitoring to rule out A. fib.  Recommended aspirin and Brilinta for 30 days then aspirin alone.  HTN stable.  LDL 128 and initiate atorvastatin 80 mg daily.  Uncontrolled DM with A1c 8.2.  Evaluated by therapies with recommended OP PT and discharged home in stable condition.  Stroke: Foci of restricted diffusion within the right centrum  semiovale and corona radiata, consistent with acute/subacute infarcts in a watershed distribution  likely secondary to with severe, near occlusive stenosis versus near occlusive thrombus superior  division proximal M2 right MCA branch vessel although embolic source cannot be completely ruled out  Code Stroke CT head No acute abnormality.  Redemonstrated patchy and ill-defined hypoattenuation within the bilateral cerebral white matter, which is overall moderate in severity but advanced for age. Findings are nonspecific, but most often secondary to chronic small vessel ischemia.Mild bilateral maxillary sinus mucosal thickening.  CTA head & neck  Unchanged short-segment apparent flow gap within a superior  division proximal M2 right MCA branch vessel: severe, near occlusive stenosis versus near occlusive thrombus. Mild stenosis of the distal M1 right middle cerebral artery. Calcified plaque within the intracranial ICAs with mild/moderate stenosis of the paraclinoid segments bilaterally. Mild atherosclerotic narrowing of the V4 right vertebral artery.  MRI : Foci of restricted diffusion within the right centrum semiovale and corona radiata, consistent with acute/subacute infarcts in a watershed distribution.  TCD bubble study: Negative  2D Echo: EF 55-60%, No thrombus, wall motion abnormality or shunt found.    LDL 128  HgbA1c 8.2  Therapy recommendations:  Cleared for home   Disposition:  Home with home health PT, planning to DC today  30 day cardiac monitoring request sent to cards master  Plan is for ASA 81mg  daily and Brillinta 90mg  BID for 30 days then ASA 81 mg alone.    Today, 06/03/2020, [name] is being seen for hospital follow-up accompanied by her daughter  Since discharge, she does report slowly recovering although continues to experience occasional slurred  speech, drooling, short-term memory difficulties, blurred vision and headache. She was evaluated in the ED on  4/12 for concerns of increased dyspnea after her first dose of Brilinta the night prior (she was not aware that this needed to be started prior) as well as 1 week visual changes, dizziness and headache.  MRI negative for acute findings and recommended discontinuing Brilinta and initiate Plavix in addition to aspirin.  She apparently has not started taking Plavix but has remained on aspirin without side effects.  Remains on atorvastatin 80 mg daily without myalgias.  Blood pressure today elevated at 178/94 - per patient, she has had great difficulty managing blood pressure even prior to her stroke.  She is not currently monitoring at home.  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  Frontal headaches and reported blurred vision typically occur mid to later afternoon.  She has not yet been seen by ophthalmology.  She has not had follow-up with IR Dr. Corliss Boyer as advised at discharge.  She denies receiving cardiac monitor although per review of epic, this was sent to her approximately 1 month ago.  No further concerns at this time.    ROS:   14 system review of systems performed and negative with exception of those listed in HPI  PMH:  Past Medical History:  Diagnosis Date  . Allergy   . Asthma   . Depression   . Diabetes mellitus   . Gout 12 days ago  . Hypertension     PSH:  Past Surgical History:  Procedure Laterality Date  . ABDOMINAL HYSTERECTOMY    . ANKLE FRACTURE SURGERY     right ankle  . CESAREAN SECTION      Social History:  Social History   Socioeconomic History  . Marital status: Single    Spouse name: Not on file  . Number of children: Not on file  . Years of education: Not on file  . Highest education level: Not on file  Occupational History  . Not on file  Tobacco Use  . Smoking status: Never Smoker  . Smokeless tobacco: Never Used  Vaping Use  . Vaping Use: Never used  Substance and Sexual Activity  . Alcohol use: No     Alcohol/week: 0.0 standard drinks    Comment: social  . Drug use: Yes    Types: Marijuana    Comment: 1x monthly  . Sexual activity: Yes    Birth control/protection: Condom  Other Topics Concern  . Not on file  Social History Narrative  . Not on file   Social Determinants of Health   Financial Resource Strain: Not on file  Food Insecurity: Not on file  Transportation Needs: Not on file  Physical Activity: Not on file  Stress: Not on file  Social Connections: Not on file  Intimate Partner Violence: Not on file    Family History:  Family History  Problem Relation Age of Onset  . Hypertension Mother   . Diabetes Mother   . Hypertension Father   . Diabetes Father   . Colon cancer Neg Hx     Medications:   Current Outpatient Medications on File Prior to Visit  Medication Sig Dispense Refill  . aspirin EC 81 MG EC tablet Take 1 tablet (81 mg total) by mouth daily. Swallow whole. 30 tablet 11  . atorvastatin (LIPITOR) 80 MG tablet Take 1 tablet (80 mg total) by mouth daily. 30 tablet 2  . LANTUS SOLOSTAR 100 UNIT/ML  Solostar Pen Inject 30 Units into the skin at bedtime. 15 mL 5  . losartan (COZAAR) 50 MG tablet Take 1 tablet (50 mg total) by mouth daily. 30 tablet 0  . [DISCONTINUED] albuterol (PROVENTIL HFA;VENTOLIN HFA) 108 (90 Base) MCG/ACT inhaler Inhale 2 puffs into the lungs every 6 (six) hours as needed for wheezing or shortness of breath. (Patient not taking: Reported on 07/03/2018) 1 Inhaler 1   No current facility-administered medications on file prior to visit.    Allergies:   Allergies  Allergen Reactions  . Eggs Or Egg-Derived Products Diarrhea  . Actos [Pioglitazone] Other (See Comments)    Made the patient feel like she was "going to die"  . Brilinta [Ticagrelor]     Shortness of breath   . Glipizide Other (See Comments)    Made patient feel like she was "going to pass out and die"  . Tramadol Rash      OBJECTIVE:  Physical Exam  Vitals:    06/03/20 0807  BP: (!) 178/94  Pulse: 67  Weight: 214 lb (97.1 kg)  Height: 5\' 6"  (1.676 m)   Body mass index is 34.54 kg/m. No exam data present  General: well developed, well nourished, very pleasant middle-age African-American female, seated, in no evident distress Head: head normocephalic and atraumatic.   Neck: supple with no carotid or supraclavicular bruits Cardiovascular: regular rate and rhythm, no murmurs Musculoskeletal: no deformity Skin:  no rash/petichiae Vascular:  Normal pulses all extremities   Neurologic Exam Mental Status: Awake and fully alert.   Mild dysarthria.  No evidence of aphasia.  Oriented to place and time. Recent memory subjectively impaired and remote memory intact. Attention span, concentration and fund of knowledge appropriate during visit. Mood and affect appropriate.  Cranial Nerves: Fundoscopic exam reveals sharp disc margins. Pupils equal, briskly reactive to light. Extraocular movements full without nystagmus. Visual fields full to confrontation. Hearing intact. Facial sensation intact.  Slight left lower facial weakness when smiling. Tongue and palate moves normally and symmetrically.  Motor: Normal bulk and tone. Normal strength in all tested extremity muscles except slightly decreased left hand dexterity Sensory.: intact to touch , pinprick , position and vibratory sensation.  Coordination: Rapid alternating movements normal in all extremities except slightly decreased left hand dexterity. Finger-to-nose and heel-to-shin performed accurately bilaterally.  Orbits right arm over left arm Gait and Station: Arises from chair without difficulty. Stance is normal. Gait demonstrates normal stride length and balance without use of assistive device.  Reflexes: 1+ and symmetric. Toes downgoing.     NIHSS  2 Modified Rankin  1      ASSESSMENT: Crystal Boyer is a 60 y.o. year old female presented with 5 day onset of left facial droop and short  period of dysarthria with evidence of mild LUE weakness on 04/30/2020 with stroke work-up revealing right centrum semiovale and corona radiata infarcts likely secondary to R M2 occlusion/near occlusion in setting of arthrosclerosis although embolic source cannot be completely ruled out. Vascular risk factors include HTN, HLD, DM, and diffuse intracranial stenosis.  He evaluation 4/14 for complaints of SOB after taking Brilinta dose (first dose that night prior) in 1 week of blurry vision, headache and dizziness -imaging negative for acute infarct -Brilinta discontinued and recommend initiating Plavix in addition to aspirin     PLAN:  1. R SO and CR infarct :  a. Residual deficit: Left lower facial droop and dysarthria.  Also reports blurred vision, headache and short-term memory impairment. Recommend  ophthalmology evaluation regarding visual complaints.  Recommend occasional use of Tylenol for mild headache but if persists or worsens, she was advised to call office.  Discussed memory exercises for memory concerns but hopefully will improve as she gradually returns back to prior activities b. Provided information regarding cardiac monitor to contact company who mailed monitor almost 30 days ago -discussed indication for monitor c. Advised to start Plavix (as advised on 4/14) and continue aspirin 81 mg daily  and atorvastatin 80 mg daily for secondary stroke prevention.  d. Bonnee Quin to Brilinta due to side effects of shortness of breath e. Discussed secondary stroke prevention measures and importance of close PCP follow up for aggressive stroke risk factor management  2. HTN: BP goal 130-160.  Remains uncontrolled on losartan 50 mg daily.  Recommend initiating amlodipine 2.5 mg daily and advised importance of monitoring BP routinely at home.  Advised to follow-up with PCP for further management or may need cardiology referral 3. HLD: LDL goal <70. Recent LDL 128 - started on atorvastatin 80 mg daily  during recent admission.  Request follow-up with PCP in the next 1 to 2 months for repeat lipid panel ongoing prescribing of statin 4. DMII: A1c goal<7.0. Recent A1c 8.2.  Subjectively reports improvement of glucose levels currently monitored by PCP 5. Intracranial stenosis: Referral placed to Dr. Corliss Boyer as recommended at hospital discharge for further evaluation of MCA stenosis and possible intervention    Follow up in 3 months or call earlier if needed   CC:  [facility] provider: Dr. Pearlean Brownie PCP: [facility]    I spent 50 minutes of face-to-face and non-face-to-face time with patient and daughter.  This included previsit chart review including recent hospitalization pertinent progress notes, lab work and imaging, lab review, study review, order entry, electronic health record documentation, patient education regarding recent stroke and possible etiology, residual deficits, indication for IR evaluation and cardiac monitor, importance of managing stroke risk factors and answered all other questions to patient and daughters satisfaction   Ihor Austin, [facility]  [facility] [address] [place], [place] [address]  Phone [phone] Fax [phone] Note: This document was prepared with digital dictation and possible smart phrase technology. Any transcriptional errors that result from this process are unintentional.

## 2020-06-04 NOTE — Progress Notes (Signed)
I agree with the above plan 

## 2020-06-09 ENCOUNTER — Inpatient Hospital Stay: Payer: Medicaid Other | Admitting: Adult Health

## 2020-06-10 ENCOUNTER — Telehealth (HOSPITAL_COMMUNITY): Payer: Self-pay

## 2020-06-10 NOTE — Telephone Encounter (Signed)
Called to schedule diagnostic angiogram, no answer, left vm. AW  

## 2020-06-15 ENCOUNTER — Other Ambulatory Visit (HOSPITAL_COMMUNITY): Payer: Self-pay | Admitting: Interventional Radiology

## 2020-06-15 DIAGNOSIS — I6521 Occlusion and stenosis of right carotid artery: Secondary | ICD-10-CM

## 2020-06-17 ENCOUNTER — Ambulatory Visit (HOSPITAL_COMMUNITY): Payer: Medicaid Other

## 2020-06-18 ENCOUNTER — Other Ambulatory Visit: Payer: Self-pay

## 2020-06-18 ENCOUNTER — Ambulatory Visit (HOSPITAL_COMMUNITY)
Admission: RE | Admit: 2020-06-18 | Discharge: 2020-06-18 | Disposition: A | Discharge status: Home / Self Care | Payer: Medicaid Other | Source: Ambulatory Visit | Attending: Interventional Radiology | Admitting: Interventional Radiology

## 2020-06-18 DIAGNOSIS — I6521 Occlusion and stenosis of right carotid artery: Secondary | ICD-10-CM

## 2020-06-19 HISTORY — PX: IR RADIOLOGIST EVAL & MGMT: IMG5224

## 2020-06-25 ENCOUNTER — Emergency Department (HOSPITAL_COMMUNITY)
Admission: EM | Admit: 2020-06-25 | Discharge: 2020-06-25 | Disposition: A | Discharge status: Home / Self Care | Payer: Medicaid Other | Attending: Emergency Medicine | Admitting: Emergency Medicine

## 2020-06-25 ENCOUNTER — Telehealth (HOSPITAL_COMMUNITY): Payer: Self-pay | Admitting: Physician Assistant

## 2020-06-25 ENCOUNTER — Encounter (HOSPITAL_COMMUNITY): Payer: Self-pay | Admitting: Pharmacy Technician

## 2020-06-25 ENCOUNTER — Telehealth (HOSPITAL_COMMUNITY): Payer: Self-pay

## 2020-06-25 ENCOUNTER — Emergency Department (HOSPITAL_COMMUNITY): Payer: Medicaid Other

## 2020-06-25 ENCOUNTER — Other Ambulatory Visit (HOSPITAL_COMMUNITY): Payer: Self-pay | Admitting: Interventional Radiology

## 2020-06-25 DIAGNOSIS — Z79899 Other long term (current) drug therapy: Secondary | ICD-10-CM | POA: Insufficient documentation

## 2020-06-25 DIAGNOSIS — R110 Nausea: Secondary | ICD-10-CM | POA: Diagnosis not present

## 2020-06-25 DIAGNOSIS — Z7902 Long term (current) use of antithrombotics/antiplatelets: Secondary | ICD-10-CM | POA: Insufficient documentation

## 2020-06-25 DIAGNOSIS — I10 Essential (primary) hypertension: Secondary | ICD-10-CM | POA: Insufficient documentation

## 2020-06-25 DIAGNOSIS — Z794 Long term (current) use of insulin: Secondary | ICD-10-CM | POA: Diagnosis not present

## 2020-06-25 DIAGNOSIS — J45909 Unspecified asthma, uncomplicated: Secondary | ICD-10-CM | POA: Insufficient documentation

## 2020-06-25 DIAGNOSIS — R0602 Shortness of breath: Secondary | ICD-10-CM | POA: Diagnosis not present

## 2020-06-25 DIAGNOSIS — R42 Dizziness and giddiness: Secondary | ICD-10-CM | POA: Diagnosis not present

## 2020-06-25 DIAGNOSIS — R519 Headache, unspecified: Secondary | ICD-10-CM | POA: Diagnosis not present

## 2020-06-25 DIAGNOSIS — Z7982 Long term (current) use of aspirin: Secondary | ICD-10-CM | POA: Insufficient documentation

## 2020-06-25 DIAGNOSIS — E119 Type 2 diabetes mellitus without complications: Secondary | ICD-10-CM | POA: Diagnosis not present

## 2020-06-25 DIAGNOSIS — I6521 Occlusion and stenosis of right carotid artery: Secondary | ICD-10-CM

## 2020-06-25 LAB — BASIC METABOLIC PANEL
Anion gap: 6 (ref 5–15)
BUN: 28 mg/dL — ABNORMAL HIGH (ref 6–20)
CO2: 24 mmol/L (ref 22–32)
Calcium: 9.6 mg/dL (ref 8.9–10.3)
Chloride: 107 mmol/L (ref 98–111)
Creatinine, Ser: 1.12 mg/dL — ABNORMAL HIGH (ref 0.44–1.00)
GFR, Estimated: 57 mL/min — ABNORMAL LOW (ref >60)
Glucose, Bld: 195 mg/dL — ABNORMAL HIGH (ref 70–99)
Potassium: 4.5 mmol/L (ref 3.5–5.1)
Sodium: 137 mmol/L (ref 135–145)

## 2020-06-25 LAB — CBC
HCT: 41.4 % (ref 36.0–46.0)
Hemoglobin: 13.2 g/dL (ref 12.0–15.0)
MCH: 28 pg (ref 26.0–34.0)
MCHC: 31.9 g/dL (ref 30.0–36.0)
MCV: 87.9 fL (ref 80.0–100.0)
Platelets: 291 10*3/uL (ref 150–400)
RBC: 4.71 MIL/uL (ref 3.87–5.11)
RDW: 13.4 % (ref 11.5–15.5)
WBC: 7 10*3/uL (ref 4.0–10.5)
nRBC: 0 % (ref 0.0–0.2)

## 2020-06-25 LAB — TROPONIN I (HIGH SENSITIVITY): Troponin I (High Sensitivity): 3 ng/L (ref <18)

## 2020-06-25 LAB — BRAIN NATRIURETIC PEPTIDE: B Natriuretic Peptide: 16.8 pg/mL (ref 0.0–100.0)

## 2020-06-25 MED ORDER — GADOBUTROL 1 MMOL/ML IV SOLN
10.0000 mL | Freq: Once | INTRAVENOUS | Status: AC | PRN
  Administered 2020-06-25: 10 mL via INTRAVENOUS

## 2020-06-25 MED ORDER — FENTANYL CITRATE (PF) 100 MCG/2ML IJ SOLN
50.0000 ug | Freq: Once | INTRAMUSCULAR | Status: AC
  Administered 2020-06-25: 50 ug via INTRAVENOUS
  Filled 2020-06-25: qty 2

## 2020-06-25 MED ORDER — LORAZEPAM 1 MG PO TABS
1.0000 mg | ORAL_TABLET | Freq: Once | ORAL | Status: AC | PRN
  Administered 2020-06-25: 1 mg via ORAL
  Filled 2020-06-25: qty 1

## 2020-06-25 MED ORDER — ONDANSETRON HCL 4 MG/2ML IJ SOLN
4.0000 mg | Freq: Once | INTRAMUSCULAR | Status: AC
  Administered 2020-06-25: 4 mg via INTRAVENOUS
  Filled 2020-06-25: qty 2

## 2020-06-25 MED ORDER — MECLIZINE HCL 25 MG PO TABS: 25.0000 mg | ORAL_TABLET | Freq: Three times a day (TID) | ORAL | 0 refills | Status: DC | PRN

## 2020-06-25 NOTE — Telephone Encounter (Signed)
Called to schedule diagnostic angiogram, both phones in the system aren't working. Will try back again later. AW

## 2020-06-25 NOTE — Telephone Encounter (Signed)
Patient called [facility] asking for advice regarding dyspnea and dizziness which started out of nowhere today and has not happened before. She reports being "very short of breath" and "very dizzy." She was advised to present to the ED for evaluation of these symptoms but stated that she had been to the ED and "all they wanted to do was an MRI." She declined to present to the ED for evaluation of these new symptoms after repeated encouragement. At the end of our conversation she is requesting that she be called to schedule her procedure - I will have the schedulers reach out to her about this.  Lynnette Caffey, PA-C

## 2020-06-25 NOTE — ED Triage Notes (Signed)
Pt here POV with reports of getting hot, feeling nauseated and headache onset while driving. Pt with hx CVA and wanted to be evaluated. No neuro deficits noted.

## 2020-06-25 NOTE — Discharge Instructions (Addendum)
Please follow up as soon as spoke with your primary care doctor and your neurologist. You are having a headache. No specific cause was found today for your headache. It may have been a migraine or other cause of headache. Stress, anxiety, fatigue, and depression are common triggers for headaches. Your headache today does not appear to be life-threatening or require hospitalization, but often the exact cause of headaches is not determined in the emergency department. Therefore, follow-up with your doctor is very important to find out what may have caused your headache, and whether or not you need any further diagnostic testing or treatment. Sometimes headaches can appear benign (not harmful), but then more serious symptoms can develop which should prompt an immediate re-evaluation by your doctor or the emergency department. SEEK MEDICAL ATTENTION IF: You develop possible problems with medications prescribed.  The medications don't resolve your headache, if it recurs , or if you have multiple episodes of vomiting or can't take fluids. You have a change from the usual headache. RETURN IMMEDIATELY IF you develop a sudden, severe headache or confusion, become poorly responsive or faint, develop a fever above 100.50F or problem breathing, have a change in speech, vision, swallowing, or understanding, or develop new weakness, numbness, tingling, incoordination, or have a seizure.

## 2020-06-25 NOTE — ED Provider Notes (Signed)
[facility] EMERGENCY DEPARTMENT Provider Note   CSN: [id number] Arrival date & time: 06/25/20  1315     History No chief complaint on file.   Crystal Boyer is a 60 y.o. female.  Who presents emergency department with chief complaint of headache and shortness of breath.  Patient states that she was helping take a neighbor to an appointment and when she walked inside she sudden onset of shortness of breath followed by headache on the right posterior portion of her neck and occiput that radiated around to the front of her head.  She had onset of severe nausea and vertigo simultaneously.  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  She states that she called Dr. Fatima Sanger office today and was told that they have been trying to get in touch with her because they had already scheduled her for angiogram and that she needed to go ahead and come to the hospital so that they could do the procedure in the morning.  She continues to have a headache which she rates at 8 out of 10 she denies any active shortness of breath, vertigo or nausea.  HPI     Past Medical History:  Diagnosis Date  . Allergy   . Asthma   . Depression   . Diabetes mellitus   . Gout 12 days ago  . Hypertension     Patient Active Problem List   Diagnosis Date Noted  . CVA (cerebral vascular accident) (HCC) 04/30/2020  . Achilles tendon contracture, left 09/25/2017  . Posterior tibial tendon dysfunction (PTTD) of right lower extremity 09/25/2017  . DIABETES MELLITUS, TYPE II 11/27/2006  . HYPERTENSION 11/27/2006  . 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 ABDOMINAL HYSTERECTOMY    . ANKLE FRACTURE SURGERY     right ankle  . CESAREAN SECTION    . IR RADIOLOGIST EVAL & MGMT  06/19/2020     OB History    No obstetric history on file.     Family History  Problem Relation Age of Onset  . Hypertension Mother   . Diabetes Mother   . Hypertension Father   . Diabetes Father   . Colon cancer Neg Hx     Social History   Tobacco Use  . Smoking status: Never Smoker  . Smokeless tobacco: Never Used  Vaping Use  . Vaping Use: Never used  Substance Use Topics  . Alcohol use: No    Alcohol/week: 0.0 standard drinks    Comment: social  . Drug use: Yes    Types: Marijuana    Comment: 1x monthly    Home Medications Prior to Admission medications   Medication Sig Start Date End Date Taking? Authorizing Provider  meclizine (ANTIVERT) 25 MG tablet Take 1 tablet (25 mg total) by mouth 3 (three) times daily as needed for dizziness. 06/25/20  Yes Madie Cahn, PA-C  amLODipine (NORVASC) 2.5 MG tablet Take 1 tablet (2.5 mg total) by mouth daily. 06/03/20   Ihor Austin, NP  aspirin EC 81 MG EC tablet Take 1 tablet (81 mg total) by mouth daily. Swallow whole. 05/02/20   Dellia Cloud, MD  atorvastatin (LIPITOR) 80 MG tablet Take 1 tablet (80 mg total) by mouth daily. 05/02/20 07/31/20  Dellia Cloud, MD  clopidogrel (PLAVIX) 75 MG tablet Take  1 tablet (75 mg total) by mouth daily. 06/03/20   Ihor Austin, NP  LANTUS SOLOSTAR 100 UNIT/ML Solostar Pen Inject 30 Units into the skin at bedtime. 05/01/20   Dellia Cloud, MD  losartan (COZAAR) 50 MG tablet Take 1 tablet (50 mg total) by mouth daily. 10/17/18 11/16/18  Wieters, Hallie C, PA-C  albuterol (PROVENTIL HFA;VENTOLIN HFA) 108 (90 Base) MCG/ACT inhaler Inhale 2 puffs into the lungs every 6 (six) hours as needed for wheezing or shortness of breath. Patient not taking: Reported on 07/03/2018 04/24/18 01/13/19  Arnaldo Natal, MD    Allergies    Eggs or egg-derived products, Actos [pioglitazone], Brilinta [ticagrelor], Glipizide, and Tramadol  Review of Systems   Review of Systems Ten systems reviewed and are negative for acute change, except  as noted in the HPI.   Physical Exam Updated Vital Signs BP (!) 148/92   Pulse 79   Temp 98 F (36.7 C) (Oral)   Resp 18   SpO2 96%   Physical Exam Vitals and nursing note reviewed.  Constitutional:      General: She is not in acute distress.    Appearance: She is well-developed. She is not diaphoretic.  HENT:     Head: Normocephalic and atraumatic.  Eyes:     General: No scleral icterus.    Extraocular Movements: Extraocular movements intact.     Conjunctiva/sclera: Conjunctivae normal.     Pupils: Pupils are equal, round, and reactive to light.  Cardiovascular:     Rate and Rhythm: Normal rate and regular rhythm.     Heart sounds: Normal heart sounds. No murmur heard. No friction rub. No gallop.   Pulmonary:     Effort: Pulmonary effort is normal. No respiratory distress.     Breath sounds: Normal breath sounds.  Abdominal:     General: Bowel sounds are normal. There is no distension.     Palpations: Abdomen is soft. There is no mass.     Tenderness: There is no abdominal tenderness. There is no guarding.  Musculoskeletal:     Cervical back: Normal range of motion.  Skin:    General: Skin is warm and dry.  Neurological:     General: No focal deficit present.     Mental Status: She is alert and oriented to person, place, and time.     Comments: Speech is clear and goal oriented, follows commands Major Cranial nerves without deficit, no facial droop Normal strength in upper and lower extremities bilaterally including dorsiflexion and plantar flexion, strong and equal grip strength Sensation normal to light and sharp touch Moves extremities without ataxia, coordination intact Normal finger to nose   no pronator drift Normal gait    Psychiatric:        Behavior: Behavior normal.     ED Results / Procedures / Treatments   Labs (all labs ordered are listed, but only abnormal results are displayed) Labs Reviewed  BASIC METABOLIC PANEL - Abnormal; Notable for the  following components:      Result Value   Glucose, Bld 195 (*)    BUN 28 (*)    Creatinine, Ser 1.12 (*)    GFR, Estimated 57 (*)    All other components within normal limits  CBC  BRAIN NATRIURETIC PEPTIDE  URINALYSIS, ROUTINE W REFLEX MICROSCOPIC  CBG MONITORING, ED  TROPONIN I (HIGH SENSITIVITY)  TROPONIN I (HIGH SENSITIVITY)    EKG EKG Interpretation  Date/Time:  Thursday Jun 25 2020 19:43:53 EDT Ventricular Rate:  81 PR Interval:  152 QRS Duration: 86 QT Interval:  368 QTC Calculation: 428 R Axis:   24 Text Interpretation: Sinus rhythm T wave changes similar to April 2022 Confirmed by Pricilla Loveless [phone]) on 06/25/2020 8:55:20 PM   Radiology MR ANGIO HEAD WO CONTRAST  Result Date: 06/25/2020 CLINICAL DATA:  Weakness and headache EXAM: MRI HEAD WITHOUT CONTRAST MRA HEAD WITHOUT CONTRAST MRA OF THE NECK WITHOUT AND WITH CONTRAST TECHNIQUE: Multiplanar, multi-echo pulse sequences of the brain and surrounding structures were acquired without intravenous contrast. Angiographic images of the Circle of Willis were acquired using MRA technique without intravenous contrast. Angiographic images of the neck were acquired using MRA technique without and with intravenous contrast. Carotid stenosis measurements (when applicable) are obtained utilizing NASCET criteria, using the distal internal carotid diameter as the denominator. CONTRAST:  10mL GADAVIST GADOBUTROL 1 MMOL/ML IV SOLN COMPARISON:  05/19/2020 FINDINGS: MR HEAD FINDINGS Brain: Decreased diffusion abnormality in the right frontal lobe. No acute or chronic hemorrhage. There is multifocal hyperintense T2-weighted signal within the white matter. Parenchymal volume and CSF spaces are normal. The midline structures are normal. Vascular: Major flow voids are preserved. Skull and upper cervical spine: Normal calvarium and skull base. Visualized upper cervical spine and soft tissues are normal. Sinuses/Orbits:No paranasal sinus fluid  levels or advanced mucosal thickening. No mastoid or middle ear effusion. Normal orbits. MRA HEAD FINDINGS POSTERIOR CIRCULATION: --Vertebral arteries: Normal --Inferior cerebellar arteries: Normal. --Basilar artery: Normal. --Superior cerebellar arteries: Normal. --Posterior cerebral arteries: Normal. ANTERIOR CIRCULATION: --Intracranial internal carotid arteries: Normal. --Anterior cerebral arteries (ACA): Normal. --Middle cerebral arteries (MCA): Normal. ANATOMIC VARIANTS: None MRA NECK FINDINGS Aortic arch: Normal Right carotid system: Normal Left carotid system: Normal Vertebral arteries: Codominant and normal Other: None. IMPRESSION: 1. Expected evolution of signal changes at the site of recent infarct of the right frontal lobe. No hemorrhage or mass effect. 2. Normal MRA of the head and neck. Electronically Signed   By: Deatra Beel M.D.   On: 06/25/2020 22:16   MR Angiogram Neck W or Wo Contrast  Result Date: 06/25/2020 CLINICAL DATA:  Weakness and headache EXAM: MRI HEAD WITHOUT CONTRAST MRA HEAD WITHOUT CONTRAST MRA OF THE NECK WITHOUT AND WITH CONTRAST TECHNIQUE: Multiplanar, multi-echo pulse sequences of the brain and surrounding structures were acquired without intravenous contrast. Angiographic images of the Circle of Willis were acquired using MRA technique without intravenous contrast. Angiographic images of the neck were acquired using MRA technique without and with intravenous contrast. Carotid stenosis measurements (when applicable) are obtained utilizing NASCET criteria, using the distal internal carotid diameter as the denominator. CONTRAST:  10mL GADAVIST GADOBUTROL 1 MMOL/ML IV SOLN COMPARISON:  05/19/2020 FINDINGS: MR HEAD FINDINGS Brain: Decreased diffusion abnormality in the right frontal lobe. No acute or chronic hemorrhage. There is multifocal hyperintense T2-weighted signal within the white matter. Parenchymal volume and CSF spaces are normal. The midline structures are normal.  Vascular: Major flow voids are preserved. Skull and upper cervical spine: Normal calvarium and skull base. Visualized upper cervical spine and soft tissues are normal. Sinuses/Orbits:No paranasal sinus fluid levels or advanced mucosal thickening. No mastoid or middle ear effusion. Normal orbits. MRA HEAD FINDINGS POSTERIOR CIRCULATION: --Vertebral arteries: Normal --Inferior cerebellar arteries: Normal. --Basilar artery: Normal. --Superior cerebellar arteries: Normal. --Posterior cerebral arteries: Normal. ANTERIOR CIRCULATION: --Intracranial internal carotid arteries: Normal. --Anterior cerebral arteries (ACA): Normal. --Middle cerebral arteries (MCA): Normal. ANATOMIC VARIANTS: None MRA NECK FINDINGS Aortic arch: Normal Right carotid system: Normal Left carotid system: Normal Vertebral arteries: Codominant  and normal Other: None. IMPRESSION: 1. Expected evolution of signal changes at the site of recent infarct of the right frontal lobe. No hemorrhage or mass effect. 2. Normal MRA of the head and neck. Electronically Signed   By: Deatra RobinsonKevin  Herman M.D.   On: 06/25/2020 22:16   MR BRAIN WO CONTRAST  Result Date: 06/25/2020 CLINICAL DATA:  Weakness and headache EXAM: MRI HEAD WITHOUT CONTRAST MRA HEAD WITHOUT CONTRAST MRA OF THE NECK WITHOUT AND WITH CONTRAST TECHNIQUE: Multiplanar, multi-echo pulse sequences of the brain and surrounding structures were acquired without intravenous contrast. Angiographic images of the Circle of Willis were acquired using MRA technique without intravenous contrast. Angiographic images of the neck were acquired using MRA technique without and with intravenous contrast. Carotid stenosis measurements (when applicable) are obtained utilizing NASCET criteria, using the distal internal carotid diameter as the denominator. CONTRAST:  10mL GADAVIST GADOBUTROL 1 MMOL/ML IV SOLN COMPARISON:  05/19/2020 FINDINGS: MR HEAD FINDINGS Brain: Decreased diffusion abnormality in the right frontal lobe.  No acute or chronic hemorrhage. There is multifocal hyperintense T2-weighted signal within the white matter. Parenchymal volume and CSF spaces are normal. The midline structures are normal. Vascular: Major flow voids are preserved. Skull and upper cervical spine: Normal calvarium and skull base. Visualized upper cervical spine and soft tissues are normal. Sinuses/Orbits:No paranasal sinus fluid levels or advanced mucosal thickening. No mastoid or middle ear effusion. Normal orbits. MRA HEAD FINDINGS POSTERIOR CIRCULATION: --Vertebral arteries: Normal --Inferior cerebellar arteries: Normal. --Basilar artery: Normal. --Superior cerebellar arteries: Normal. --Posterior cerebral arteries: Normal. ANTERIOR CIRCULATION: --Intracranial internal carotid arteries: Normal. --Anterior cerebral arteries (ACA): Normal. --Middle cerebral arteries (MCA): Normal. ANATOMIC VARIANTS: None MRA NECK FINDINGS Aortic arch: Normal Right carotid system: Normal Left carotid system: Normal Vertebral arteries: Codominant and normal Other: None. IMPRESSION: 1. Expected evolution of signal changes at the site of recent infarct of the right frontal lobe. No hemorrhage or mass effect. 2. Normal MRA of the head and neck. Electronically Signed   By: Deatra RobinsonKevin  Herman M.D.   On: 06/25/2020 22:16   DG Chest Port 1 View  Result Date: 06/25/2020 CLINICAL DATA:  Shortness of breath and chest pain. EXAM: PORTABLE CHEST 1 VIEW COMPARISON:  May 19, 2020 FINDINGS: The lungs are hyperinflated. Mild linear scarring and/or atelectasis is again seen within the left lung base. There is no evidence of acute infiltrate, pleural effusion or pneumothorax. The heart size and mediastinal contours are within normal limits. There is mild calcification of the aortic arch. The visualized skeletal structures are unremarkable. IMPRESSION: Stable exam without acute or active cardiopulmonary disease. Electronically Signed   By: Aram Candelahaddeus  Houston M.D.   On: 06/25/2020 18:53     Procedures Procedures   Medications Ordered in ED Medications  fentaNYL (SUBLIMAZE) injection 50 mcg (50 mcg Intravenous Given 06/25/20 1928)  ondansetron (ZOFRAN) injection 4 mg (4 mg Intravenous Given 06/25/20 1928)  LORazepam (ATIVAN) tablet 1 mg (1 mg Oral Given 06/25/20 1927)  gadobutrol (GADAVIST) 1 MMOL/ML injection 10 mL (10 mLs Intravenous Contrast Given 06/25/20 2202)    ED Course  I have reviewed the triage vital signs and the nursing notes.  Pertinent labs & imaging results that were available during my care of the patient were reviewed by me and considered in my medical decision making (see chart for details).    MDM Rules/Calculators/A&P                          60 year old female  here with onset of headache nausea, shortness of breath.The emergent differential diagnosis for shortness of breath includes, but is not limited to, Pulmonary edema, bronchoconstriction, Pneumonia, Pulmonary embolism, Pneumotherax/ Hemothorax, Dysrythmia, ACS.    I was able to get in contact with Dr. Sherri Rad team in the IR suite and there appears to have been miscommunication.  They were stated that they wanted her to come into the ER for evaluation of her acute symptoms but not because she need to be admitted to have cerebral angiogram.  Discussed the case with Dr. Jerrell Belfast who recommended an MRI/MRA of the brain without contrast and MR a of the neck with contrast.  I reviewed these images which showed no acute stroke or other abnormalities.  I ordered a portable 1 view chest x-ray which also shows no abnormalities. I ordered and reviewed labs that shows mild dehydration on BMP, troponin within normal limits, BMP without abnormality, CBC without abnormality.  Patient is asymptomatic at this time.  EKG shows sinus rhythm at a rate of 81.  Patient appears otherwise appropriate for discharge with close outpatient neurology follow-up.  Discussed return precautions.  Will discharge with  meclizine. Final Clinical Impression(s) / ED Diagnoses Final diagnoses:  Bad headache  SOB (shortness of breath)  Vertigo    Rx / DC Orders ED Discharge Orders         Ordered    meclizine (ANTIVERT) 25 MG tablet  3 times daily PRN        06/25/20 2246           Arthor Captain, PA-C 06/25/20 2348    Pricilla Loveless, MD 06/27/20 1521

## 2020-06-25 NOTE — ED Notes (Signed)
Pt discharged and ambulated out of the ED without difficulty. 

## 2020-06-25 NOTE — ED Provider Notes (Signed)
Emergency Medicine Provider Triage Evaluation Note  Crystal Boyer , a 60 y.o. female  was evaluated in triage.  Pt complains of weakness, nausea, dizziness, posterior headache which started this morning.  Patient has a history of a CVA back in March.  Presented with left-sided facial droop at the time.  She also reports feeling hot, does not know if this is due to the temperature outside.  She has had no difficulty speaking, no facial droop.  No vision loss.  Called [place] neurologic office (followed by Dr. Corliss Skains) and was told to come to the ER.  Per chart review, she does have a IR angio scheduled as an outpatient.  Denies any focal weakness, word slurring, facial droop.  No fevers or chills.  Review of Systems  Positive: As above  Negative: As above  Physical Exam  BP (!) 164/90 (BP Location: Right Arm)   Pulse 96   Temp 97.8 F (36.6 C) (Oral)   Resp 18   SpO2 98%  Gen:   Awake, no distress  Resp:  Normal effort  MSK:   Moves extremities without difficulty  Other:  Cranial nerves II through XII intact.  5/5 strength in upper and lower extremities bilaterally.  Sensations intact.  Moving all 4 extremities without difficulty.  Negative pronator drift, negative heel-to-shin.  Medical Decision Making  Medically screening exam initiated at 1:42 PM.  Appropriate orders placed.  Crystal Boyer was informed that the remainder of the evaluation will be completed by another provider, this initial triage assessment does not replace that evaluation, and the importance of remaining in the ED until their evaluation is complete.  The patient's case with Dr. Adela Lank.  Given no focal neurologic findings, will order basic labs, CT head, stable for further evaluation.   [name], PA-C 06/25/20 1353    Melene Plan, DO 06/25/20 1402

## 2020-06-26 ENCOUNTER — Ambulatory Visit: Payer: Medicaid Other | Admitting: Internal Medicine

## 2020-06-26 NOTE — Progress Notes (Incomplete)
Cardiology Office Note:    Date:  06/26/2020   ID:  Crystal Boyer, DOB 19-Jul-1960, MRN [id number]  PCP:  [facility]  Cardiologist:  None  Electrophysiologist:  None   Referring MD: [facility]   Chief Complaint/Reason for Referral: Hospital visit follow-up***  History of Present Illness:    Crystal Boyer is a 60 y.o. female with a history of Asthma, Depression, DM, Gout, Hypertension*** who is seen today for follow-up after her ED visit 04/30/2020. Since then she presented to the ED 05/19/2020 due to adverse effect of drug and on 06/26/2018 due to headache, SOB, and vertigo.***  She denies any chest pain, shortness of breath, palpitations, or exertional symptoms. No headaches, lightheadedness, or syncope to report. Also has no lower extremity edema, orthopnea or PND.   Past Medical History:  Diagnosis Date   Allergy    Asthma    Depression    Diabetes mellitus    Gout 12 days ago   Hypertension     Past Surgical History:  Procedure Laterality Date   ABDOMINAL HYSTERECTOMY     ANKLE FRACTURE SURGERY     right ankle   CESAREAN SECTION     IR RADIOLOGIST EVAL & MGMT  06/19/2020    Current Medications: No outpatient medications have been marked as taking for the 06/26/20 encounter (Appointment) with Parke Poisson, MD.     Allergies:   Eggs or egg-derived products, Actos [pioglitazone], Brilinta [ticagrelor], Glipizide, and Tramadol   Social History   Tobacco Use   Smoking status: Never Smoker   Smokeless tobacco: Never Used  Vaping Use   Vaping Use: Never used  Substance Use Topics   Alcohol use: No    Alcohol/week: 0.0 standard drinks    Comment: social   Drug use: Yes    Types: Marijuana    Comment: 1x monthly     Family History: The patient's family history includes Diabetes in her father and mother; Hypertension in her father and mother. There is no history of Colon cancer.  ROS:   Please see the history  of present illness.    (+) All other systems reviewed and are negative.  EKGs/Labs/Other Studies Reviewed:    The following studies were reviewed today:  Transcranial Doppler 05/01/2020: No HITS at rest or during Valsalva. Negative transcranial Doppler Bubble  study with no evidence of right to left intracardiac communication.    A vascular evaluation was performed. The left middle cerebral artery was  studied. An IV was inserted into the patient's left forearm. Verbal  informed consent was obtained.    Negative TCD Bubble study   Echo 05/01/2020: 1. Left ventricular ejection fraction, by estimation, is 55 to 60%. The  left ventricle has normal function. The left ventricle has no regional  wall motion abnormalities. Left ventricular diastolic parameters are  indeterminate.  2. Right ventricular systolic function is normal. The right ventricular  size is normal. Tricuspid regurgitation signal is inadequate for assessing  PA pressure.  3. The mitral valve is normal in structure. No evidence of mitral valve  regurgitation. No evidence of mitral stenosis.  4. The aortic valve is normal in structure. Aortic valve regurgitation is  not visualized. Mild aortic valve sclerosis is present, with no evidence  of aortic valve stenosis.  5. There is mild dilatation of the ascending aorta, measuring 37 mm.  6. The inferior vena cava is normal in size with greater than 50%  respiratory variability, suggesting right atrial  pressure of 3 mmHg.   EKG:   06/26/2020: ***  I have independently reviewed the images from ***.  Recent Labs: 04/30/2020: TSH 1.872 05/01/2020: Magnesium 1.6 05/19/2020: ALT 29 06/25/2020: B Natriuretic Peptide 16.8; BUN 28; Creatinine, Ser 1.12; Hemoglobin 13.2; Platelets 291; Potassium 4.5; Sodium 137  Recent Lipid Panel    Component Value Date/Time   CHOL 185 05/01/2020 0349   TRIG 155 (H) 05/01/2020 0349   HDL 26 (L) 05/01/2020 0349   CHOLHDL 7.1 05/01/2020  0349   VLDL 31 05/01/2020 0349   LDLCALC 128 (H) 05/01/2020 0349    Physical Exam:    VS:  There were no vitals taken for this visit.    Wt Readings from Last 5 Encounters:  06/03/20 214 lb (97.1 kg)  05/01/20 208 lb 4.8 oz (94.5 kg)  10/17/18 235 lb 8 oz (106.8 kg)  07/03/18 233 lb 11 oz (106 kg)  07/02/18 233 lb (105.7 kg)    Constitutional: No acute distress Eyes: sclera non-icteric, normal conjunctiva and lids ENMT: normal dentition, moist mucous membranes Cardiovascular: regular rhythm, normal rate, no murmurs. S1 and S2 normal. Radial pulses normal bilaterally. No jugular venous distention.  Respiratory: clear to auscultation bilaterally GI : normal bowel sounds, soft and nontender. No distention.   MSK: extremities warm, well perfused. No edema.  NEURO: grossly nonfocal exam, moves all extremities. PSYCH: alert and oriented x 3, normal mood and affect.   ASSESSMENT:    No diagnosis found. PLAN:    No diagnosis found.  Total time of encounter: *** minutes total time of encounter, including *** minutes spent in face-to-face patient care on the date of this encounter. This time includes coordination of care and counseling regarding above mentioned problem list. Remainder of non-face-to-face time involved reviewing chart documents/testing relevant to the patient encounter and documentation in the medical record. I have independently reviewed documentation from referring provider.   Weston Brass, MD, [facility] [place]   [facility]   Shared Decision Making/Informed Consent:   {Are you ordering a CV Procedure (e.g. stress test, cath, DCCV, TEE, etc)?   Press F2        :[id number]}   Medication Adjustments/Labs and Tests Ordered: Current medicines are reviewed at length with the patient today.  Concerns regarding medicines are outlined above.   No orders of the defined types were placed in this encounter.   No orders of the defined types were placed in this  encounter.   There are no Patient Instructions on file for this visit.    I,Mathew Stumpf,acting as a Neurosurgeon for Parke Poisson, MD.,have documented all relevant documentation on the behalf of Parke Poisson, MD,as directed by  Parke Poisson, MD while in the presence of Parke Poisson, MD.  ***

## 2020-06-29 ENCOUNTER — Telehealth (HOSPITAL_COMMUNITY): Payer: Self-pay

## 2020-06-29 NOTE — Telephone Encounter (Signed)
Called to schedule angiogram, no answer, phone rings busy. Will try again later. AW

## 2020-07-02 ENCOUNTER — Other Ambulatory Visit: Payer: Self-pay | Admitting: Radiology

## 2020-07-03 ENCOUNTER — Other Ambulatory Visit: Payer: Self-pay | Admitting: Radiology

## 2020-07-07 ENCOUNTER — Other Ambulatory Visit (HOSPITAL_COMMUNITY): Payer: Self-pay | Admitting: Interventional Radiology

## 2020-07-07 ENCOUNTER — Other Ambulatory Visit: Payer: Self-pay

## 2020-07-07 ENCOUNTER — Ambulatory Visit (HOSPITAL_COMMUNITY)
Admission: RE | Admit: 2020-07-07 | Discharge: 2020-07-07 | Disposition: A | Discharge status: Home / Self Care | Payer: Medicaid Other | Source: Ambulatory Visit | Attending: Interventional Radiology | Admitting: Interventional Radiology

## 2020-07-07 DIAGNOSIS — I6601 Occlusion and stenosis of right middle cerebral artery: Secondary | ICD-10-CM | POA: Insufficient documentation

## 2020-07-07 DIAGNOSIS — Z7902 Long term (current) use of antithrombotics/antiplatelets: Secondary | ICD-10-CM | POA: Diagnosis not present

## 2020-07-07 DIAGNOSIS — E119 Type 2 diabetes mellitus without complications: Secondary | ICD-10-CM | POA: Insufficient documentation

## 2020-07-07 DIAGNOSIS — I10 Essential (primary) hypertension: Secondary | ICD-10-CM | POA: Insufficient documentation

## 2020-07-07 DIAGNOSIS — Z79899 Other long term (current) drug therapy: Secondary | ICD-10-CM | POA: Insufficient documentation

## 2020-07-07 DIAGNOSIS — F32A Depression, unspecified: Secondary | ICD-10-CM | POA: Diagnosis not present

## 2020-07-07 DIAGNOSIS — Z7984 Long term (current) use of oral hypoglycemic drugs: Secondary | ICD-10-CM | POA: Insufficient documentation

## 2020-07-07 DIAGNOSIS — Z794 Long term (current) use of insulin: Secondary | ICD-10-CM | POA: Insufficient documentation

## 2020-07-07 DIAGNOSIS — Z7982 Long term (current) use of aspirin: Secondary | ICD-10-CM | POA: Diagnosis not present

## 2020-07-07 DIAGNOSIS — I6521 Occlusion and stenosis of right carotid artery: Secondary | ICD-10-CM

## 2020-07-07 HISTORY — PX: IR ANGIO VERTEBRAL SEL VERTEBRAL UNI R MOD SED: IMG5368

## 2020-07-07 HISTORY — PX: IR ANGIO INTRA EXTRACRAN SEL COM CAROTID INNOMINATE BILAT MOD SED: IMG5360

## 2020-07-07 HISTORY — PX: IR US GUIDE VASC ACCESS RIGHT: IMG2390

## 2020-07-07 LAB — BASIC METABOLIC PANEL
Anion gap: 6 (ref 5–15)
BUN: 19 mg/dL (ref 6–20)
CO2: 27 mmol/L (ref 22–32)
Calcium: 9.2 mg/dL (ref 8.9–10.3)
Chloride: 105 mmol/L (ref 98–111)
Creatinine, Ser: 0.97 mg/dL (ref 0.44–1.00)
GFR, Estimated: >60 mL/min (ref >60)
Glucose, Bld: 146 mg/dL — ABNORMAL HIGH (ref 70–99)
Potassium: 3.7 mmol/L (ref 3.5–5.1)
Sodium: 138 mmol/L (ref 135–145)

## 2020-07-07 LAB — CBC
HCT: 37.3 % (ref 36.0–46.0)
Hemoglobin: 12.1 g/dL (ref 12.0–15.0)
MCH: 28.4 pg (ref 26.0–34.0)
MCHC: 32.4 g/dL (ref 30.0–36.0)
MCV: 87.6 fL (ref 80.0–100.0)
Platelets: 255 10*3/uL (ref 150–400)
RBC: 4.26 MIL/uL (ref 3.87–5.11)
RDW: 13.4 % (ref 11.5–15.5)
WBC: 8.1 10*3/uL (ref 4.0–10.5)
nRBC: 0 % (ref 0.0–0.2)

## 2020-07-07 LAB — PROTIME-INR
INR: 0.9 (ref 0.8–1.2)
Prothrombin Time: 12.3 seconds (ref 11.4–15.2)

## 2020-07-07 LAB — GLUCOSE, CAPILLARY
Glucose-Capillary: 151 mg/dL — ABNORMAL HIGH (ref 70–99)
Glucose-Capillary: 117 mg/dL — ABNORMAL HIGH (ref 70–99)

## 2020-07-07 MED ORDER — SODIUM CHLORIDE 0.9 % IV SOLN: INTRAVENOUS | Status: AC

## 2020-07-07 MED ORDER — VERAPAMIL HCL 2.5 MG/ML IV SOLN
INTRAVENOUS | Status: AC
  Filled 2020-07-07: qty 2

## 2020-07-07 MED ORDER — NITROGLYCERIN 1 MG/10 ML FOR IR/CATH LAB
INTRA_ARTERIAL | Status: AC
  Filled 2020-07-07: qty 10

## 2020-07-07 MED ORDER — FENTANYL CITRATE (PF) 100 MCG/2ML IJ SOLN
INTRAMUSCULAR | Status: AC | PRN
  Administered 2020-07-07: 25 ug via INTRAVENOUS

## 2020-07-07 MED ORDER — NITROGLYCERIN 1 MG/10 ML FOR IR/CATH LAB
INTRA_ARTERIAL | Status: AC | PRN
  Administered 2020-07-07 (×2): 200 ug via INTRA_ARTERIAL

## 2020-07-07 MED ORDER — MIDAZOLAM HCL 2 MG/2ML IJ SOLN
INTRAMUSCULAR | Status: AC | PRN
  Administered 2020-07-07: 1 mg via INTRAVENOUS

## 2020-07-07 MED ORDER — MIDAZOLAM HCL 2 MG/2ML IJ SOLN
INTRAMUSCULAR | Status: AC
  Filled 2020-07-07: qty 2

## 2020-07-07 MED ORDER — HEPARIN SODIUM (PORCINE) 1000 UNIT/ML IJ SOLN
INTRAMUSCULAR | Status: AC
  Filled 2020-07-07: qty 1

## 2020-07-07 MED ORDER — HEPARIN SODIUM (PORCINE) 1000 UNIT/ML IJ SOLN
INTRAMUSCULAR | Status: AC | PRN
  Administered 2020-07-07: 1000 [IU] via INTRA_ARTERIAL

## 2020-07-07 MED ORDER — IOHEXOL 300 MG/ML  SOLN
100.0000 mL | Freq: Once | INTRAMUSCULAR | Status: AC | PRN
  Administered 2020-07-07: 55 mL via INTRA_ARTERIAL

## 2020-07-07 MED ORDER — LIDOCAINE HCL 1 % IJ SOLN
INTRAMUSCULAR | Status: AC
  Administered 2020-07-07: 2 mL via SUBCUTANEOUS
  Filled 2020-07-07: qty 20

## 2020-07-07 MED ORDER — FENTANYL CITRATE (PF) 100 MCG/2ML IJ SOLN
INTRAMUSCULAR | Status: AC
  Filled 2020-07-07: qty 2

## 2020-07-07 MED ORDER — ACETAMINOPHEN 325 MG PO TABS
ORAL_TABLET | ORAL | Status: AC
  Filled 2020-07-07: qty 2

## 2020-07-07 MED ORDER — SODIUM CHLORIDE 0.9 % IV SOLN: Freq: Once | INTRAVENOUS | Status: AC

## 2020-07-07 MED ORDER — ACETAMINOPHEN 325 MG PO TABS
650.0000 mg | ORAL_TABLET | Freq: Once | ORAL | Status: AC
  Administered 2020-07-07: 650 mg via ORAL

## 2020-07-07 NOTE — H&P (Signed)
Chief Complaint: Patient was seen in consultation today for right MCA stenosis/diagnostic cerebral arteriogram.  Referring Physician(s): Ihor Austin (neurology)  Supervising Physician: Julieanne Cotton  Patient Status: [facility] - Out-pt  History of Present Illness: Crystal Boyer is a 60 y.o. female with a past medical history of hypertension, CVA 04/2020, asthma, diabetes mellitus, gout, and depression. She is known to [facility] and has been followed by Dr. Corliss Skains since 06/2020. She first presented to our department at the request of Ihor Austin, NP for management of severe right MCA stenosis, thought to be cause of recent CVA. She consulted with Dr. Corliss Skains on OP basis 06/18/2020 to discuss management options. At that time, through shared decision making, patient decided to pursue a diagnostic cerebral arteriogram to further evaluated her right MCA stenosis.  Patient presents today for possible image-guided diagnostic cerebral arteriogram. Patient awake and alert laying in bed with no complaints at this time. Denies fever, chills, chest pain, dyspnea, abdominal pain, or headache.  On Plavix/Aspirin.   Past Medical History:  Diagnosis Date  . Allergy   . Asthma   . Depression   . Diabetes mellitus   . Gout 12 days ago  . Hypertension     Past Surgical History:  Procedure Laterality Date  . ABDOMINAL HYSTERECTOMY    . ANKLE FRACTURE SURGERY     right ankle  . CESAREAN SECTION    . IR RADIOLOGIST EVAL & MGMT  06/19/2020    Allergies: Eggs or egg-derived products, Actos [pioglitazone], Brilinta [ticagrelor], Glipizide, and Tramadol  Medications: Prior to Admission medications   Medication Sig Start Date End Date Taking? Authorizing Provider  amLODipine (NORVASC) 2.5 MG tablet Take 1 tablet (2.5 mg total) by mouth daily. 06/03/20  Yes Ihor Austin, NP  aspirin EC 81 MG EC tablet Take 1 tablet (81 mg total) by mouth daily. Swallow whole. 05/02/20  Yes Dellia Cloud,  MD  atorvastatin (LIPITOR) 80 MG tablet Take 1 tablet (80 mg total) by mouth daily. 05/02/20 07/31/20 Yes Dellia Cloud, MD  clopidogrel (PLAVIX) 75 MG tablet Take 1 tablet (75 mg total) by mouth daily. 06/03/20  Yes McCue, Shanda Bumps, NP  LANTUS SOLOSTAR 100 UNIT/ML Solostar Pen Inject 30 Units into the skin at bedtime. Patient taking differently: Inject 50 Units into the skin at bedtime. 05/01/20  Yes Dellia Cloud, MD  losartan (COZAAR) 50 MG tablet Take 50 mg by mouth daily.   Yes [provider]  meclizine (ANTIVERT) 25 MG tablet Take 1 tablet (25 mg total) by mouth 3 (three) times daily as needed for dizziness. 06/25/20  Yes Harris, Abigail, PA-C  metFORMIN (GLUCOPHAGE) 500 MG tablet Take 500 mg by mouth daily with breakfast.   Yes [provider]  albuterol (PROVENTIL HFA;VENTOLIN HFA) 108 (90 Base) MCG/ACT inhaler Inhale 2 puffs into the lungs every 6 (six) hours as needed for wheezing or shortness of breath. Patient not taking: Reported on 07/03/2018 04/24/18 01/13/19  Arnaldo Natal, MD     Family History  Problem Relation Age of Onset  . Hypertension Mother   . Diabetes Mother   . Hypertension Father   . Diabetes Father   . Colon cancer Neg Hx     Social History   Socioeconomic History  . Marital status: Single    Spouse name: Not on file  . Number of children: Not on file  . Years of education: Not on file  . Highest education level: Not on file  Occupational History  . Not on  file  Tobacco Use  . Smoking status: Never Smoker  . Smokeless tobacco: Never Used  Vaping Use  . Vaping Use: Never used  Substance and Sexual Activity  . Alcohol use: No    Alcohol/week: 0.0 standard drinks    Comment: social  . Drug use: Yes    Types: Marijuana    Comment: 1x monthly  . Sexual activity: Yes    Birth control/protection: Condom  Other Topics Concern  . Not on file  Social History Narrative  . Not on file   Social Determinants of Health   Financial  Resource Strain: Not on file  Food Insecurity: Not on file  Transportation Needs: Not on file  Physical Activity: Not on file  Stress: Not on file  Social Connections: Not on file     Review of Systems: A 12 point ROS discussed and pertinent positives are indicated in the HPI above.  All other systems are negative.  Review of Systems  Constitutional: Negative for chills and fever.  Respiratory: Negative for shortness of breath and wheezing.   Cardiovascular: Negative for chest pain and palpitations.  Gastrointestinal: Negative for abdominal pain.  Neurological: Negative for headaches.  Psychiatric/Behavioral: Negative for behavioral problems and confusion.    Vital Signs: BP (!) 116/103   Pulse 73   Temp 97.7 F (36.5 C) (Oral)   Ht 5' 6.5" (1.689 m)   Wt 217 lb (98.4 kg)   SpO2 100%   BMI 34.50 kg/m   Physical Exam Vitals and nursing note reviewed.  Constitutional:      General: She is not in acute distress. Cardiovascular:     Rate and Rhythm: Normal rate and regular rhythm.     Heart sounds: Normal heart sounds. No murmur heard.   Pulmonary:     Effort: Pulmonary effort is normal. No respiratory distress.     Breath sounds: Normal breath sounds. No wheezing.  Skin:    General: Skin is warm and dry.  Neurological:     Mental Status: She is alert and oriented to person, place, and time.      MD Evaluation Airway: WNL Heart: WNL Abdomen: WNL Chest/ Lungs: WNL ASA  Classification: 3 Mallampati/Airway Score: Two   Imaging: MR ANGIO HEAD WO CONTRAST  Result Date: 06/25/2020 CLINICAL DATA:  Weakness and headache EXAM: MRI HEAD WITHOUT CONTRAST MRA HEAD WITHOUT CONTRAST MRA OF THE NECK WITHOUT AND WITH CONTRAST TECHNIQUE: Multiplanar, multi-echo pulse sequences of the brain and surrounding structures were acquired without intravenous contrast. Angiographic images of the Circle of Willis were acquired using MRA technique without intravenous contrast.  Angiographic images of the neck were acquired using MRA technique without and with intravenous contrast. Carotid stenosis measurements (when applicable) are obtained utilizing NASCET criteria, using the distal internal carotid diameter as the denominator. CONTRAST:  10mL GADAVIST GADOBUTROL 1 MMOL/ML IV SOLN COMPARISON:  05/19/2020 FINDINGS: MR HEAD FINDINGS Brain: Decreased diffusion abnormality in the right frontal lobe. No acute or chronic hemorrhage. There is multifocal hyperintense T2-weighted signal within the white matter. Parenchymal volume and CSF spaces are normal. The midline structures are normal. Vascular: Major flow voids are preserved. Skull and upper cervical spine: Normal calvarium and skull base. Visualized upper cervical spine and soft tissues are normal. Sinuses/Orbits:No paranasal sinus fluid levels or advanced mucosal thickening. No mastoid or middle ear effusion. Normal orbits. MRA HEAD FINDINGS POSTERIOR CIRCULATION: --Vertebral arteries: Normal --Inferior cerebellar arteries: Normal. --Basilar artery: Normal. --Superior cerebellar arteries: Normal. --Posterior cerebral arteries: Normal. ANTERIOR CIRCULATION: --  Intracranial internal carotid arteries: Normal. --Anterior cerebral arteries (ACA): Normal. --Middle cerebral arteries (MCA): Normal. ANATOMIC VARIANTS: None MRA NECK FINDINGS Aortic arch: Normal Right carotid system: Normal Left carotid system: Normal Vertebral arteries: Codominant and normal Other: None. IMPRESSION: 1. Expected evolution of signal changes at the site of recent infarct of the right frontal lobe. No hemorrhage or mass effect. 2. Normal MRA of the head and neck. Electronically Signed   By: Deatra Mehring M.D.   On: 06/25/2020 22:16   MR Angiogram Neck W or Wo Contrast  Result Date: 06/25/2020 CLINICAL DATA:  Weakness and headache EXAM: MRI HEAD WITHOUT CONTRAST MRA HEAD WITHOUT CONTRAST MRA OF THE NECK WITHOUT AND WITH CONTRAST TECHNIQUE: Multiplanar, multi-echo  pulse sequences of the brain and surrounding structures were acquired without intravenous contrast. Angiographic images of the Circle of Willis were acquired using MRA technique without intravenous contrast. Angiographic images of the neck were acquired using MRA technique without and with intravenous contrast. Carotid stenosis measurements (when applicable) are obtained utilizing NASCET criteria, using the distal internal carotid diameter as the denominator. CONTRAST:  10mL GADAVIST GADOBUTROL 1 MMOL/ML IV SOLN COMPARISON:  05/19/2020 FINDINGS: MR HEAD FINDINGS Brain: Decreased diffusion abnormality in the right frontal lobe. No acute or chronic hemorrhage. There is multifocal hyperintense T2-weighted signal within the white matter. Parenchymal volume and CSF spaces are normal. The midline structures are normal. Vascular: Major flow voids are preserved. Skull and upper cervical spine: Normal calvarium and skull base. Visualized upper cervical spine and soft tissues are normal. Sinuses/Orbits:No paranasal sinus fluid levels or advanced mucosal thickening. No mastoid or middle ear effusion. Normal orbits. MRA HEAD FINDINGS POSTERIOR CIRCULATION: --Vertebral arteries: Normal --Inferior cerebellar arteries: Normal. --Basilar artery: Normal. --Superior cerebellar arteries: Normal. --Posterior cerebral arteries: Normal. ANTERIOR CIRCULATION: --Intracranial internal carotid arteries: Normal. --Anterior cerebral arteries (ACA): Normal. --Middle cerebral arteries (MCA): Normal. ANATOMIC VARIANTS: None MRA NECK FINDINGS Aortic arch: Normal Right carotid system: Normal Left carotid system: Normal Vertebral arteries: Codominant and normal Other: None. IMPRESSION: 1. Expected evolution of signal changes at the site of recent infarct of the right frontal lobe. No hemorrhage or mass effect. 2. Normal MRA of the head and neck. Electronically Signed   By: Deatra Artiga M.D.   On: 06/25/2020 22:16   MR BRAIN WO CONTRAST  Result  Date: 06/25/2020 CLINICAL DATA:  Weakness and headache EXAM: MRI HEAD WITHOUT CONTRAST MRA HEAD WITHOUT CONTRAST MRA OF THE NECK WITHOUT AND WITH CONTRAST TECHNIQUE: Multiplanar, multi-echo pulse sequences of the brain and surrounding structures were acquired without intravenous contrast. Angiographic images of the Circle of Willis were acquired using MRA technique without intravenous contrast. Angiographic images of the neck were acquired using MRA technique without and with intravenous contrast. Carotid stenosis measurements (when applicable) are obtained utilizing NASCET criteria, using the distal internal carotid diameter as the denominator. CONTRAST:  10mL GADAVIST GADOBUTROL 1 MMOL/ML IV SOLN COMPARISON:  05/19/2020 FINDINGS: MR HEAD FINDINGS Brain: Decreased diffusion abnormality in the right frontal lobe. No acute or chronic hemorrhage. There is multifocal hyperintense T2-weighted signal within the white matter. Parenchymal volume and CSF spaces are normal. The midline structures are normal. Vascular: Major flow voids are preserved. Skull and upper cervical spine: Normal calvarium and skull base. Visualized upper cervical spine and soft tissues are normal. Sinuses/Orbits:No paranasal sinus fluid levels or advanced mucosal thickening. No mastoid or middle ear effusion. Normal orbits. MRA HEAD FINDINGS POSTERIOR CIRCULATION: --Vertebral arteries: Normal --Inferior cerebellar arteries: Normal. --Basilar artery: Normal. --Superior cerebellar arteries:  Normal. --Posterior cerebral arteries: Normal. ANTERIOR CIRCULATION: --Intracranial internal carotid arteries: Normal. --Anterior cerebral arteries (ACA): Normal. --Middle cerebral arteries (MCA): Normal. ANATOMIC VARIANTS: None MRA NECK FINDINGS Aortic arch: Normal Right carotid system: Normal Left carotid system: Normal Vertebral arteries: Codominant and normal Other: None. IMPRESSION: 1. Expected evolution of signal changes at the site of recent infarct of the  right frontal lobe. No hemorrhage or mass effect. 2. Normal MRA of the head and neck. Electronically Signed   By: Deatra RobinsonKevin  Herman M.D.   On: 06/25/2020 22:16   DG Chest Port 1 View  Result Date: 06/25/2020 CLINICAL DATA:  Shortness of breath and chest pain. EXAM: PORTABLE CHEST 1 VIEW COMPARISON:  May 19, 2020 FINDINGS: The lungs are hyperinflated. Mild linear scarring and/or atelectasis is again seen within the left lung base. There is no evidence of acute infiltrate, pleural effusion or pneumothorax. The heart size and mediastinal contours are within normal limits. There is mild calcification of the aortic arch. The visualized skeletal structures are unremarkable. IMPRESSION: Stable exam without acute or active cardiopulmonary disease. Electronically Signed   By: Aram Candelahaddeus  Houston M.D.   On: 06/25/2020 18:53   IR Radiologist Eval & Mgmt  Result Date: 06/19/2020 EXAM: NEW PATIENT OFFICE VISIT CHIEF COMPLAINT: Headaches.  Left-sided weakness.  Memory problems. Current Pain Level: 1-10 HISTORY OF PRESENT ILLNESS: 13Fifty-seven year old right handed female who presented via EMS on 04/30/2020 with left-sided facial droop and left-sided weakness with dysarthria. The patient was evaluated by neurology and workup revealed restricted diffusion consistent with ischemia involving the right centrum semiovale and right corona radiata consistent with acute to subacute infarcts in the watershed distribution of the right middle cerebral artery. CT angiogram at that time also revealed possible severe pre occlusive stenosis of the right middle cerebral artery proximal M2 superior division. Patient returns today accompanied by her daughter to discuss the intracranial stenosis, and schedule a diagnostic catheter arteriogram. Clinically, the patient reports significant improvement except for dysarthria resulting in mild difficulty with liquids and less with solids, and also with intermittent confusion, comprising confusion with  dates and times. Otherwise, the patient is fully functional independently at home. She also complains of an occipital headache which then radiates more bifrontally to involving most of the head. The headaches are usually proceeded by dizziness progressing to a 10/10 headache diffusely which is pulsatile and associated with photophobia. The patient reports improvement with being still and laying in a dark room. During these times, the patient reports no other symptoms except feeling very tired once the pain has dissipated. No history of visual blurring, diplopia, amaurosis fugax. PMH: Past Medical History: Diagnosis * : Date . * : Allergy * : . * : Asthma * : . * : Depression * : . * : Diabetes mellitus * : . * : Gout * : 12 days ago . * : Hypertension * : PSH: Past Surgical History: Procedure * : Laterality * : Date . * : ABDOMINAL HYSTERECTOMY * : * : . * : ANKLE FRACTURE SURGERY * : * : * : right ankle . * : CESAREAN SECTION * : * : Social History: Socioeconomic History . * : Marital status: * : Single * : * : Spouse name: * : Not on file . * : Number of children: * : Not on file . * : Years of education: * : Not on file . * : Highest education level: * : Not on file Occupational History . * :  Not on file Tobacco Use . * : Smoking status: * : Never Smoker . * : Smokeless tobacco: * : Never Used Vaping Use . * : Vaping Use: * : Never used Substance and Sexual Activity . * : Alcohol use: * : No * : * : Alcohol/week: * : 0.0 standard drinks * : * : Comment: social . * : Drug use: * : Yes * : * : Types: * : Marijuana * : * : Comment: 1x monthly . * : Sexual activity: * : Yes * : * : Birth control/protection: * : Condom Other Topics * : Concern . * : Not on file Social History Narrative . * : Not on file Food Insecurity: Not on file Transportation Needs: Not on file Physical Activity: Not on file Stress: Not on file Social Connections: Not on file Intimate Partner Violence: Not on file Family History: Family History  Problem * : Relation * : Age of Onset . * : Hypertension * : Mother * : . * : Diabetes * : Mother * : . * : Hypertension * : Father * : . * : Diabetes * : Father * : . * : Colon cancer * : Neg Hx * : Medications: Current Outpatient Medications on File Prior to Visit Medication * : Sig * : Dispense * : Refill . * : aspirin EC 81 MG EC tablet * : Take 1 tablet (81 mg total) by mouth daily. Swallow whole. * : 30 tablet * : 11 . * : atorvastatin (LIPITOR) 80 MG tablet * : Take 1 tablet (80 mg total) by mouth daily. * : 30 tablet * : 2 . * : LANTUS SOLOSTAR 100 UNIT/ML Solostar Pen * : Inject 30 Units into the skin at bedtime. * : 15 mL * : 5 . * : losartan (COZAAR) 50 MG tablet * : Take 1 tablet (50 mg total) by mouth daily. * : 30 tablet * : 0 . * : DISCONTINUED albuterol (PROVENTIL HFA;VENTOLIN HFA) 108 (90 Base) MCG/ACT inhaler * : Inhale 2 puffs into the lungs every 6 (six) hours as needed for wheezing or shortness of breath. (Patient not taking: Reported on 07/03/2018) * : 1 Inhaler * : 1 Allergies: Allergies Allergen * : Reactions . * : Eggs Or Egg-Derived Products * : Diarrhea . * : Actos Pioglitazone * : Other (See Comments) * : * : Made the patient feel like she was "going to die" . * : Brilinta Ticagrelor * : * : * : Shortness of breath . * : Glipizide * : Other (See Comments) * : * : Made patient feel like she was "going to pass out and die" . * : Tramadol * : Rash REVIEW OF SYSTEMS: The patient reports episodes of shortness of breath with exertion. Denies any palpitations or paroxysmal nocturnal dyspnea, or of pedal edema. Denies any coughing, hemoptysis or wheezing or sputum production. She reports her weight to be steady, appetite normal. No nausea, vomiting, abdominal pain or melena. She denies any recent chills, fever or rigors. Denies any dysuria, hematuria or polyuria. PHYSICAL EXAMINATION: Patient appears in no acute distress. Mild hesitancy with speech. Otherwise, no discrete lateralizing features  noted neurologically. Coordination and station and gait appears grossly intact. ASSESSMENT AND PLAN: Patient and daughter were informed regarding the reason for diagnostic catheter arteriogram. The procedure, the reasons, and the alternatives were reviewed. They were also informed of the extremely small risk of stroke during the procedure.  Questions were answered to their satisfaction. They are agreeable to proceed with scheduling a diagnostic catheter arteriogram as soon as possible. Further recommendations as per findings of the diagnostic catheter arteriogram. Electronically Signed   By: Julieanne Cotton M.D.   On: 06/18/2020 12:54    Labs:  CBC: Recent Labs    05/01/20 0349 05/19/20 1115 06/25/20 1348 07/07/20 0636  WBC 5.1 9.0 7.0 8.1  HGB 12.7 12.5 13.2 12.1  HCT 37.8 38.6 41.4 37.3  PLT 235 226 291 255    COAGS: Recent Labs    04/30/20 1840 07/07/20 0636  INR 1.0 0.9  APTT 29  --     BMP: Recent Labs    04/30/20 1003 05/01/20 0349 05/19/20 1115 06/25/20 1348  NA 139 140 138 137  K 4.3 3.4* 4.1 4.5  CL 104 106 104 107  CO2 26 28 29 24   GLUCOSE 246* 101* 129* 195*  BUN 11 10 18  28*  CALCIUM 9.4 9.2 9.3 9.6  CREATININE 1.03* 0.91 1.05* 1.12*  GFRNONAA >60 >60 >60 57*    LIVER FUNCTION TESTS: Recent Labs    04/20/20 1000 04/30/20 1003 05/01/20 0349 05/19/20 1115  BILITOT 1.0 0.7 0.4 0.8  AST 21 23 22 28   ALT 22 21 19 29   ALKPHOS 95 96 80 110  PROT 7.5 7.4 6.6 6.8  ALBUMIN 3.3* 3.3* 2.9* 3.1*     Assessment and Plan:  Prior CVA in setting of right MCA stenosis. Plan for image-guided diagnostic cerebral arteriogram today in IR with Dr. 05/03/20. Patient is NPO. Afebrile and WBCs WNL. Ok to proceed with Plavix/Aspirin use per Dr. 07/19/20. INR 0.9 today.  Risks and benefits of diagnostic cerebral angiogram were discussed with the patient including, but not limited to bleeding, infection, vascular injury, stroke, or contrast induced renal  failure. This interventional procedure involves the use of X-rays and because of the nature of the planned procedure, it is possible that we will have prolonged use of X-ray fluoroscopy. Potential radiation risks to you include (but are not limited to) the following: - A slightly elevated risk for cancer  several years later in life. This risk is typically less than 0.5% percent. This risk is low in comparison to the normal incidence of human cancer, which is 33% for women and 50% for men according to the [employer]. - Radiation induced injury can include skin redness, resembling a rash, tissue breakdown / ulcers and hair loss (which can be temporary or permanent).  The likelihood of either of these occurring depends on the difficulty of the procedure and whether you are sensitive to radiation due to previous procedures, disease, or genetic conditions.  IF your procedure requires a prolonged use of radiation, you will be notified and given written instructions for further action.  It is your responsibility to monitor the irradiated area for the 2 weeks following the procedure and to notify your physician if you are concerned that you have suffered a radiation induced injury.   All of the patient's questions were answered, patient is agreeable to proceed. Consent signed and in chart.   Thank you for this interesting consult.  I greatly enjoyed meeting DANTE COOTER and look forward to participating in their care.  A copy of this report was sent to the requesting provider on this date.  Electronically Signed: , PA-C 07/07/2020, 7:57 AM   I spent a total of 25 Minutes in face to face in clinical consultation, greater than 50% of  which was counseling/coordinating care for right MCA stenosis/diagnostic cerebral arteriogram.

## 2020-07-07 NOTE — Discharge Instructions (Addendum)
Drink plenty of fluids for 48 hours and keep wrist elevated at heart level for 24 hours  Radial Site Care   This sheet gives you information about how to care for yourself after your procedure. Your health care provider may also give you more specific instructions. If you have problems or questions, contact your health care provider. What can I expect after the procedure? After the procedure, it is common to have:  Bruising and tenderness at the catheter insertion area. Follow these instructions at home: Medicines  Take over-the-counter and prescription medicines only as told by your health care provider. Insertion site care 1. Follow instructions from your health care provider about how to take care of your insertion site. Make sure you: ? Wash your hands with soap and water before you change your bandage (dressing). If soap and water are not available, use hand sanitizer. ? Remove your dressing as told by your health care provider. In 24 hours 2. Check your insertion site every day for signs of infection. Check for: ? Redness, swelling, or pain. ? Fluid or blood. ? Pus or a bad smell. ? Warmth. 3. Do not take baths, swim, or use a hot tub until your health care provider approves. 4. You may shower 24-48 hours after the procedure, or as directed by your health care provider. ? Remove the dressing and gently wash the site with plain soap and water. ? Pat the area dry with a clean towel. ? Do not rub the site. That could cause bleeding. 5. Do not apply powder or lotion to the site. Activity   1. For 24 hours after the procedure, or as directed by your health care provider: ? Do not flex or bend the affected arm. ? Do not push or pull heavy objects with the affected arm. ? Do not drive yourself home from the hospital or clinic. You may drive 24 hours after the procedure unless your health care provider tells you not to. ? Do not operate machinery or power tools. 2. Do not lift  anything that is heavier than 10 lb (4.5 kg), or the limit that you are told, until your health care provider says that it is safe.  For 4 days 3. Ask your health care provider when it is okay to: ? Return to work or school. ? Resume usual physical activities or sports. ? Resume sexual activity. General instructions  If the catheter site starts to bleed, raise your arm and put firm pressure on the site. If the bleeding does not stop, get help right away. This is a medical emergency.  If you went home on the same day as your procedure, a responsible adult should be with you for the first 24 hours after you arrive home.  Keep all follow-up visits as told by your health care provider. This is important. Contact a health care provider if:  You have a fever.  You have redness, swelling, or yellow drainage around your insertion site. Get help right away if:  You have unusual pain at the radial site.  The catheter insertion area swells very fast.  The insertion area is bleeding, and the bleeding does not stop when you hold steady pressure on the area.  Your arm or hand becomes pale, cool, tingly, or numb. These symptoms may represent a serious problem that is an emergency. Do not wait to see if the symptoms will go away. Get medical help right away. Call your local emergency services (911 in the [place] Do   not drive yourself to the hospital. Summary  After the procedure, it is common to have bruising and tenderness at the site.  Follow instructions from your health care provider about how to take care of your radial site wound. Check the wound every day for signs of infection.  Do not lift anything that is heavier than 10 lb (4.5 kg), or the limit that you are told, until your health care provider says that it is safe. This information is not intended to replace advice given to you by your health care provider. Make sure you discuss any questions you have with your health care  provider. Document Revised: 03/01/2017 Document Reviewed: 03/01/2017 Elsevier Patient Education  2020 [employer]   Moderate Conscious Sedation, Adult, Care After This sheet gives you information about how to care for yourself after your procedure. Your health care provider may also give you more specific instructions. If you have problems or questions, contact your health care provider. What can I expect after the procedure? After the procedure, it is common to have:  Sleepiness for several hours.  Impaired judgment for several hours.  Difficulty with balance.  Vomiting if you eat too soon. Follow these instructions at home: For the time period you were told by your health care provider: 24 hours  Rest.  Do not participate in activities where you could fall or become injured.  Do not drive or use machinery.  Do not drink alcohol.  Do not take sleeping pills or medicines that cause drowsiness.  Do not make important decisions or sign legal documents.  Do not take care of children on your own.      Eating and drinking  Follow the diet recommended by your health care provider.  Drink enough fluid to keep your urine pale yellow.  If you vomit: ? Drink water, juice, or soup when you can drink without vomiting. ? Make sure you have little or no nausea before eating solid foods.   General instructions  Take over-the-counter and prescription medicines only as told by your health care provider.  Have a responsible adult stay with you for the time you are told. It is important to have someone help care for you until you are awake and alert.  Do not smoke.  Keep all follow-up visits as told by your health care provider. This is important. Contact a health care provider if:  You are still sleepy or having trouble with balance after 24 hours.  You feel light-headed.  You keep feeling nauseous or you keep vomiting.  You develop a rash.  You have a fever.  You  have redness or swelling around the IV site. Get help right away if:  You have trouble breathing.  You have new-onset confusion at home. Summary  After the procedure, it is common to feel sleepy, have impaired judgment, or feel nauseous if you eat too soon.  Rest after you get home. Know the things you should not do after the procedure.  Follow the diet recommended by your health care provider and drink enough fluid to keep your urine pale yellow.  Get help right away if you have trouble breathing or new-onset confusion at home. This information is not intended to replace advice given to you by your health care provider. Make sure you discuss any questions you have with your health care provider. Document Revised: 05/24/2019 Document Reviewed: 12/20/2018 Elsevier Patient Education  2021 [employer].

## 2020-07-07 NOTE — Procedures (Addendum)
S/P bilateral commonvarotid and RT vert angiograms. Rt rad approach. Findings. 1.Severe stenosis prox inferior division RT MCA  M2 seg and occluded distal M3 M4 mid perisylvian branch. S.Bettina Warn MD

## 2020-07-07 NOTE — Sedation Documentation (Signed)
Patient transported to PACU. Paige RN at the bedside, wrist assessed. TR band in place. No drainage noted from dressing. Pulses intact via palpation.

## 2020-07-13 ENCOUNTER — Other Ambulatory Visit (HOSPITAL_COMMUNITY): Payer: Self-pay | Admitting: Interventional Radiology

## 2020-07-13 DIAGNOSIS — I6521 Occlusion and stenosis of right carotid artery: Secondary | ICD-10-CM

## 2020-07-21 ENCOUNTER — Ambulatory Visit (HOSPITAL_COMMUNITY): Admission: RE | Admit: 2020-07-21 | Payer: Medicaid Other | Source: Ambulatory Visit

## 2020-07-22 ENCOUNTER — Telehealth (HOSPITAL_COMMUNITY): Payer: Self-pay

## 2020-07-22 NOTE — Telephone Encounter (Signed)
Pt will call back to reschedule her consult with Dr. Corliss Skains. AW

## 2020-08-03 ENCOUNTER — Encounter: Payer: Self-pay | Admitting: Adult Health

## 2020-08-03 ENCOUNTER — Telehealth: Payer: Self-pay | Admitting: Adult Health

## 2020-08-06 ENCOUNTER — Other Ambulatory Visit: Payer: Self-pay | Admitting: Family Medicine

## 2020-08-06 DIAGNOSIS — Z1231 Encounter for screening mammogram for malignant neoplasm of breast: Secondary | ICD-10-CM

## 2020-09-02 ENCOUNTER — Encounter: Payer: Self-pay | Admitting: *Deleted

## 2020-09-02 ENCOUNTER — Encounter: Payer: Self-pay | Admitting: Adult Health

## 2020-09-02 ENCOUNTER — Ambulatory Visit (INDEPENDENT_AMBULATORY_CARE_PROVIDER_SITE_OTHER): Payer: Medicaid Other | Admitting: Adult Health

## 2020-09-02 ENCOUNTER — Telehealth: Payer: Self-pay | Admitting: Adult Health

## 2020-09-02 ENCOUNTER — Telehealth (HOSPITAL_COMMUNITY): Payer: Self-pay

## 2020-09-02 VITALS — BP 155/93 | HR 68 | Ht 66.5 in | Wt 223.4 lb

## 2020-09-02 DIAGNOSIS — F418 Other specified anxiety disorders: Secondary | ICD-10-CM

## 2020-09-02 DIAGNOSIS — R42 Dizziness and giddiness: Secondary | ICD-10-CM

## 2020-09-02 DIAGNOSIS — I10 Essential (primary) hypertension: Secondary | ICD-10-CM

## 2020-09-02 DIAGNOSIS — I69319 Unspecified symptoms and signs involving cognitive functions following cerebral infarction: Secondary | ICD-10-CM

## 2020-09-02 DIAGNOSIS — I69398 Other sequelae of cerebral infarction: Secondary | ICD-10-CM

## 2020-09-02 DIAGNOSIS — G44209 Tension-type headache, unspecified, not intractable: Secondary | ICD-10-CM

## 2020-09-02 DIAGNOSIS — I639 Cerebral infarction, unspecified: Secondary | ICD-10-CM | POA: Diagnosis not present

## 2020-09-02 DIAGNOSIS — I6521 Occlusion and stenosis of right carotid artery: Secondary | ICD-10-CM

## 2020-09-02 MED ORDER — ATORVASTATIN CALCIUM 80 MG PO TABS: 80.0000 mg | ORAL_TABLET | Freq: Every day | ORAL | 3 refills | Status: DC

## 2020-09-02 MED ORDER — AMITRIPTYLINE HCL 25 MG PO TABS: 25.0000 mg | ORAL_TABLET | Freq: Every day | ORAL | 5 refills | Status: DC

## 2020-09-02 NOTE — Patient Instructions (Addendum)
Start amitriptyline 25mg  nightly for anxiety and headaches  Referral placed to [facility] - you will be contacted to schedule initial evaluation  Referral placed to physical and speech therapy at [facility] - please call them by Friday to schedule initial evaluations  Continue aspirin 81 mg daily  and atorvastatin for secondary stroke prevention  Continue to follow up with PCP regarding cholesterol, blood pressure and diabetes management  Maintain strict control of hypertension with blood pressure goal below 130/90, diabetes with hemoglobin A1c goal below 7% and cholesterol with LDL cholesterol (bad cholesterol) goal below 70 mg/dL.      Followup in the future with me in 4 months or call earlier if needed     Thank you for coming to see Saturday at [facility]. I hope we have been able to provide you high quality care today.  You may receive a patient satisfaction survey over the next few weeks. We would appreciate your feedback and comments so that we may continue to improve ourselves and the health of our patients.

## 2020-09-02 NOTE — Progress Notes (Signed)
[facility] [address] [place]. [place] [address] [phone]       HOSPITAL FOLLOW UP NOTE  [name] Date of Birth:  24-Apr-1960 Medical Record Number:  [id number]   Reason for Referral:  hospital stroke follow up    SUBJECTIVE:   CHIEF COMPLAINT:  Chief Complaint  Patient presents with   Ischemic stroke    Rm 3, 3 month FU  "have run out of some medicines; my memory isn't good"     HPI:   Today, 09/02/2020, Crystal Boyer returns for 15-month stroke follow-up unaccompanied.  Since prior visit, she presented to ED on 5/19 for posterior headache with severe nausea and vertigo with MRI brain and MRA head/neck unremarkable for acute findings.  She has since been stable from stroke standpoint without new stroke/TIA symptoms.  Reports residual short-term memory concerns, vertigo, and daily headaches (bifrontal pressure sensation) as well as intermittent slurred speech. She does endorse increased stress levels as well as depression/anxiety - was previously on Celexa but since stopped as she ran out of it.  She is currently unemployed previously working at a daycare initially reporting that she was fired after she told them about her stroke and memory loss but then later reported that she quit any after she confronted another employee regarding her disciplining methods.  She is able to maintain ADLs and IADLs independently but has more difficulty with short-term memory such as remembering appointments.  MMSE today 27/30.  She also endorses frustration regarding her family and being "disowned" after having her stroke. She underwent cerebral angiogram by Dr. Corliss Skains on 5/31 (see below). Has not yet had follow up with Dr. Corliss Skains regarding results. Remains on aspirin without associated side effects.  Recently ran out of atorvastatin as well as other multiple medications. Blood pressure today 155/93 -not routinely monitored at home.  She recently saw her PCP in  June and reports PCP dx'd with anemia and low iron - since started iron supplements.  No further concerns at this time.     Diagnostic imaging:  Cerebral angiogram 07/07/2020 IMPRESSION: Approximately 50% stenosis of the right middle cerebral artery distal M1 segment.   Severe segmental stenosis of a branch of the inferior division of the right middle cerebral artery in the M2 region.   Occlusion of distal mid perisylvian branch in the M3 M4 region.   Moderate to moderately severe focal area of narrowing of a branch of the inferior division of the left middle cerebral artery in the M3 M4 region.   MR BRAIN MRA head/neck 06/25/2020 IMPRESSION: 1. Expected evolution of signal changes at the site of recent infarct of the right frontal lobe. No hemorrhage or mass effect. 2. Normal MRA of the head and neck.     History provided for reference purposes only Initial visit 06/03/2020 JM: Crystal Boyer is being seen for hospital follow-up accompanied by her daughter  Since discharge, she does report slowly recovering although continues to experience occasional slurred speech, drooling, short-term memory difficulties, blurred vision and headache. She was evaluated in the ED on 4/12 for concerns of increased dyspnea after her first dose of Brilinta the night prior (she was not aware that this needed to be started prior) as well as 1 week visual changes, dizziness and headache.  MRI negative for acute findings and recommended discontinuing Brilinta and initiate Plavix in addition to aspirin.  She apparently has not started taking Plavix but has remained on aspirin without side effects.  Remains on  atorvastatin 80 mg daily without myalgias.  Blood pressure today elevated at 178/94 - per patient, she has had great difficulty managing blood pressure even prior to her stroke.  She is not currently monitoring at home.  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  Frontal  headaches and reported blurred vision typically occur mid to later afternoon.  She has not yet been seen by ophthalmology.  She has not had follow-up with IR Dr. Corliss Skains as advised at discharge.  She denies receiving cardiac monitor although per review of epic, this was sent to her approximately 1 month ago.  No further concerns at this time.  Stroke admission 04/30/2020 Crystal Boyer is a 60 yo female who presented on 04/30/2020 by EMS after seeing her PCP for left facial droop that began 5 days ago. She had a short period of dysarthria on the day symptoms were first experienced that quickly resolved. Has had no aphasia. No history of stroke. On examination, her positive findings are left facial droop,  + finger roll test on the left, subtle LUE weakness and left sided extinction to DSS.   Personally reviewed hospitalization pertinent progress notes, lab work and imaging with summary provided.  Evaluated by Dr. Roda Shutters with stroke work-up revealing foci of restricted diffusion within the right centrum semiovale and corona radiata, consistent with acute/subacute infarcts in a watershed distribution likely in setting of large vessel disease from right M2 occlusion or near occlusion given risk factors and multifocal intracranial stenosis, R M2 occlusion/near occlusion likely due to arthrosclerosis however cardioembolic source cannot be completely ruled out.  Recommended follow-up outpatient IR for possible intervention/stent placement as well as 30-day cardiac event monitoring to rule out A. fib.  Recommended aspirin and Brilinta for 30 days then aspirin alone.  HTN stable.  LDL 128 and initiate atorvastatin 80 mg daily.  Uncontrolled DM with A1c 8.2.  Evaluated by therapies with recommended OP PT and discharged home in stable condition.  Stroke: Foci of restricted diffusion within the right centrum semiovale and corona radiata, consistent with acute/subacute infarcts in a watershed distribution  likely  secondary to with severe, near occlusive stenosis versus near occlusive thrombus superior  division proximal M2 right MCA branch vessel although embolic source cannot be completely ruled out   Code Stroke CT head No acute abnormality.  Redemonstrated patchy and ill-defined hypoattenuation within the bilateral cerebral white matter, which is overall moderate in severity but advanced for age. Findings are nonspecific, but most often secondary to chronic small vessel ischemia. Mild bilateral maxillary sinus mucosal thickening.   CTA head & neck  Unchanged short-segment apparent flow gap within a superior  division proximal M2 right MCA branch vessel: severe, near occlusive stenosis versus near occlusive thrombus. Mild stenosis of the distal M1 right middle cerebral artery. Calcified plaque within the intracranial ICAs with mild/moderate stenosis of the paraclinoid segments bilaterally. Mild atherosclerotic narrowing of the V4 right vertebral artery.   MRI : Foci of restricted diffusion within the right centrum semiovale and corona radiata, consistent with acute/subacute infarcts in a watershed distribution.   TCD bubble study: Negative   2D Echo: EF 55-60%, No thrombus, wall motion abnormality or shunt found.   LDL 128 HgbA1c 8.2 Therapy recommendations:  Cleared for home  Disposition:  Home with home health PT, planning to DC today 30 day cardiac monitoring request sent to cards master Plan is for ASA 81mg  daily and Brillinta 90mg  BID for 30 days then ASA 81  mg alone.       ROS:   14 system review of systems performed and negative with exception of those listed in HPI  PMH:  Past Medical History:  Diagnosis Date   Allergy    Anemia    Asthma    Depression    Diabetes mellitus    Gout 12 days ago   Hypertension     PSH:  Past Surgical History:  Procedure Laterality Date   ABDOMINAL HYSTERECTOMY     ANKLE FRACTURE SURGERY     right ankle   CESAREAN SECTION     IR ANGIO  INTRA EXTRACRAN SEL COM CAROTID INNOMINATE BILAT MOD SED  07/07/2020   IR ANGIO VERTEBRAL SEL VERTEBRAL UNI R MOD SED  07/07/2020   IR RADIOLOGIST EVAL & MGMT  06/19/2020   IR US GUIDE VASC ACCESS RIGHT  07/07/2020    Social History:  Social History   Socioeconomic History   Marital status: Single    Spouse name: Not on file   Number of children: 3   Years of education: Not on file   Highest education level: Not on file  Occupational History    Comment: teacher  Tobacco Use   Smoking status: Never   Smokeless tobacco: Never  Vaping Use   Vaping Use: Never used  Substance and Sexual Activity   Alcohol use: No    Alcohol/week: 0.0 standard drinks    Comment: social   Drug use: Yes    Types: Marijuana    Comment: 1x monthly   Sexual activity: Yes    Birth control/protection: Condom  Other Topics Concern   Not on file  Social History Narrative   Lives alone   Social Determinants of Health   Financial Resource Strain: Not on file  Food Insecurity: Not on file  Transportation Needs: Not on file  Physical Activity: Not on file  Stress: Not on file  Social Connections: Not on file  Intimate Partner Violence: Not on file    Family History:  Family History  Problem Relation Age of Onset   Hypertension Mother    Diabetes Mother    Hypertension Father    Diabetes Father    Colon cancer Neg Hx     Medications:   Current Outpatient Medications on File Prior to Visit  Medication Sig Dispense Refill   amLODipine (NORVASC) 2.5 MG tablet Take 1 tablet (2.5 mg total) by mouth daily. 30 tablet 4   aspirin EC 81 MG EC tablet Take 1 tablet (81 mg total) by mouth daily. Swallow whole. 30 tablet 11   atorvastatin (LIPITOR) 80 MG tablet Take 1 tablet (80 mg total) by mouth daily. 30 tablet 2   clopidogrel (PLAVIX) 75 MG tablet Take 1 tablet (75 mg total) by mouth daily. 90 tablet 0   ferrous sulfate 324 MG TBEC Take 324 mg by mouth.     LANTUS SOLOSTAR 100 UNIT/ML Solostar Pen  Inject 30 Units into the skin at bedtime. (Patient taking differently: Inject 50 Units into the skin at bedtime.) 15 mL 5   losartan (COZAAR) 50 MG tablet Take 50 mg by mouth daily.     meclizine (ANTIVERT) 25 MG tablet Take 1 tablet (25 mg total) by mouth 3 (three) times daily as needed for dizziness. 30 tablet 0   [DISCONTINUED] albuterol (PROVENTIL HFA;VENTOLIN HFA) 108 (90 Base) MCG/ACT inhaler Inhale 2 puffs into the lungs every 6 (six) hours as needed for wheezing or shortness of breath. (Patient not taking:  Reported on 07/03/2018) 1 Inhaler 1   No current facility-administered medications on file prior to visit.    Allergies:   Allergies  Allergen Reactions   Eggs Or Egg-Derived Products Diarrhea   Actos [Pioglitazone] Other (See Comments)    Made the patient feel like she was "going to die"   Brilinta [Ticagrelor]     Shortness of breath    Glipizide Other (See Comments)    Made patient feel like she was "going to pass out and die"   Tramadol Rash      OBJECTIVE:  Physical Exam  Vitals:   09/02/20 0954  BP: (!) 155/93  Pulse: 68  Weight: 223 lb 6.4 oz (101.3 kg)  Height: 5' 6.5" (1.689 m)    Body mass index is 35.52 kg/m. No results found.  General: well developed, well nourished, very pleasant middle-age African-American female, seated, in no evident distress Head: head normocephalic and atraumatic.   Neck: supple with no carotid or supraclavicular bruits Cardiovascular: regular rate and rhythm, no murmurs Musculoskeletal: no deformity Skin:  no rash/petichiae Vascular:  Normal pulses all extremities   Neurologic Exam Mental Status: Awake and fully alert.  Fluent speech and language.  Oriented to place and time. Recent memory subjectively impaired and remote memory intact. Attention span, concentration and fund of knowledge appropriate during visit. Mood and affect appropriate.  Cranial Nerves: Pupils equal, briskly reactive to light. Extraocular movements  full without nystagmus. Visual fields full to confrontation. Hearing intact. Facial sensation intact.  Slight left lower facial weakness when smiling. Tongue and palate moves normally and symmetrically.  Motor: Normal bulk and tone. Normal strength in all tested extremity muscles  Sensory.: intact to touch , pinprick , position and vibratory sensation.  Coordination: Rapid alternating movements normal in all extremities. Finger-to-nose and heel-to-shin performed accurately bilaterally.   Gait and Station: Arises from chair without difficulty. Stance is normal. Gait demonstrates normal stride length and balance without use of assistive device.  Reflexes: 1+ and symmetric. Toes downgoing.    MMSE - Mini Mental State Exam 09/02/2020  Orientation to time 5  Orientation to Place 5  Registration 3  Attention/ Calculation 3  Recall 3  Language- name 2 objects 2  Language- repeat 1  Language- follow 3 step command 3  Language- read & follow direction 1  Write a sentence 1  Copy design 0  Total score 27        ASSESSMENT: Crystal Boyer is a 60 y.o. year old female presented with 5 day onset of left facial droop and short period of dysarthria with evidence of mild LUE weakness on 04/30/2020 with stroke work-up revealing right centrum semiovale and corona radiata infarcts likely secondary to R M2 occlusion/near occlusion in setting of arthrosclerosis although embolic source cannot be completely ruled out. Vascular risk factors include HTN, HLD, DM, and diffuse intracranial stenosis.     PLAN:  R SO and CR infarct :  Residual deficit: Short-term memory loss and imbalance with vertigo -referral placed to neuro rehab PT and SLP.  Discussed importance of routine physical and mental exercises at home as well as managing stressors, managing stroke risk factors, adequate sleep and healthy diet Cardiac event monitor not yet completed - she wishes to hold off on monitoring at this time -discussed  indication and potential risks with holding off on monitoring which patient verbalized understanding - will plan on further discussing again at follow-up visit continue aspirin 81 mg daily  and atorvastatin 80 mg  daily for secondary stroke prevention Discussed secondary stroke prevention measures and importance of close PCP follow up for aggressive stroke risk factor management  HTN: BP goal 130-160.  Stable today on losartan 50 mg daily and amlodipine 2.5 mg daily per PCP HLD: LDL goal <70.  Reports lipid panel obtained by PCP unable to view via epic.  Refill provided for atorvastatin 80 mg daily but advised ongoing refills will need to be obtained by PCP DMII: A1c goal<7.0.  Prior A1c 8.2.  Ran out of metformin and Lantus -advised to contact PCPs office today to obtain refills Intracranial stenosis: Advised to call Dr. Fatima Sangereveshwar's office today to schedule recommended follow-up visit after cerebral angiogram that was completed 07/07/2020 Depression with anxiety:  Tension headaches: referral placed to behavioral health Start amitriptyline 25 mg nightly Likely contributing factors to continued short-term memory loss complaints    Follow up in 4 months or call earlier if needed   CC:  [facility] provider: Dr. Pearlean BrownieSethi PCP: [facility]    I spent 36 minutes of face-to-face and non-face-to-face time with patient.  This included previsit chart review, lab review, study review, order entry, electronic health record documentation, patient education regarding stroke and possible etiology, secondary stroke prevention measures and aggressive stroke risk factor management, residual deficits and participation with outpatient therapies, indication for IR evaluation and cardiac monitor, and answered all other questions to patient satisfaction   Ihor AustinJessica McCue, [facility] ClinicsGNP-BC  [facility] [address] [place], [place] [id number]  Phone [phone] Fax [phone] Note: This  document was prepared with digital dictation and possible smart phrase technology. Any transcriptional errors that result from this process are unintentional.

## 2020-09-02 NOTE — Telephone Encounter (Signed)
Called to reschedule consult, no answer, left vm. AW  

## 2020-09-02 NOTE — Telephone Encounter (Signed)
Pt has called for the  amitriptyline (ELAVIL) 25 MG tablet to be called into [facility] [phone]

## 2020-09-02 NOTE — Telephone Encounter (Signed)
Sent my chart advising Rx already sent.

## 2020-09-17 ENCOUNTER — Ambulatory Visit: Payer: Medicaid Other

## 2020-09-18 ENCOUNTER — Encounter: Payer: Medicaid Other | Admitting: Physical Therapy

## 2020-09-21 ENCOUNTER — Telehealth: Payer: Self-pay | Admitting: Adult Health

## 2020-09-21 ENCOUNTER — Other Ambulatory Visit: Payer: Self-pay

## 2020-09-21 ENCOUNTER — Ambulatory Visit (HOSPITAL_COMMUNITY)
Admission: RE | Admit: 2020-09-21 | Discharge: 2020-09-21 | Disposition: A | Discharge status: Home / Self Care | Payer: Medicaid Other | Source: Ambulatory Visit | Attending: Interventional Radiology | Admitting: Interventional Radiology

## 2020-09-21 DIAGNOSIS — I6521 Occlusion and stenosis of right carotid artery: Secondary | ICD-10-CM

## 2020-09-21 NOTE — Telephone Encounter (Signed)
No clear indication to be on both Plavix and aspirin from a stroke standpoint as combined therapies after certain duration have been shown to increase risk of ICH over lowering stroke risk. If PCP is advising this and would like to discuss, they can reach out to our office or send message. Thank you

## 2020-09-21 NOTE — Telephone Encounter (Signed)
Contacted pt back, she stated that her Pcp wanted her to be placed on Plavix. Please advise

## 2020-09-21 NOTE — Telephone Encounter (Signed)
Patient requesting call back from nurse regarding visit with her primary. Best call back is [phone].

## 2020-09-21 NOTE — Telephone Encounter (Signed)
Contacted pt back, informed her that per Shanda Bumps, No clear indication to be on both Plavix and aspirin from a stroke standpoint as combined therapies after certain duration have been shown to increase risk of ICH over lowering stroke risk. If PCP is advising this and would like to discuss, they can reach out to our office or send message. She also asked about paperwork she dropped off today to be filled out by tomorrow morning. Informed her that we will likely not have it done by in the morning as nurses have not received it yet and we will call when completed.

## 2020-09-21 NOTE — Telephone Encounter (Signed)
Pt called requesting refill for PLAVIX. Pharmacy [facility] [phone]

## 2020-09-22 ENCOUNTER — Telehealth: Payer: Self-pay

## 2020-09-22 ENCOUNTER — Other Ambulatory Visit (HOSPITAL_COMMUNITY): Payer: Self-pay | Admitting: Student

## 2020-09-22 HISTORY — PX: IR RADIOLOGIST EVAL & MGMT: IMG5224

## 2020-09-22 MED ORDER — CLOPIDOGREL BISULFATE 75 MG PO TABS: 75.0000 mg | ORAL_TABLET | Freq: Every day | ORAL | 0 refills | Status: DC

## 2020-09-22 NOTE — Telephone Encounter (Signed)
Pt picked up  form claimed it wasn't signed.

## 2020-09-22 NOTE — Telephone Encounter (Signed)
Pt called needing to discuss her forms that she dropped off to be filled out. Please advise.

## 2020-09-22 NOTE — Telephone Encounter (Signed)
Stanton Kidney contacted pt and informed her that we couldn't sign this form and pt is requesting a call back from nurse. I contacted pt, LVM per DPR and informed her that Shanda Bumps is not willing to complete this form due to pt not completing PT and SLP that was placed 09/02/20. Pt needs to call them and get scheduled. Advised to call back with questions.  Form still with medical records

## 2020-09-22 NOTE — Telephone Encounter (Signed)
Form received back to me, Shanda Bumps is not willing to complete this form due to pt not completing PT and SLP that was placed 09/02/20. Pt needs to call them and get scheduled. Form given back Stanton Kidney in medical records

## 2020-09-22 NOTE — Telephone Encounter (Signed)
Form received and placed on jessicas desk for review and signature.

## 2020-09-24 ENCOUNTER — Telehealth (HOSPITAL_COMMUNITY): Payer: Self-pay | Admitting: Radiology

## 2020-09-24 ENCOUNTER — Other Ambulatory Visit: Payer: Self-pay | Admitting: Student

## 2020-09-24 NOTE — Telephone Encounter (Signed)
Spoke to pt about scheduling her IR stenosis treatment with Dr. Corliss Skains. Pt will restart Plavix 75mg  1 QD tomorrow to call med in). She will come to [facility] to have a P2Y12 blood test on 10/02/20. Based on her results we will schedule following that blood test. JM

## 2020-10-02 ENCOUNTER — Other Ambulatory Visit (HOSPITAL_COMMUNITY)
Admission: RE | Admit: 2020-10-02 | Discharge: 2020-10-02 | Disposition: A | Discharge status: Home / Self Care | Payer: Medicaid Other | Source: Ambulatory Visit | Attending: Interventional Radiology | Admitting: Interventional Radiology

## 2020-10-02 ENCOUNTER — Other Ambulatory Visit: Payer: Self-pay | Admitting: Student

## 2020-10-02 DIAGNOSIS — I63511 Cerebral infarction due to unspecified occlusion or stenosis of right middle cerebral artery: Secondary | ICD-10-CM

## 2020-10-05 LAB — PLATELET INHIBITION P2Y12

## 2020-10-13 ENCOUNTER — Telehealth (HOSPITAL_COMMUNITY): Payer: Self-pay | Admitting: Radiology

## 2020-10-13 ENCOUNTER — Other Ambulatory Visit (HOSPITAL_COMMUNITY): Payer: Self-pay | Admitting: Physician Assistant

## 2020-10-13 MED ORDER — CLOPIDOGREL BISULFATE 75 MG PO TABS: 75.0000 mg | ORAL_TABLET | Freq: Two times a day (BID) | ORAL | 3 refills | Status: DC

## 2020-10-13 NOTE — Telephone Encounter (Signed)
Called pt, left VM for her to call me back concerning her upcoming procedure with Dr. Corliss Skains. Pt's Plavix dose will be increased and her DOS will be pushed back a couple of days in order to make these changes. JM

## 2020-10-14 ENCOUNTER — Telehealth (HOSPITAL_COMMUNITY): Payer: Self-pay | Admitting: Radiology

## 2020-10-14 NOTE — Telephone Encounter (Signed)
Pt called me at 9 pm yesterday to discuss her medication and recent P2Y12 results. Per Dr. Corliss Skains pt is to now take Plavix 75mg  1 B.I.D. starting 10/14/20. She will come to [facility] radiology on 10/19/20 for a repeat P2Y12. Pt agrees with this plan of care. JM

## 2020-10-19 ENCOUNTER — Other Ambulatory Visit (HOSPITAL_COMMUNITY): Payer: Self-pay | Admitting: Radiology

## 2020-10-19 ENCOUNTER — Other Ambulatory Visit (HOSPITAL_COMMUNITY)
Admission: RE | Admit: 2020-10-19 | Discharge: 2020-10-19 | Disposition: A | Discharge status: Home / Self Care | Payer: Medicaid Other | Source: Ambulatory Visit | Attending: Interventional Radiology | Admitting: Interventional Radiology

## 2020-10-19 ENCOUNTER — Telehealth: Payer: Self-pay | Admitting: Student

## 2020-10-19 DIAGNOSIS — I639 Cerebral infarction, unspecified: Secondary | ICD-10-CM | POA: Diagnosis not present

## 2020-10-19 NOTE — Telephone Encounter (Signed)
Patient called to report increased side effects from higher Plavix dosage.  She was previously tolerating well, however has developed shortness of breath and lightheadedness when doubling dose.  No other focal neuro complaints. She has been taking the medication with water on an empty stomach.  Discussed taking with small breakfast and a cup of coffee/caffeinated beverage. Patient knows to call back if there are changes or additional concerns.  She did come in this AM to have a P2Y12 drawn.  She is aware this may take several days to come back.   Loyce Dys, MS RD PA-C 11:06 AM

## 2020-10-21 ENCOUNTER — Telehealth (HOSPITAL_COMMUNITY): Payer: Self-pay

## 2020-10-21 NOTE — Telephone Encounter (Signed)
Returned pt's call to let her know that her labs are not back yet, no answer, left vm. AW

## 2020-10-22 LAB — PLATELET INHIBITION P2Y12

## 2020-10-27 ENCOUNTER — Other Ambulatory Visit (HOSPITAL_COMMUNITY): Payer: Self-pay | Admitting: Interventional Radiology

## 2020-10-27 ENCOUNTER — Telehealth (HOSPITAL_COMMUNITY): Payer: Self-pay

## 2020-10-27 DIAGNOSIS — I771 Stricture of artery: Secondary | ICD-10-CM

## 2020-10-27 NOTE — Telephone Encounter (Signed)
Called to schedule procedure with Dr. Corliss Skains, no answer, left vm. AW

## 2020-11-02 ENCOUNTER — Other Ambulatory Visit: Payer: Self-pay | Admitting: Radiology

## 2020-11-03 NOTE — Progress Notes (Addendum)
Patient called and stated that she would like to cancel her procedure for 11/04/20 because she does not have transportation.    Update: Chrisitine in IR notified that patient needs to reschedule her procedure  Jeanice Lim, charge CRNA aware.

## 2020-11-04 ENCOUNTER — Ambulatory Visit (HOSPITAL_COMMUNITY)
Admission: RE | Admit: 2020-11-04 | Payer: Medicaid Other | Source: Home / Self Care | Admitting: Interventional Radiology

## 2020-11-04 ENCOUNTER — Encounter (HOSPITAL_COMMUNITY): Admission: RE | Payer: Self-pay | Source: Home / Self Care

## 2020-11-04 ENCOUNTER — Ambulatory Visit (HOSPITAL_COMMUNITY): Admission: RE | Admit: 2020-11-04 | Payer: Medicaid Other | Source: Ambulatory Visit

## 2020-11-04 SURGERY — IR WITH ANESTHESIA
Anesthesia: General

## 2020-11-17 ENCOUNTER — Ambulatory Visit (HOSPITAL_COMMUNITY): Payer: Self-pay | Admitting: Licensed Clinical Social Worker

## 2020-12-16 ENCOUNTER — Telehealth (HOSPITAL_COMMUNITY): Payer: Self-pay

## 2020-12-16 ENCOUNTER — Encounter: Payer: Self-pay | Admitting: Adult Health

## 2020-12-16 ENCOUNTER — Ambulatory Visit: Payer: Medicaid Other | Admitting: Adult Health

## 2020-12-16 NOTE — Progress Notes (Deleted)
[facility] [address] [place]. [place] [address] [phone]       HOSPITAL FOLLOW UP NOTE  Ms. Crystal FORNI Date of Birth:  [birth year] Medical Record Number:  [id number]   Reason for Referral:  hospital stroke follow up    SUBJECTIVE:   CHIEF COMPLAINT:  No chief complaint on file.    HPI:   Update 12/16/2020 JM:          History provided for reference purposes only Update 09/02/2020 JM: [name] returns for 77-month stroke follow-up unaccompanied.  Since prior visit, she presented to ED on 5/19 for posterior headache with severe nausea and vertigo with MRI brain and MRA head/neck unremarkable for acute findings.  She has since been stable from stroke standpoint without new stroke/TIA symptoms.  Reports residual short-term memory concerns, vertigo, and daily headaches (bifrontal pressure sensation) as well as intermittent slurred speech. She does endorse increased stress levels as well as depression/anxiety - was previously on Celexa but since stopped as she ran out of it.  She is currently unemployed previously working at a daycare initially reporting that she was fired after she told them about her stroke and memory loss but then later reported that she quit any after she confronted another employee regarding her disciplining methods.  She is able to maintain ADLs and IADLs independently but has more difficulty with short-term memory such as remembering appointments.  MMSE today 27/30.  She also endorses frustration regarding her family and being "disowned" after having her stroke. She underwent cerebral angiogram by Dr. Corliss Skains on 5/31 (see below). Has not yet had follow up with Dr. Corliss Skains regarding results. Remains on aspirin without associated side effects.  Recently ran out of atorvastatin as well as other multiple medications. Blood pressure today 155/93 -not routinely monitored at home.  She recently saw her PCP in June and reports PCP  dx'd with anemia and low iron - since started iron supplements.  No further concerns at this time.     Diagnostic imaging:  Cerebral angiogram 07/07/2020 IMPRESSION: Approximately 50% stenosis of the right middle cerebral artery distal M1 segment.   Severe segmental stenosis of a branch of the inferior division of the right middle cerebral artery in the M2 region.   Occlusion of distal mid perisylvian branch in the M3 M4 region.   Moderate to moderately severe focal area of narrowing of a branch of the inferior division of the left middle cerebral artery in the M3 M4 region.   MR BRAIN MRA head/neck 06/25/2020 IMPRESSION: 1. Expected evolution of signal changes at the site of recent infarct of the right frontal lobe. No hemorrhage or mass effect. 2. Normal MRA of the head and neck.    Initial visit 06/03/2020 JM: Crystal Boyer is being seen for hospital follow-up accompanied by her daughter  Since discharge, she does report slowly recovering although continues to experience occasional slurred speech, drooling, short-term memory difficulties, blurred vision and headache. She was evaluated in the ED on 4/12 for concerns of increased dyspnea after her first dose of Brilinta the night prior (she was not aware that this needed to be started prior) as well as 1 week visual changes, dizziness and headache.  MRI negative for acute findings and recommended discontinuing Brilinta and initiate Plavix in addition to aspirin.  She apparently has not started taking Plavix but has remained on aspirin without side effects.  Remains on atorvastatin 80 mg daily without myalgias.  Blood pressure today elevated at 178/94 -  per patient, she has had great difficulty managing blood pressure even prior to her stroke.  She is not currently monitoring at home.  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  Frontal headaches and reported blurred vision typically occur mid to later  afternoon.  She has not yet been seen by ophthalmology.  She has not had follow-up with IR Dr. Corliss Skains as advised at discharge.  She denies receiving cardiac monitor although per review of epic, this was sent to her approximately 1 month ago.  No further concerns at this time.  Stroke admission 04/30/2020 Crystal Boyer is a 60 yo female who presented on 04/30/2020 by EMS after seeing her PCP for left facial droop that began 5 days ago. She had a short period of dysarthria on the day symptoms were first experienced that quickly resolved. Has had no aphasia. No history of stroke. On examination, her positive findings are left facial droop,  + finger roll test on the left, subtle LUE weakness and left sided extinction to DSS.   Personally reviewed hospitalization pertinent progress notes, lab work and imaging with summary provided.  Evaluated by Dr. Roda Shutters with stroke work-up revealing foci of restricted diffusion within the right centrum semiovale and corona radiata, consistent with acute/subacute infarcts in a watershed distribution likely in setting of large vessel disease from right M2 occlusion or near occlusion given risk factors and multifocal intracranial stenosis, R M2 occlusion/near occlusion likely due to arthrosclerosis however cardioembolic source cannot be completely ruled out.  Recommended follow-up outpatient IR for possible intervention/stent placement as well as 30-day cardiac event monitoring to rule out A. fib.  Recommended aspirin and Brilinta for 30 days then aspirin alone.  HTN stable.  LDL 128 and initiate atorvastatin 80 mg daily.  Uncontrolled DM with A1c 8.2.  Evaluated by therapies with recommended OP PT and discharged home in stable condition.  Stroke: Foci of restricted diffusion within the right centrum semiovale and corona radiata, consistent with acute/subacute infarcts in a watershed distribution  likely secondary to with severe, near occlusive stenosis versus near occlusive  thrombus superior  division proximal M2 right MCA branch vessel although embolic source cannot be completely ruled out   Code Stroke CT head No acute abnormality.  Redemonstrated patchy and ill-defined hypoattenuation within the bilateral cerebral white matter, which is overall moderate in severity but advanced for age. Findings are nonspecific, but most often secondary to chronic small vessel ischemia. Mild bilateral maxillary sinus mucosal thickening.   CTA head & neck  Unchanged short-segment apparent flow gap within a superior  division proximal M2 right MCA branch vessel: severe, near occlusive stenosis versus near occlusive thrombus. Mild stenosis of the distal M1 right middle cerebral artery. Calcified plaque within the intracranial ICAs with mild/moderate stenosis of the paraclinoid segments bilaterally. Mild atherosclerotic narrowing of the V4 right vertebral artery.   MRI : Foci of restricted diffusion within the right centrum semiovale and corona radiata, consistent with acute/subacute infarcts in a watershed distribution.   TCD bubble study: Negative   2D Echo: EF 55-60%, No thrombus, wall motion abnormality or shunt found.   LDL 128 HgbA1c 8.2 Therapy recommendations:  Cleared for home  Disposition:  Home with home health PT, planning to DC today 30 day cardiac monitoring request sent to cards master Plan is for ASA 81mg  daily and Brillinta 90mg  BID for 30 days then ASA 81 mg alone.       ROS:   14 system review  of systems performed and negative with exception of those listed in HPI  PMH:  Past Medical History:  Diagnosis Date   Allergy    Anemia    Asthma    Depression    Diabetes mellitus    Gout 12 days ago   Hypertension     PSH:  Past Surgical History:  Procedure Laterality Date   ABDOMINAL HYSTERECTOMY     ANKLE FRACTURE SURGERY     right ankle   CESAREAN SECTION     IR ANGIO INTRA EXTRACRAN SEL COM CAROTID INNOMINATE BILAT MOD SED  07/07/2020   IR  ANGIO VERTEBRAL SEL VERTEBRAL UNI R MOD SED  07/07/2020   IR RADIOLOGIST EVAL & MGMT  06/19/2020   IR RADIOLOGIST EVAL & MGMT  09/22/2020   IR US GUIDE VASC ACCESS RIGHT  07/07/2020    Social History:  Social History   Socioeconomic History   Marital status: Single    Spouse name: Not on file   Number of children: 3   Years of education: Not on file   Highest education level: Not on file  Occupational History    Comment: teacher  Tobacco Use   Smoking status: Never   Smokeless tobacco: Never  Vaping Use   Vaping Use: Never used  Substance and Sexual Activity   Alcohol use: No    Alcohol/week: 0.0 standard drinks    Comment: social   Drug use: Yes    Types: Marijuana    Comment: 1x monthly   Sexual activity: Yes    Birth control/protection: Condom  Other Topics Concern   Not on file  Social History Narrative   Lives alone   Social Determinants of Health   Financial Resource Strain: Not on file  Food Insecurity: Not on file  Transportation Needs: Not on file  Physical Activity: Not on file  Stress: Not on file  Social Connections: Not on file  Intimate Partner Violence: Not on file    Family History:  Family History  Problem Relation Age of Onset   Hypertension Mother    Diabetes Mother    Hypertension Father    Diabetes Father    Colon cancer Neg Hx     Medications:   Current Outpatient Medications on File Prior to Visit  Medication Sig Dispense Refill   amitriptyline (ELAVIL) 25 MG tablet Take 1 tablet (25 mg total) by mouth at bedtime. (Patient not taking: No sig reported) 30 tablet 5   amLODipine (NORVASC) 2.5 MG tablet Take 1 tablet (2.5 mg total) by mouth daily. (Patient taking differently: Take 2.5 mg by mouth at bedtime.) 30 tablet 4   aspirin EC 81 MG EC tablet Take 1 tablet (81 mg total) by mouth daily. Swallow whole. 30 tablet 11   atorvastatin (LIPITOR) 80 MG tablet Take 1 tablet (80 mg total) by mouth daily. 90 tablet 3   clopidogrel (PLAVIX) 75  MG tablet Take 1 tablet (75 mg total) by mouth daily. (Patient not taking: No sig reported) 30 tablet 0   clopidogrel (PLAVIX) 75 MG tablet Take 1 tablet (75 mg total) by mouth 2 (two) times daily. 60 tablet 3   ferrous sulfate 324 MG TBEC Take 324 mg by mouth daily with breakfast.     LANTUS SOLOSTAR 100 UNIT/ML Solostar Pen Inject 30 Units into the skin at bedtime. (Patient taking differently: Inject 30 Units into the skin daily.) 15 mL 5   losartan (COZAAR) 50 MG tablet Take 50 mg by mouth daily.  meclizine (ANTIVERT) 25 MG tablet Take 1 tablet (25 mg total) by mouth 3 (three) times daily as needed for dizziness. (Patient not taking: Reported on 10/29/2020) 30 tablet 0   [DISCONTINUED] albuterol (PROVENTIL HFA;VENTOLIN HFA) 108 (90 Base) MCG/ACT inhaler Inhale 2 puffs into the lungs every 6 (six) hours as needed for wheezing or shortness of breath. (Patient not taking: Reported on 07/03/2018) 1 Inhaler 1   No current facility-administered medications on file prior to visit.    Allergies:   Allergies  Allergen Reactions   Eggs Or Egg-Derived Products Diarrhea   Actos [Pioglitazone] Other (See Comments)    Made the patient feel like she was "going to die"   Brilinta [Ticagrelor]     Shortness of breath    Glipizide Other (See Comments)    Made patient feel like she was "going to pass out and die"   Tramadol Rash      OBJECTIVE:  Physical Exam  There were no vitals filed for this visit.   There is no height or weight on file to calculate BMI. No results found.  General: well developed, well nourished, very pleasant middle-age African-American female, seated, in no evident distress Head: head normocephalic and atraumatic.   Neck: supple with no carotid or supraclavicular bruits Cardiovascular: regular rate and rhythm, no murmurs Musculoskeletal: no deformity Skin:  no rash/petichiae Vascular:  Normal pulses all extremities   Neurologic Exam Mental Status: Awake and fully  alert.  Fluent speech and language.  Oriented to place and time. Recent memory subjectively impaired and remote memory intact. Attention span, concentration and fund of knowledge appropriate during visit. Mood and affect appropriate.  Cranial Nerves: Pupils equal, briskly reactive to light. Extraocular movements full without nystagmus. Visual fields full to confrontation. Hearing intact. Facial sensation intact.  Slight left lower facial weakness when smiling. Tongue and palate moves normally and symmetrically.  Motor: Normal bulk and tone. Normal strength in all tested extremity muscles  Sensory.: intact to touch , pinprick , position and vibratory sensation.  Coordination: Rapid alternating movements normal in all extremities. Finger-to-nose and heel-to-shin performed accurately bilaterally.   Gait and Station: Arises from chair without difficulty. Stance is normal. Gait demonstrates normal stride length and balance without use of assistive device.  Reflexes: 1+ and symmetric. Toes downgoing.    MMSE - Mini Mental State Exam 09/02/2020  Orientation to time 5  Orientation to Place 5  Registration 3  Attention/ Calculation 3  Recall 3  Language- name 2 objects 2  Language- repeat 1  Language- follow 3 step command 3  Language- read & follow direction 1  Write a sentence 1  Copy design 0  Total score 27        ASSESSMENT: Crystal Boyer is a 60 y.o. year old female presented with 5 day onset of left facial droop and short period of dysarthria with evidence of mild LUE weakness on 04/30/2020 with stroke work-up revealing right centrum semiovale and corona radiata infarcts likely secondary to R M2 occlusion/near occlusion in setting of arthrosclerosis although embolic source cannot be completely ruled out. Vascular risk factors include HTN, HLD, DM, and diffuse intracranial stenosis.     PLAN:  R SO and CR infarct :  Residual deficit: Short-term memory loss and imbalance with  vertigo -referral placed to neuro rehab PT and SLP.  Discussed importance of routine physical and mental exercises at home as well as managing stressors, managing stroke risk factors, adequate sleep and healthy diet Cardiac event  monitor not yet completed - she wishes to hold off on monitoring at this time -discussed indication and potential risks with holding off on monitoring which patient verbalized understanding - will plan on further discussing again at follow-up visit continue aspirin 81 mg daily  and atorvastatin 80 mg daily for secondary stroke prevention Discussed secondary stroke prevention measures and importance of close PCP follow up for aggressive stroke risk factor management  HTN: BP goal 130-160.  Stable today on losartan 50 mg daily and amlodipine 2.5 mg daily per PCP HLD: LDL goal <70.  Managed by PCP DMII: A1c goal<7.0.  Managed by PCP Intracranial stenosis: Advised to call Dr. Fatima Sanger office today to schedule recommended follow-up visit after cerebral angiogram that was completed 07/07/2020 Depression with anxiety  Tension headaches: referral placed to behavioral health Start amitriptyline 25 mg nightly Likely contributing factors to continued short-term memory loss complaints    Follow up in 4 months or call earlier if needed   CC:  [facility] provider: Dr. Pearlean Brownie PCP: [facility]    I spent 36 minutes of face-to-face and non-face-to-face time with patient.  This included previsit chart review, lab review, study review, order entry, electronic health record documentation, patient education regarding stroke and possible etiology, secondary stroke prevention measures and aggressive stroke risk factor management, residual deficits and participation with outpatient therapies, indication for IR evaluation and cardiac monitor, and answered all other questions to patient satisfaction   Ihor Austin, [facility]  [facility] [address] [place], [place] [address]  Phone [phone] Fax [phone] Note: This document was prepared with digital dictation and possible smart phrase technology. Any transcriptional errors that result from this process are unintentional.

## 2020-12-16 NOTE — Telephone Encounter (Signed)
Called pt to schedule procedure, no answer, no vm. AW

## 2020-12-17 NOTE — Telephone Encounter (Signed)
error 

## 2020-12-21 ENCOUNTER — Other Ambulatory Visit (HOSPITAL_COMMUNITY): Payer: Self-pay

## 2020-12-21 ENCOUNTER — Other Ambulatory Visit (HOSPITAL_COMMUNITY): Payer: Self-pay | Admitting: Interventional Radiology

## 2020-12-21 ENCOUNTER — Other Ambulatory Visit (HOSPITAL_COMMUNITY)
Admission: RE | Admit: 2020-12-21 | Discharge: 2020-12-21 | Disposition: A | Discharge status: Home / Self Care | Payer: Medicaid Other | Source: Ambulatory Visit | Attending: Interventional Radiology | Admitting: Interventional Radiology

## 2020-12-21 DIAGNOSIS — I771 Stricture of artery: Secondary | ICD-10-CM

## 2020-12-22 ENCOUNTER — Other Ambulatory Visit (HOSPITAL_COMMUNITY): Payer: Self-pay | Admitting: Physician Assistant

## 2020-12-22 LAB — PLATELET INHIBITION P2Y12

## 2020-12-22 LAB — SARS CORONAVIRUS 2 (TAT 6-24 HRS): SARS Coronavirus 2: NEGATIVE

## 2020-12-22 NOTE — Progress Notes (Signed)
Called pt to give pre-op instructions. All numbers attempted and LVM with call back number and brief pre-op instructions.

## 2020-12-23 ENCOUNTER — Observation Stay (HOSPITAL_COMMUNITY): Admission: RE | Admit: 2020-12-23 | Payer: Medicaid Other | Source: Ambulatory Visit

## 2020-12-23 ENCOUNTER — Encounter (HOSPITAL_COMMUNITY): Payer: Self-pay | Admitting: Anesthesiology

## 2020-12-23 ENCOUNTER — Other Ambulatory Visit: Payer: Self-pay

## 2020-12-23 ENCOUNTER — Encounter (HOSPITAL_COMMUNITY)
Admission: RE | Disposition: A | Discharge status: Home / Self Care | Payer: Self-pay | Source: Home / Self Care | Attending: Interventional Radiology

## 2020-12-23 ENCOUNTER — Ambulatory Visit (HOSPITAL_COMMUNITY)
Admission: RE | Admit: 2020-12-23 | Discharge: 2020-12-23 | Disposition: A | Discharge status: Home / Self Care | Payer: Medicaid Other | Attending: Interventional Radiology | Admitting: Interventional Radiology

## 2020-12-23 ENCOUNTER — Encounter (HOSPITAL_COMMUNITY): Payer: Self-pay | Admitting: Interventional Radiology

## 2020-12-23 ENCOUNTER — Other Ambulatory Visit: Payer: Self-pay | Admitting: Radiology

## 2020-12-23 DIAGNOSIS — Z9114 Patient's other noncompliance with medication regimen: Secondary | ICD-10-CM | POA: Diagnosis not present

## 2020-12-23 DIAGNOSIS — Z7902 Long term (current) use of antithrombotics/antiplatelets: Secondary | ICD-10-CM | POA: Diagnosis not present

## 2020-12-23 DIAGNOSIS — Z01812 Encounter for preprocedural laboratory examination: Secondary | ICD-10-CM | POA: Insufficient documentation

## 2020-12-23 DIAGNOSIS — M7989 Other specified soft tissue disorders: Secondary | ICD-10-CM | POA: Diagnosis not present

## 2020-12-23 DIAGNOSIS — I10 Essential (primary) hypertension: Secondary | ICD-10-CM | POA: Insufficient documentation

## 2020-12-23 DIAGNOSIS — Z7982 Long term (current) use of aspirin: Secondary | ICD-10-CM | POA: Diagnosis not present

## 2020-12-23 LAB — CBC WITH DIFFERENTIAL/PLATELET
Abs Immature Granulocytes: 0.02 10*3/uL (ref 0.00–0.07)
Basophils Absolute: 0 10*3/uL (ref 0.0–0.1)
Basophils Relative: 1 %
Eosinophils Absolute: 0.4 10*3/uL (ref 0.0–0.5)
Eosinophils Relative: 7 %
HCT: 39.2 % (ref 36.0–46.0)
Hemoglobin: 12.1 g/dL (ref 12.0–15.0)
Immature Granulocytes: 0 %
Lymphocytes Relative: 24 %
Lymphs Abs: 1.3 10*3/uL (ref 0.7–4.0)
MCH: 27.2 pg (ref 26.0–34.0)
MCHC: 30.9 g/dL (ref 30.0–36.0)
MCV: 88.1 fL (ref 80.0–100.0)
Monocytes Absolute: 0.5 10*3/uL (ref 0.1–1.0)
Monocytes Relative: 9 %
Neutro Abs: 3.1 10*3/uL (ref 1.7–7.7)
Neutrophils Relative %: 59 %
Platelets: 209 10*3/uL (ref 150–400)
RBC: 4.45 MIL/uL (ref 3.87–5.11)
RDW: 13 % (ref 11.5–15.5)
WBC: 5.3 10*3/uL (ref 4.0–10.5)
nRBC: 0 % (ref 0.0–0.2)

## 2020-12-23 LAB — PROTIME-INR
INR: 1 (ref 0.8–1.2)
Prothrombin Time: 12.7 seconds (ref 11.4–15.2)

## 2020-12-23 LAB — BASIC METABOLIC PANEL
Anion gap: 7 (ref 5–15)
BUN: 14 mg/dL (ref 6–20)
CO2: 25 mmol/L (ref 22–32)
Calcium: 9 mg/dL (ref 8.9–10.3)
Chloride: 106 mmol/L (ref 98–111)
Creatinine, Ser: 0.93 mg/dL (ref 0.44–1.00)
GFR, Estimated: >60 mL/min (ref >60)
Glucose, Bld: 190 mg/dL — ABNORMAL HIGH (ref 70–99)
Potassium: 3.8 mmol/L (ref 3.5–5.1)
Sodium: 138 mmol/L (ref 135–145)

## 2020-12-23 LAB — GLUCOSE, CAPILLARY: Glucose-Capillary: 185 mg/dL — ABNORMAL HIGH (ref 70–99)

## 2020-12-23 LAB — SURGICAL PCR SCREEN
MRSA, PCR: NEGATIVE
Staphylococcus aureus: NEGATIVE

## 2020-12-23 SURGERY — IR WITH ANESTHESIA
Anesthesia: General

## 2020-12-23 MED ORDER — CHLORHEXIDINE GLUCONATE 0.12 % MT SOLN: 15.0000 mL | Freq: Once | OROMUCOSAL | Status: AC

## 2020-12-23 MED ORDER — LABETALOL HCL 5 MG/ML IV SOLN: 5.0000 mg | Freq: Once | INTRAVENOUS | Status: DC

## 2020-12-23 MED ORDER — SODIUM CHLORIDE 0.9 % IV SOLN: INTRAVENOUS | Status: DC

## 2020-12-23 MED ORDER — CEFAZOLIN SODIUM-DEXTROSE 2-4 GM/100ML-% IV SOLN
2.0000 g | Freq: Once | INTRAVENOUS | Status: DC
  Filled 2020-12-23: qty 100

## 2020-12-23 MED ORDER — NIMODIPINE 30 MG PO CAPS
0.0000 mg | ORAL_CAPSULE | ORAL | Status: DC
  Filled 2020-12-23: qty 2

## 2020-12-23 MED ORDER — CHLORHEXIDINE GLUCONATE 0.12 % MT SOLN
OROMUCOSAL | Status: AC
  Administered 2020-12-23: 15 mL via OROMUCOSAL
  Filled 2020-12-23: qty 15

## 2020-12-23 MED ORDER — CLOPIDOGREL BISULFATE 75 MG PO TABS
75.0000 mg | ORAL_TABLET | ORAL | Status: DC
  Filled 2020-12-23: qty 1

## 2020-12-23 MED ORDER — ASPIRIN EC 325 MG PO TBEC
325.0000 mg | DELAYED_RELEASE_TABLET | ORAL | Status: DC
  Filled 2020-12-23: qty 1

## 2020-12-23 MED ORDER — LABETALOL HCL 5 MG/ML IV SOLN
INTRAVENOUS | Status: AC
  Filled 2020-12-23: qty 4

## 2020-12-23 MED ORDER — ACETAMINOPHEN 500 MG PO TABS
1000.0000 mg | ORAL_TABLET | Freq: Once | ORAL | Status: DC
  Filled 2020-12-23: qty 2

## 2020-12-23 MED ORDER — LABETALOL HCL 5 MG/ML IV SOLN
10.0000 mg | Freq: Once | INTRAVENOUS | Status: AC
  Administered 2020-12-23: 10 mg via INTRAVENOUS

## 2020-12-23 MED ORDER — ORAL CARE MOUTH RINSE: 15.0000 mL | Freq: Once | OROMUCOSAL | Status: AC

## 2020-12-23 MED ORDER — LACTATED RINGERS IV SOLN: INTRAVENOUS | Status: DC

## 2020-12-23 NOTE — Progress Notes (Signed)
BP this am elevated 191/99. Patient verbalized that she didn't have her BP medicine for about one week. When rechecked, BP increased to 202/108, 220/103. Per Dr. Stephannie Peters, patient received Labetalol 10 mg IV. Will continue to monitor.

## 2020-12-23 NOTE — Progress Notes (Signed)
Patient ID: Crystal Boyer, female   DOB: [birth year], 60 y.o.   MRN: [id number]   Was scheduled for NIR procedure with Dr Corliss Skains today  BP too high to move ahead safely. 220/103; 197/112 Latetolol po per Anesthesia MD--- did bring down temporarily to 178/148  Noted today also is new swelling in hands and feet  Pt admits she has not taken any BP meds for over 1 week She ran out of meds and has not been able to refill.  Dr Corliss Skains has seen and examined pt Will cancel procedure today  Rec:  see PCP for BP control and refill meds asap          Need evaluation of new extremity swelling          Ask MD for Rx for home BP monitor          Continue ASA/Plavix  Pt will hear from [facility] scheduler early next week When BP is under control --- we can reschedule procedure   She is aware and agreeable

## 2020-12-23 NOTE — Anesthesia Preprocedure Evaluation (Deleted)
Anesthesia Evaluation  Patient identified by MRN, date of birth, ID band Patient awake    Reviewed: Allergy & Precautions, NPO status , Patient's Chart, lab work & pertinent test results  History of Anesthesia Complications Negative for: history of anesthetic complications  Airway Mallampati: II  TM Distance: >3 FB Neck ROM: Full    Dental  (+) Missing,    Pulmonary asthma ,    Pulmonary exam normal        Cardiovascular hypertension, Pt. on medications Normal cardiovascular exam     Neuro/Psych Depression CVA (04/2020)    GI/Hepatic negative GI ROS, Neg liver ROS,   Endo/Other  diabetes, Type 2, Insulin Dependent  Renal/GU negative Renal ROS  negative genitourinary   Musculoskeletal negative musculoskeletal ROS (+)   Abdominal   Peds  Hematology negative hematology ROS (+)   Anesthesia Other Findings Day of surgery medications reviewed with patient.  Reproductive/Obstetrics negative OB ROS                            Anesthesia Physical Anesthesia Plan  ASA: 3  Anesthesia Plan: General   Post-op Pain Management:    Induction: Intravenous  PONV Risk Score and Plan: 3 and Treatment may vary due to age or medical condition, Ondansetron and Dexamethasone  Airway Management Planned: Oral ETT  Additional Equipment: Arterial line  Intra-op Plan:   Post-operative Plan: Extubation in OR  Informed Consent: I have reviewed the patients History and Physical, chart, labs and discussed the procedure including the risks, benefits and alternatives for the proposed anesthesia with the patient or authorized representative who has indicated his/her understanding and acceptance.     Dental advisory given  Plan Discussed with: CRNA  Anesthesia Plan Comments: (Patient hypertensive in preop, noncompliant with home meds. Case cancelled by proceduralist. Stephannie Peters, MD)       Anesthesia Quick  Evaluation

## 2020-12-23 NOTE — Progress Notes (Signed)
Patient was discharged home per MD order. Patient surgery was canceled due to medical condition - elevated BP. When patient left short stay her BP was 178/98. Patient was instructed to contact her PCP to get a refill for her BP medicine. Patient verbalized understanding. Patient left the hospital accompanied by her son.

## 2021-01-05 ENCOUNTER — Telehealth (HOSPITAL_COMMUNITY): Payer: Self-pay

## 2021-01-05 NOTE — Telephone Encounter (Signed)
Called to reschedule intervention, no answer, left vm. AW

## 2021-01-13 ENCOUNTER — Emergency Department (HOSPITAL_COMMUNITY)
Admission: EM | Admit: 2021-01-13 | Discharge: 2021-01-13 | Disposition: A | Discharge status: Home / Self Care | Payer: Medicaid Other | Attending: Emergency Medicine | Admitting: Emergency Medicine

## 2021-01-13 DIAGNOSIS — Z5321 Procedure and treatment not carried out due to patient leaving prior to being seen by health care provider: Secondary | ICD-10-CM | POA: Insufficient documentation

## 2021-01-13 DIAGNOSIS — J029 Acute pharyngitis, unspecified: Secondary | ICD-10-CM | POA: Diagnosis present

## 2021-01-13 DIAGNOSIS — Z20822 Contact with and (suspected) exposure to covid-19: Secondary | ICD-10-CM | POA: Diagnosis not present

## 2021-01-13 DIAGNOSIS — R059 Cough, unspecified: Secondary | ICD-10-CM | POA: Diagnosis not present

## 2021-01-13 LAB — GROUP A STREP BY PCR: Group A Strep by PCR: NOT DETECTED

## 2021-01-13 LAB — RESP PANEL BY RT-PCR (FLU A&B, COVID) ARPGX2
Influenza A by PCR: NEGATIVE
Influenza B by PCR: NEGATIVE
SARS Coronavirus 2 by RT PCR: NEGATIVE

## 2021-01-13 NOTE — ED Notes (Signed)
Called pt x3 for room, no response. 

## 2021-01-13 NOTE — ED Notes (Signed)
Called no answer

## 2021-01-13 NOTE — ED Triage Notes (Signed)
Pt. Stated, I started having a cough and a sore throat that started yesterday.

## 2021-01-21 ENCOUNTER — Ambulatory Visit (HOSPITAL_COMMUNITY)
Admission: EM | Admit: 2021-01-21 | Discharge: 2021-01-21 | Disposition: A | Discharge status: Home / Self Care | Payer: Medicaid Other | Attending: Emergency Medicine | Admitting: Emergency Medicine

## 2021-01-21 ENCOUNTER — Encounter (HOSPITAL_COMMUNITY): Payer: Self-pay | Admitting: Emergency Medicine

## 2021-01-21 ENCOUNTER — Other Ambulatory Visit: Payer: Self-pay

## 2021-01-21 DIAGNOSIS — L509 Urticaria, unspecified: Secondary | ICD-10-CM | POA: Diagnosis not present

## 2021-01-21 HISTORY — DX: Cerebral infarction, unspecified: I63.9

## 2021-01-21 MED ORDER — PREDNISONE 20 MG PO TABS: 40.0000 mg | ORAL_TABLET | Freq: Every day | ORAL | 0 refills | Status: DC

## 2021-01-21 MED ORDER — DIPHENHYDRAMINE-ZINC ACETATE 2-0.1 % EX CREA: 1.0000 "application " | TOPICAL_CREAM | Freq: Three times a day (TID) | CUTANEOUS | 0 refills | Status: DC | PRN

## 2021-01-21 MED ORDER — METHYLPREDNISOLONE SODIUM SUCC 125 MG IJ SOLR
60.0000 mg | Freq: Once | INTRAMUSCULAR | Status: AC
  Administered 2021-01-21: 60 mg via INTRAMUSCULAR

## 2021-01-21 MED ORDER — METHYLPREDNISOLONE SODIUM SUCC 125 MG IJ SOLR
INTRAMUSCULAR | Status: AC
  Filled 2021-01-21: qty 2

## 2021-01-21 NOTE — ED Provider Notes (Signed)
[place]    CSN: [id number] Arrival date & time: 01/21/21  1545      History   Chief Complaint Chief Complaint  Patient presents with   Rash    HPI Crystal Boyer is a 60 y.o. female.   Patient presents with hives on back, bilateral legs, bilateral arms,chest, abdomen for 2 days. endorses itchy throat and intermittent shortness of breath. Has attempted benadryl, which was not helpful.  Denies difficulty swallowing, involvement of the eyes, blurred vision or seeing floaters.  Unsure of trigger.  Denies changes in soaps, lotions, detergents,, diet or recent travel.  History of diabetes mellitus, hypertension, stroke, asthma, allergies.  Past Medical History:  Diagnosis Date   Allergy    Anemia    Asthma    Depression    Diabetes mellitus    Gout 12 days ago   Hypertension    Stroke [facility])     Patient Active Problem List   Diagnosis Date Noted   CVA (cerebral vascular accident) ([place]) 04/30/2020   Achilles tendon contracture, left 09/25/2017   Posterior tibial tendon dysfunction (PTTD) of right lower extremity 09/25/2017   DIABETES MELLITUS, TYPE II 11/27/2006   HYPERTENSION 11/27/2006   MICROALBUMINURIA 11/27/2006    Past Surgical History:  Procedure Laterality Date   ABDOMINAL HYSTERECTOMY     ANKLE FRACTURE SURGERY     right ankle   CESAREAN SECTION     IR ANGIO INTRA EXTRACRAN SEL COM CAROTID INNOMINATE BILAT MOD SED  07/07/2020   IR ANGIO VERTEBRAL SEL VERTEBRAL UNI R MOD SED  07/07/2020   IR RADIOLOGIST EVAL & MGMT  06/19/2020   IR RADIOLOGIST EVAL & MGMT  09/22/2020   IR US GUIDE VASC ACCESS RIGHT  07/07/2020    OB History   No obstetric history on file.      Home Medications    Prior to Admission medications   Medication Sig Start Date End Date Taking? Authorizing Provider  amitriptyline (ELAVIL) 25 MG tablet Take 1 tablet (25 mg total) by mouth at bedtime. Patient not taking: Reported on 01/21/2021 09/02/20   Frann Rider, NP   amLODipine (NORVASC) 2.5 MG tablet Take 1 tablet (2.5 mg total) by mouth daily. Patient taking differently: Take 2.5 mg by mouth at bedtime. 06/03/20   Frann Rider, NP  aspirin EC 81 MG EC tablet Take 1 tablet (81 mg total) by mouth daily. Swallow whole. 05/02/20   Marianna Payment, MD  atorvastatin (LIPITOR) 80 MG tablet Take 1 tablet (80 mg total) by mouth daily. Patient taking differently: Take 80 mg by mouth at bedtime. 09/02/20 02/13/21  Frann Rider, NP  clopidogrel (PLAVIX) 75 MG tablet Take 1 tablet (75 mg total) by mouth daily. 09/22/20   Han, Aimee H, PA-C  ferrous sulfate 324 MG TBEC Take 324 mg by mouth daily with breakfast.    [provider]  LANTUS SOLOSTAR 100 UNIT/ML Solostar Pen Inject 30 Units into the skin at bedtime. 05/01/20   Marianna Payment, MD  losartan (COZAAR) 50 MG tablet Take 50 mg by mouth daily.    [provider]  Multiple Vitamins-Minerals (ZINC PO) Take 1 tablet by mouth at bedtime.    [provider]  albuterol (PROVENTIL HFA;VENTOLIN HFA) 108 (90 Base) MCG/ACT inhaler Inhale 2 puffs into the lungs every 6 (six) hours as needed for wheezing or shortness of breath. Patient not taking: Reported on 07/03/2018 04/24/18 01/13/19  Harrie Foreman, MD    Family History Family History  Problem Relation Age of Onset   Hypertension Mother    Diabetes Mother    Hypertension Father    Diabetes Father    Colon cancer Neg Hx     Social History Social History   Tobacco Use   Smoking status: Never   Smokeless tobacco: Never  Vaping Use   Vaping Use: Never used  Substance Use Topics   Alcohol use: No    Alcohol/week: 0.0 standard drinks    Comment: social   Drug use: Yes    Types: Marijuana    Comment: 1x monthly     Allergies   Eggs or egg-derived products, Actos [pioglitazone], Brilinta [ticagrelor], Glipizide, and Tramadol   Review of Systems Review of Systems  Constitutional: Negative.   Respiratory: Negative.     Cardiovascular: Negative.   Gastrointestinal: Negative.   Skin:  Positive for rash. Negative for color change, pallor and wound.    Physical Exam Triage Vital Signs ED Triage Vitals  Enc Vitals Group     BP 01/21/21 1614 (!) 181/93     Pulse Rate 01/21/21 1614 92     Resp 01/21/21 1614 20     Temp 01/21/21 1614 98.5 F (36.9 C)     Temp Source 01/21/21 1614 Oral     SpO2 01/21/21 1614 94 %     Weight --      Height --      Head Circumference --      Peak Flow --      Pain Score 01/21/21 1611 0     Pain Loc --      Pain Edu? --      Excl. in GC? --    No data found.  Updated Vital Signs BP (!) 181/93 (BP Location: Left Arm)    Pulse 92    Temp 98.5 F (36.9 C) (Oral)    Resp 20    SpO2 94%   Visual Acuity Right Eye Distance:   Left Eye Distance:   Bilateral Distance:    Right Eye Near:   Left Eye Near:    Bilateral Near:     Physical Exam HENT:     Mouth/Throat:     Pharynx: Oropharynx is clear.  Eyes:     Extraocular Movements: Extraocular movements intact.  Pulmonary:     Effort: Pulmonary effort is normal.     Breath sounds: Normal breath sounds.  Musculoskeletal:     Cervical back: Normal range of motion and neck supple.  Skin:    Comments: Hives spread diffusely across bilateral arms, legs, abdomen, chest, with excoriations  Neurological:     Mental Status: She is oriented to person, place, and time. Mental status is at baseline.  Psychiatric:        Mood and Affect: Mood normal.        Behavior: Behavior normal.     UC Treatments / Results  Labs (all labs ordered are listed, but only abnormal results are displayed) Labs Reviewed - No data to display  EKG   Radiology No results found.  Procedures Procedures (including critical care time)  Medications Ordered in UC Medications - No data to display  Initial Impression / Assessment and Plan / UC Course  I have reviewed the triage vital signs and the nursing notes.  Pertinent labs &  imaging results that were available during my care of the patient were reviewed by me and considered in my medical decision making (see chart for details).  Hives  Patient in no signs of distress, vital signs stable, O2 saturation 94% on room air, visually throat is clear without swelling, discussed findings with patient, stable at this time  1.  Methylprednisolone 60 mg IM nail 2.  Prednisone 40 mg daily for 5 days, discussed adverse effect of elevated blood sugars, patient to monitor at home, last A1c 8.8 3.  Benadryl cream 3 times daily as needed to affected area, continue oral antihistamine as well 4.  Return precautions given for follow-up in urgent care for persistent rash Final Clinical Impressions(s) / UC Diagnoses   Final diagnoses:  None   Discharge Instructions   None    ED Prescriptions   None    PDMP not reviewed this encounter.   Hans Eden, NP 01/21/21 [phone]

## 2021-01-21 NOTE — Discharge Instructions (Signed)
The cause of your rash is unknown at this time based on what you have told me does not seem that you were exposed to any trigger however we will aim today to reduce your itchiness and get rid of the rash  Beginning tomorrow take prednisone every morning with food for 5 days, please be mindful that while you are on this medication your blood sugars will become elevated, they should return to normal over the next few days once you have stopped the medication  You may apply Benadryl cream up to 3 times a day over the rash area to help with the itching, you also may take over-the-counter Claritin or Zyrtec once a day to help with the itching  You may follow-up with urgent care for worsening or persistent rash as needed

## 2021-01-21 NOTE — ED Triage Notes (Signed)
2 days ago started having itchy rash.  Denies anything new.

## 2021-01-27 ENCOUNTER — Emergency Department (HOSPITAL_COMMUNITY): Payer: Medicaid Other

## 2021-01-27 ENCOUNTER — Encounter (HOSPITAL_COMMUNITY): Payer: Self-pay | Admitting: Emergency Medicine

## 2021-01-27 ENCOUNTER — Inpatient Hospital Stay (HOSPITAL_COMMUNITY)
Admission: EM | Admit: 2021-01-27 | Discharge: 2021-01-29 | DRG: 690 | Disposition: A | Discharge status: Home / Self Care | Payer: Medicaid Other | Attending: Student | Admitting: Student

## 2021-01-27 DIAGNOSIS — E860 Dehydration: Secondary | ICD-10-CM | POA: Diagnosis present

## 2021-01-27 DIAGNOSIS — T383X6A Underdosing of insulin and oral hypoglycemic [antidiabetic] drugs, initial encounter: Secondary | ICD-10-CM | POA: Diagnosis present

## 2021-01-27 DIAGNOSIS — Z833 Family history of diabetes mellitus: Secondary | ICD-10-CM

## 2021-01-27 DIAGNOSIS — E1165 Type 2 diabetes mellitus with hyperglycemia: Secondary | ICD-10-CM | POA: Diagnosis not present

## 2021-01-27 DIAGNOSIS — L509 Urticaria, unspecified: Secondary | ICD-10-CM

## 2021-01-27 DIAGNOSIS — L309 Dermatitis, unspecified: Secondary | ICD-10-CM | POA: Diagnosis present

## 2021-01-27 DIAGNOSIS — Z8673 Personal history of transient ischemic attack (TIA), and cerebral infarction without residual deficits: Secondary | ICD-10-CM | POA: Diagnosis not present

## 2021-01-27 DIAGNOSIS — Z888 Allergy status to other drugs, medicaments and biological substances status: Secondary | ICD-10-CM

## 2021-01-27 DIAGNOSIS — Z7902 Long term (current) use of antithrombotics/antiplatelets: Secondary | ICD-10-CM

## 2021-01-27 DIAGNOSIS — Z8249 Family history of ischemic heart disease and other diseases of the circulatory system: Secondary | ICD-10-CM

## 2021-01-27 DIAGNOSIS — N390 Urinary tract infection, site not specified: Secondary | ICD-10-CM | POA: Diagnosis present

## 2021-01-27 DIAGNOSIS — Z885 Allergy status to narcotic agent status: Secondary | ICD-10-CM

## 2021-01-27 DIAGNOSIS — Z794 Long term (current) use of insulin: Secondary | ICD-10-CM

## 2021-01-27 DIAGNOSIS — Z79899 Other long term (current) drug therapy: Secondary | ICD-10-CM

## 2021-01-27 DIAGNOSIS — Z91012 Allergy to eggs: Secondary | ICD-10-CM

## 2021-01-27 DIAGNOSIS — N179 Acute kidney failure, unspecified: Secondary | ICD-10-CM | POA: Diagnosis not present

## 2021-01-27 DIAGNOSIS — Z79890 Hormone replacement therapy (postmenopausal): Secondary | ICD-10-CM

## 2021-01-27 DIAGNOSIS — N3001 Acute cystitis with hematuria: Secondary | ICD-10-CM

## 2021-01-27 DIAGNOSIS — N3941 Urge incontinence: Secondary | ICD-10-CM | POA: Diagnosis present

## 2021-01-27 DIAGNOSIS — Y92009 Unspecified place in unspecified non-institutional (private) residence as the place of occurrence of the external cause: Secondary | ICD-10-CM

## 2021-01-27 DIAGNOSIS — Z9114 Patient's other noncompliance with medication regimen: Secondary | ICD-10-CM

## 2021-01-27 DIAGNOSIS — I10 Essential (primary) hypertension: Secondary | ICD-10-CM | POA: Diagnosis not present

## 2021-01-27 DIAGNOSIS — Z90710 Acquired absence of both cervix and uterus: Secondary | ICD-10-CM

## 2021-01-27 DIAGNOSIS — E1100 Type 2 diabetes mellitus with hyperosmolarity without nonketotic hyperglycemic-hyperosmolar coma (NKHHC): Secondary | ICD-10-CM

## 2021-01-27 DIAGNOSIS — Z20822 Contact with and (suspected) exposure to covid-19: Secondary | ICD-10-CM | POA: Diagnosis present

## 2021-01-27 DIAGNOSIS — Z7982 Long term (current) use of aspirin: Secondary | ICD-10-CM

## 2021-01-27 LAB — URINALYSIS, ROUTINE W REFLEX MICROSCOPIC
Bilirubin Urine: NEGATIVE
Glucose, UA: >=500 mg/dL — AB
Ketones, ur: NEGATIVE mg/dL
Nitrite: NEGATIVE
Protein, ur: 30 mg/dL — AB
RBC / HPF: >50 RBC/hpf — ABNORMAL HIGH (ref 0–5)
Specific Gravity, Urine: 1.023 (ref 1.005–1.030)
WBC, UA: >50 WBC/hpf — ABNORMAL HIGH (ref 0–5)
pH: 5 (ref 5.0–8.0)

## 2021-01-27 LAB — I-STAT VENOUS BLOOD GAS, ED
Acid-Base Excess: 1 mmol/L (ref 0.0–2.0)
Bicarbonate: 28.3 mmol/L — ABNORMAL HIGH (ref 20.0–28.0)
Calcium, Ion: 1.23 mmol/L (ref 1.15–1.40)
HCT: 42 % (ref 36.0–46.0)
Hemoglobin: 14.3 g/dL (ref 12.0–15.0)
O2 Saturation: 78 %
Potassium: 4.2 mmol/L (ref 3.5–5.1)
Sodium: 131 mmol/L — ABNORMAL LOW (ref 135–145)
TCO2: 30 mmol/L (ref 22–32)
pCO2, Ven: 52.6 mmHg (ref 44.0–60.0)
pH, Ven: 7.338 (ref 7.250–7.430)
pO2, Ven: 46 mmHg — ABNORMAL HIGH (ref 32.0–45.0)

## 2021-01-27 LAB — BASIC METABOLIC PANEL
Anion gap: 12 (ref 5–15)
Anion gap: 10 (ref 5–15)
BUN: 39 mg/dL — ABNORMAL HIGH (ref 6–20)
BUN: 39 mg/dL — ABNORMAL HIGH (ref 6–20)
CO2: 20 mmol/L — ABNORMAL LOW (ref 22–32)
CO2: 23 mmol/L (ref 22–32)
Calcium: 9.9 mg/dL (ref 8.9–10.3)
Calcium: 9.3 mg/dL (ref 8.9–10.3)
Chloride: 95 mmol/L — ABNORMAL LOW (ref 98–111)
Chloride: 94 mmol/L — ABNORMAL LOW (ref 98–111)
Creatinine, Ser: 2.05 mg/dL — ABNORMAL HIGH (ref 0.44–1.00)
Creatinine, Ser: 1.78 mg/dL — ABNORMAL HIGH (ref 0.44–1.00)
GFR, Estimated: 28 mL/min — ABNORMAL LOW (ref >60)
GFR, Estimated: 33 mL/min — ABNORMAL LOW (ref >60)
Glucose, Bld: 715 mg/dL (ref 70–99)
Glucose, Bld: 561 mg/dL (ref 70–99)
Potassium: 4.4 mmol/L (ref 3.5–5.1)
Potassium: 4.6 mmol/L (ref 3.5–5.1)
Sodium: 127 mmol/L — ABNORMAL LOW (ref 135–145)
Sodium: 127 mmol/L — ABNORMAL LOW (ref 135–145)

## 2021-01-27 LAB — TROPONIN I (HIGH SENSITIVITY)
Troponin I (High Sensitivity): 6 ng/L (ref <18)
Troponin I (High Sensitivity): 8 ng/L (ref <18)

## 2021-01-27 LAB — CBG MONITORING, ED
Glucose-Capillary: 514 mg/dL (ref 70–99)
Glucose-Capillary: >600 mg/dL (ref 70–99)
Glucose-Capillary: 351 mg/dL — ABNORMAL HIGH (ref 70–99)

## 2021-01-27 LAB — HEMOGLOBIN A1C
Hgb A1c MFr Bld: 11.6 % — ABNORMAL HIGH (ref 4.8–5.6)
Mean Plasma Glucose: 286.22 mg/dL

## 2021-01-27 LAB — CBC
HCT: 44.1 % (ref 36.0–46.0)
Hemoglobin: 14.8 g/dL (ref 12.0–15.0)
MCH: 27.7 pg (ref 26.0–34.0)
MCHC: 33.6 g/dL (ref 30.0–36.0)
MCV: 82.6 fL (ref 80.0–100.0)
Platelets: 314 10*3/uL (ref 150–400)
RBC: 5.34 MIL/uL — ABNORMAL HIGH (ref 3.87–5.11)
RDW: 12.9 % (ref 11.5–15.5)
WBC: 11.8 10*3/uL — ABNORMAL HIGH (ref 4.0–10.5)
nRBC: 0 % (ref 0.0–0.2)

## 2021-01-27 LAB — OSMOLALITY: Osmolality: 320 mOsm/kg — ABNORMAL HIGH (ref 275–295)

## 2021-01-27 LAB — RESP PANEL BY RT-PCR (FLU A&B, COVID) ARPGX2
Influenza A by PCR: NEGATIVE
Influenza B by PCR: NEGATIVE
SARS Coronavirus 2 by RT PCR: NEGATIVE

## 2021-01-27 LAB — BETA-HYDROXYBUTYRIC ACID: Beta-Hydroxybutyric Acid: 0.32 mmol/L — ABNORMAL HIGH (ref 0.05–0.27)

## 2021-01-27 LAB — LACTIC ACID, PLASMA
Lactic Acid, Venous: 1.7 mmol/L (ref 0.5–1.9)
Lactic Acid, Venous: 2.3 mmol/L (ref 0.5–1.9)

## 2021-01-27 MED ORDER — INSULIN ASPART 100 UNIT/ML IJ SOLN
0.0000 [IU] | Freq: Three times a day (TID) | INTRAMUSCULAR | Status: DC
Start: 2021-01-27 — End: 2021-01-29
  Administered 2021-01-27: 18:00:00 10 [IU] via SUBCUTANEOUS
  Administered 2021-01-28: 08:00:00 5 [IU] via SUBCUTANEOUS
  Administered 2021-01-28: 12:00:00 8 [IU] via SUBCUTANEOUS
  Administered 2021-01-28: 17:00:00 5 [IU] via SUBCUTANEOUS

## 2021-01-27 MED ORDER — DOCUSATE SODIUM 100 MG PO CAPS
100.0000 mg | ORAL_CAPSULE | Freq: Two times a day (BID) | ORAL | Status: DC
  Administered 2021-01-27 – 2021-01-29 (×4): 100 mg via ORAL
  Filled 2021-01-27 (×4): qty 1

## 2021-01-27 MED ORDER — BISACODYL 5 MG PO TBEC: 5.0000 mg | DELAYED_RELEASE_TABLET | Freq: Every day | ORAL | Status: DC | PRN

## 2021-01-27 MED ORDER — ENOXAPARIN SODIUM 40 MG/0.4ML IJ SOSY
40.0000 mg | PREFILLED_SYRINGE | INTRAMUSCULAR | Status: DC
  Administered 2021-01-27 – 2021-01-28 (×2): 40 mg via SUBCUTANEOUS
  Filled 2021-01-27 (×2): qty 0.4

## 2021-01-27 MED ORDER — AMLODIPINE BESYLATE 2.5 MG PO TABS
2.5000 mg | ORAL_TABLET | Freq: Every day | ORAL | Status: DC
  Administered 2021-01-27 – 2021-01-28 (×2): 2.5 mg via ORAL
  Filled 2021-01-27 (×2): qty 1

## 2021-01-27 MED ORDER — HYDRALAZINE HCL 20 MG/ML IJ SOLN
5.0000 mg | INTRAMUSCULAR | Status: DC | PRN
  Administered 2021-01-27: 18:00:00 5 mg via INTRAVENOUS
  Filled 2021-01-27: qty 1

## 2021-01-27 MED ORDER — SODIUM CHLORIDE 0.9 % IV BOLUS: 1000.0000 mL | Freq: Once | INTRAVENOUS | Status: DC

## 2021-01-27 MED ORDER — ONDANSETRON HCL 4 MG PO TABS: 4.0000 mg | ORAL_TABLET | Freq: Four times a day (QID) | ORAL | Status: DC | PRN

## 2021-01-27 MED ORDER — POLYETHYLENE GLYCOL 3350 17 G PO PACK: 17.0000 g | PACK | Freq: Every day | ORAL | Status: DC | PRN

## 2021-01-27 MED ORDER — INSULIN DETEMIR 100 UNIT/ML ~~LOC~~ SOLN
30.0000 [IU] | Freq: Every day | SUBCUTANEOUS | Status: DC
  Administered 2021-01-27: 18:00:00 30 [IU] via SUBCUTANEOUS
  Filled 2021-01-27 (×2): qty 0.3

## 2021-01-27 MED ORDER — LACTATED RINGERS IV BOLUS
2000.0000 mL | INTRAVENOUS | Status: AC
  Administered 2021-01-27: 13:00:00 2000 mL via INTRAVENOUS

## 2021-01-27 MED ORDER — ACETAMINOPHEN 325 MG PO TABS: 650.0000 mg | ORAL_TABLET | Freq: Four times a day (QID) | ORAL | Status: DC | PRN

## 2021-01-27 MED ORDER — LORATADINE 10 MG PO TABS
10.0000 mg | ORAL_TABLET | Freq: Every day | ORAL | Status: DC
Start: 2021-01-27 — End: 2021-01-29
  Administered 2021-01-27 – 2021-01-29 (×3): 10 mg via ORAL
  Filled 2021-01-27 (×3): qty 1

## 2021-01-27 MED ORDER — ASPIRIN EC 81 MG PO TBEC
81.0000 mg | DELAYED_RELEASE_TABLET | Freq: Every day | ORAL | Status: DC
  Administered 2021-01-28 – 2021-01-29 (×2): 81 mg via ORAL
  Filled 2021-01-27 (×2): qty 1

## 2021-01-27 MED ORDER — LACTATED RINGERS IV BOLUS: 2000.0000 mL | INTRAVENOUS | Status: DC

## 2021-01-27 MED ORDER — INSULIN REGULAR(HUMAN) IN NACL 100-0.9 UT/100ML-% IV SOLN: INTRAVENOUS | Status: DC

## 2021-01-27 MED ORDER — POTASSIUM CHLORIDE 10 MEQ/100ML IV SOLN: 10.0000 meq | INTRAVENOUS | Status: DC

## 2021-01-27 MED ORDER — ONDANSETRON HCL 4 MG/2ML IJ SOLN: 4.0000 mg | Freq: Four times a day (QID) | INTRAMUSCULAR | Status: DC | PRN

## 2021-01-27 MED ORDER — ATORVASTATIN CALCIUM 80 MG PO TABS
80.0000 mg | ORAL_TABLET | Freq: Every day | ORAL | Status: DC
  Administered 2021-01-27 – 2021-01-28 (×2): 80 mg via ORAL
  Filled 2021-01-27 (×2): qty 1

## 2021-01-27 MED ORDER — SODIUM CHLORIDE 0.9 % IV SOLN: 2.0000 g | Freq: Once | INTRAVENOUS | Status: DC

## 2021-01-27 MED ORDER — DEXTROSE IN LACTATED RINGERS 5 % IV SOLN: INTRAVENOUS | Status: DC

## 2021-01-27 MED ORDER — LACTATED RINGERS IV BOLUS
1000.0000 mL | INTRAVENOUS | Status: AC
  Administered 2021-01-27: 17:00:00 1000 mL via INTRAVENOUS

## 2021-01-27 MED ORDER — ACETAMINOPHEN 650 MG RE SUPP: 650.0000 mg | Freq: Four times a day (QID) | RECTAL | Status: DC | PRN

## 2021-01-27 MED ORDER — CLOPIDOGREL BISULFATE 75 MG PO TABS
75.0000 mg | ORAL_TABLET | Freq: Every day | ORAL | Status: DC
  Administered 2021-01-28 – 2021-01-29 (×2): 75 mg via ORAL
  Filled 2021-01-27 (×2): qty 1

## 2021-01-27 MED ORDER — DEXTROSE 50 % IV SOLN: 0.0000 mL | INTRAVENOUS | Status: DC | PRN

## 2021-01-27 MED ORDER — LACTATED RINGERS IV BOLUS
2000.0000 mL | Freq: Once | INTRAVENOUS | Status: AC
  Administered 2021-01-27: 17:00:00 2000 mL via INTRAVENOUS

## 2021-01-27 MED ORDER — LACTATED RINGERS IV SOLN: INTRAVENOUS | Status: DC

## 2021-01-27 MED ORDER — SODIUM CHLORIDE 0.9 % IV SOLN
1.0000 g | INTRAVENOUS | Status: DC
  Administered 2021-01-27: 18:00:00 1 g via INTRAVENOUS
  Filled 2021-01-27 (×2): qty 10

## 2021-01-27 MED ORDER — INSULIN ASPART 100 UNIT/ML IJ SOLN
0.0000 [IU] | Freq: Every day | INTRAMUSCULAR | Status: DC
  Administered 2021-01-27: 22:00:00 5 [IU] via SUBCUTANEOUS

## 2021-01-27 MED ORDER — FAMOTIDINE 20 MG PO TABS
40.0000 mg | ORAL_TABLET | Freq: Two times a day (BID) | ORAL | Status: DC
Start: 2021-01-27 — End: 2021-01-29
  Administered 2021-01-27 – 2021-01-29 (×4): 40 mg via ORAL
  Filled 2021-01-27 (×5): qty 2

## 2021-01-27 MED ORDER — INSULIN ASPART 100 UNIT/ML IJ SOLN
4.0000 [IU] | Freq: Three times a day (TID) | INTRAMUSCULAR | Status: DC
  Administered 2021-01-27 – 2021-01-28 (×3): 4 [IU] via SUBCUTANEOUS

## 2021-01-27 NOTE — Assessment & Plan Note (Signed)
-  She was seen for this and given steroids and benadryl cream -Will d/c these and instead give Claritin and Pepcid

## 2021-01-27 NOTE — Assessment & Plan Note (Signed)
-  Ramifications of not taking her chronic controlling medications needs to be reviewed, as she is at very high risk of complications

## 2021-01-27 NOTE — ED Provider Notes (Signed)
Emergency Medicine Provider Triage Evaluation Note  Crystal Boyer , a 60 y.o. female  was evaluated in triage.  Pt complains of dizziness and blurred vision starting this morning. Patient was seen at urgent care recently for generalized itching and was prescribed clopidogrel and prednisone. This morning endorses new dizziness and vision changes started 2 hours prior to arrival without resolution.   Review of Systems  Positive: Dizziness, blurred vision Negative: Headache, slurred speech, weakness, n/v  Physical Exam  BP 124/76 (BP Location: Right Arm)    Pulse (!) 101    Temp (!) 97.5 F (36.4 C)    Resp 16    SpO2 96%  Gen:   Awake, no distress   Resp:  Normal effort  MSK:   Moves extremities without difficulty  Other:  Target lesion on right forearm; grip strength symmetrical, EOM intact   Medical Decision Making  Medically screening exam initiated at 10:17 AM.  Appropriate orders placed.  Crystal Boyer was informed that the remainder of the evaluation will be completed by another provider, this initial triage assessment does not replace that evaluation, and the importance of remaining in the ED until their evaluation is complete.     Janell Quiet, [place] 01/27/21 1019    Gloris Manchester, MD 01/27/21 2206

## 2021-01-27 NOTE — ED Notes (Signed)
Rae Mar), son, [phone] would like an update when available

## 2021-01-27 NOTE — Assessment & Plan Note (Signed)
-  Continue home Norvasc -Hold Cozaar given AKI -Will cover with prn IV hydralazine

## 2021-01-27 NOTE — ED Notes (Signed)
Dr.Yates notified of greater than 600 blood glucose. MD ordered 10 units Novolog insulin plus the scheduled 4 units Novolog insulin at this time. Pt was also given the 30 units Lantus. Will continue to monitor.

## 2021-01-27 NOTE — Assessment & Plan Note (Addendum)
-  Patient with blockage, needs endovascular revascularization -She no-showed in September and had uncontrolled HTN in November so has not yet had procedure yet -She is at high risk for repeat stroke and yet is not consistently taking medications -Resume ASA and Plavix and encourage compliance -LDL was 128 at last check and she was previously taking Lipitor but this does not appear to be on her current list; it was resumed

## 2021-01-27 NOTE — Assessment & Plan Note (Signed)
-  Patient presenting with marked hyperglycemia in the setting of medication noncompliance -She reports that she ran out of insulin a week ago - although she was seen at [facility] within the week and did not appear to report this -She does not have DKA - normal anion gap, normal CO2 -Suspect significant dehydration; she was given 2L bolus in the ER and will repeat this and then run LR at 125 cc/hr -Will give home Lantus 30 units now -Will cover with moderate-scale SSI including meal and qhs coverage -Anticipate improvement with dc to home tomorrow -Will observe overnight on Med Surg

## 2021-01-27 NOTE — Progress Notes (Signed)
Inpatient Diabetes Program Recommendations  AACE/ADA: New Consensus Statement on Inpatient Glycemic Control (2015)  Target Ranges:  Prepandial:   less than 140 mg/dL      Peak postprandial:   less than 180 mg/dL (1-2 hours)      Critically ill patients:  140 - 180 mg/dL   Lab Results  Component Value Date   GLUCAP 185 (H) 12/23/2020   HGBA1C 8.2 (H) 05/01/2020    Review of Glycemic Control  Latest Reference Range & Units 01/27/21 10:08  Glucose 70 - 99 mg/dL 208 (HH)   Diabetes history: DM 2 Outpatient Diabetes medications:  Lantus 30 units q HS, Prednisone 40 mg daily Current orders for Inpatient glycemic control:  None  Inpatient Diabetes Program Recommendations:    Note that patient was placed on Prednisone on 12/15 for "generalized itching".  Likely will need insulin drip order set.    Will follow.   Thanks,  Beryl Meager, RN, BC-ADM Inpatient Diabetes Coordinator Pager [phone]  (8a-5p)

## 2021-01-27 NOTE — Assessment & Plan Note (Addendum)
-  Dehydration due to severe hyperglycemia -This appears to already be improving -Will recheck BMP in AM -Hold Cozaar and avoid other nephrotoxic medications for now

## 2021-01-27 NOTE — ED Notes (Signed)
Lab called to advise pt glucose is 561

## 2021-01-27 NOTE — ED Triage Notes (Signed)
Pt arrives via EMS from work with reports of possible reaction to prednisone. Pt started on it due to being itchy for a few days. Pt has been on prednisone since Saturday. Pt endorses HA and dizziness.

## 2021-01-27 NOTE — Assessment & Plan Note (Signed)
-  Symptoms include urinary frequency, urgency, incontinence, and dysuria -UA is suspicious for UTI -Culture pending -Will treat for now with Rocephin

## 2021-01-27 NOTE — H&P (Addendum)
History and Physical    DAVITA SUBLETT GHW:[id number] DOB: 1960-12-28 DOA: 01/27/2021  PCP: Group, [facility]:  Eustaquio Boyden - neurology Patient coming from:  Home - lives alone; NOK: Daughter, Kathyann Spaugh, [phone]  Chief Complaint: Dizziness, itching  HPI: Crystal Boyer is a 60 y.o. female with medical history significant of DM; HTN; and recent CVA presenting with dizziness and itching. She developed hives which were pruritic and on her arms and back primarily and was seen for this at Urgent Care on 12/15.  She denied medication changes or other issues.  She was given prednisone for 5 days in addition to IM methylprednisolone and benadryl cream.  Concurrently, she also ran out of her insulin about a week ago and hasn't had any.   She previously had been scheduled for cerebral angiogram with endovascular revascularization - patient cancelled on 9/28, came on 11/16 but had been out of BP meds for over a week and was too hypertensive for the procedure. At her appointment on 12/15, her Plavix was resumed (she is supposed to be taking both ASA and Plavix and is to call for appt for procedure once her BP is stabilized).  Since her 12/15 appointment, she has been feeling more lightheaded and also had urinary urgency and incontinence.  She is having mild dysuria.  The hives are improved but still present.   ED Course: Not taking insulin, has been taking prednisone.  Glucose 715, no DKA.  Given 2L, glucose improved to 514.  Appears to have UTI, giving Rocephin.  Feeling better, lactate up.  Review of Systems: As per HPI; otherwise review of systems reviewed and negative.   Ambulatory Status:  Ambulates without assistance  COVID Vaccine Status:  Complete plus boosters  Past Medical History:  Diagnosis Date   Allergy    Anemia    Asthma    Depression    Diabetes mellitus    Gout 12 days ago   Hypertension    Stroke [facility])     Past Surgical History:   Procedure Laterality Date   ABDOMINAL HYSTERECTOMY     ANKLE FRACTURE SURGERY     right ankle   CESAREAN SECTION     IR ANGIO INTRA EXTRACRAN SEL COM CAROTID INNOMINATE BILAT MOD SED  07/07/2020   IR ANGIO VERTEBRAL SEL VERTEBRAL UNI R MOD SED  07/07/2020   IR RADIOLOGIST EVAL & MGMT  06/19/2020   IR RADIOLOGIST EVAL & MGMT  09/22/2020   IR US GUIDE VASC ACCESS RIGHT  07/07/2020    Social History   Socioeconomic History   Marital status: Single    Spouse name: Not on file   Number of children: 3   Years of education: Not on file   Highest education level: Not on file  Occupational History    Comment: preschool teacher  Tobacco Use   Smoking status: Never   Smokeless tobacco: Never  Vaping Use   Vaping Use: Never used  Substance and Sexual Activity   Alcohol use: Yes    Comment: social   Drug use: Not Currently    Types: Marijuana    Comment: 1x monthly   Sexual activity: Yes    Birth control/protection: Condom  Other Topics Concern   Not on file  Social History Narrative   Lives alone   Social Determinants of Health   Financial Resource Strain: Not on file  Food Insecurity: Not on file  Transportation Needs: Not on file  Physical Activity: Not  on file  Stress: Not on file  Social Connections: Not on file  Intimate Partner Violence: Not on file    Allergies  Allergen Reactions   Eggs Or Egg-Derived Products Diarrhea   Actos [Pioglitazone] Other (See Comments)    Made the patient feel like she was "going to die"   Brilinta [Ticagrelor]     Shortness of breath    Glipizide Other (See Comments)    Made patient feel like she was "going to pass out and die"   Tramadol Rash    Family History  Problem Relation Age of Onset   Hypertension Mother    Diabetes Mother    Hypertension Father    Diabetes Father    Colon cancer Neg Hx     Prior to Admission medications   Medication Sig Start Date End Date Taking? Authorizing Provider  amitriptyline (ELAVIL) 25  MG tablet Take 1 tablet (25 mg total) by mouth at bedtime. Patient not taking: Reported on 01/21/2021 09/02/20   Frann Rider, NP  amLODipine (NORVASC) 2.5 MG tablet Take 1 tablet (2.5 mg total) by mouth daily. Patient taking differently: Take 2.5 mg by mouth at bedtime. 06/03/20   Frann Rider, NP  aspirin EC 81 MG EC tablet Take 1 tablet (81 mg total) by mouth daily. Swallow whole. 05/02/20   Marianna Payment, MD  atorvastatin (LIPITOR) 80 MG tablet Take 1 tablet (80 mg total) by mouth daily. Patient taking differently: Take 80 mg by mouth at bedtime. 09/02/20 02/13/21  Frann Rider, NP  clopidogrel (PLAVIX) 75 MG tablet Take 1 tablet (75 mg total) by mouth daily. 09/22/20   Han, Aimee H, PA-C  diphenhydrAMINE-zinc acetate (BENADRYL) cream Apply 1 application topically 3 (three) times daily as needed for itching. 01/21/21   Hans Eden, NP  ferrous sulfate 324 MG TBEC Take 324 mg by mouth daily with breakfast.    [provider]  LANTUS SOLOSTAR 100 UNIT/ML Solostar Pen Inject 30 Units into the skin at bedtime. 05/01/20   Marianna Payment, MD  losartan (COZAAR) 50 MG tablet Take 50 mg by mouth daily.    [provider]  Multiple Vitamins-Minerals (ZINC PO) Take 1 tablet by mouth at bedtime.    [provider]  predniSONE (DELTASONE) 20 MG tablet Take 2 tablets (40 mg total) by mouth daily. 01/21/21   White, Leitha Schuller, NP  albuterol (PROVENTIL HFA;VENTOLIN HFA) 108 (90 Base) MCG/ACT inhaler Inhale 2 puffs into the lungs every 6 (six) hours as needed for wheezing or shortness of breath. Patient not taking: Reported on 07/03/2018 04/24/18 01/13/19  Harrie Foreman, MD    Physical Exam: Vitals:   01/27/21 0951 01/27/21 1318 01/27/21 1522 01/27/21 1736  BP: 124/76 132/85 (!) 133/95 (!) 184/74  Pulse: (!) 101 85 71 81  Resp: 16 15 15 13   Temp: (!) 97.5 F (36.4 C)     SpO2: 96% 96% 96% 98%     General:  Appears calm and comfortable and is in NAD Eyes:  PERRL,  EOMI, normal lids, iris ENT:  grossly normal hearing, lips & tongue, mmm Neck:  no LAD, masses or thyromegaly Cardiovascular:  RRR, no m/r/g. Painful 1+ LE edema.  Respiratory:   CTA bilaterally with no wheezes/rales/rhonchi.  Normal respiratory effort. Abdomen:  soft, +suprapubic TTP, ND Skin:  no rash or induration seen on limited exam Musculoskeletal:  grossly normal tone BUE/BLE, good ROM, no bony abnormality Psychiatric:  grossly normal mood and affect, speech fluent and appropriate, AOx3  Neurologic:  CN 2-12 grossly intact, moves all extremities in coordinated fashion    Radiological Exams on Admission: Independently reviewed - see discussion in A/P where applicable  CT Head Wo Contrast  Result Date: 01/27/2021 CLINICAL DATA:  Mental status change EXAM: CT HEAD WITHOUT CONTRAST TECHNIQUE: Contiguous axial images were obtained from the base of the skull through the vertex without intravenous contrast. COMPARISON:  Head CT dated April 30, 2020 FINDINGS: Brain: Chronic white matter ischemic change. No evidence of acute infarction, hemorrhage, hydrocephalus, extra-axial collection or mass lesion/mass effect. Vascular: No hyperdense vessel or unexpected calcification. Skull: Normal. Negative for fracture or focal lesion. Sinuses/Orbits: No acute finding. Other: None. IMPRESSION: No acute intracranial abnormality. Electronically Signed   By: Yetta Glassman M.D.   On: 01/27/2021 10:49    EKG: Independently reviewed.  NSR with rate 94; nonspecific ST changes with no evidence of acute ischemia   Labs on Admission: I have personally reviewed the available labs and imaging studies at the time of the admission.  Pertinent labs:   Na++ 127 CO2 20 Glucose 715 -> 514 BUN 39/Creatinine 2.05/GFR 28; 14/0.93/>60 on 11/16 Normal anion gap Lactate 1.7 -> 2.3 HS troponin 6, 8 WBC 11.8 Beta-hydroxybutyrate 0.32 UA: >500 glucose, moderate Hgb, large LE, 30 protein, rare bacteria, >50  WBC/RBC Urine culture pending VBG: 7.338/52.6/28.3   Assessment/Plan Principal Problem:   Uncontrolled diabetes mellitus with hyperglycemia (HCC) Active Problems:   AKI (acute kidney injury) ([facility])   Acute lower UTI   Essential hypertension   History of CVA (cerebrovascular accident)   Hives   Noncompliance with medications   * Uncontrolled diabetes mellitus with hyperglycemia ([place]) -Patient presenting with marked hyperglycemia in the setting of medication noncompliance -She reports that she ran out of insulin a week ago - although she was seen at [facility] within the week and did not appear to report this -She does not have DKA - normal anion gap, normal CO2 -Suspect significant dehydration; she was given 2L bolus in the ER and will repeat this and then run LR at 125 cc/hr -Will give home Lantus 30 units now -Will cover with moderate-scale SSI including meal and qhs coverage -Anticipate improvement with dc to home tomorrow -Will observe overnight on Med Surg  AKI (acute kidney injury) ([place]) -Dehydration due to severe hyperglycemia -This appears to already be improving -Will recheck BMP in AM -Hold Cozaar and avoid other nephrotoxic medications for now  Acute lower UTI -Symptoms include urinary frequency, urgency, incontinence, and dysuria -UA is suspicious for UTI -Culture pending -Will treat for now with Rocephin  Noncompliance with medications -Ramifications of not taking her chronic controlling medications needs to be reviewed, as she is at very high risk of complications  Hives -She was seen for this and given steroids and benadryl cream -Will d/c these and instead give Claritin and Pepcid  History of CVA (cerebrovascular accident) -Patient with blockage, needs endovascular revascularization -She no-showed in September and had uncontrolled HTN in November so has not yet had procedure yet -She is at high risk for repeat stroke and yet is not consistently taking  medications -Resume ASA and Plavix and encourage compliance -LDL was 128 at last check and she was previously taking Lipitor but this does not appear to be on her current list; it was resumed  Essential hypertension -Continue home Norvasc -Hold Cozaar given AKI -Will cover with prn IV hydralazine      Note: This patient has been tested and is pending for the novel  coronavirus COVID-19. The patient has been fully vaccinated against COVID-19.   Level of care: Med-Surg DVT prophylaxis:  Lovenox  Code Status:  Full - confirmed with patient Family Communication: None present Disposition Plan:  The patient is from: home  Anticipated d/c is to: home without [facility] services   Anticipated d/c date will depend on clinical response to treatment, but possibly as early as tomorrow if she has excellent response to treatment  Patient is currently: acutely ill Consults called: Nutrition; Diabetes coordinator  Admission status:  It is my clinical opinion that referral for OBSERVATION is reasonable and necessary in this patient based on the above information provided. The aforementioned taken together are felt to place the patient at high risk for further clinical deterioration. However it is anticipated that the patient may be medically stable for discharge from the hospital within 24 to 48 hours.    Karmen Bongo MD [facility]   How to contact the [facility] Attending or Consulting provider [place] or covering provider during after hours [place], for this patient?  Check the care team in [facility] and look for a) attending/consulting [facility] provider listed and b) the [facility] team listed Log into [email or web address] and use [place]'s universal password to access. If you do not have the password, please contact the hospital operator. Locate the [facility] provider you are looking for under [facility] and page to a number that you can be directly reached. If you still have difficulty reaching the provider, please  page the [facility] (Director on Call) for the Hospitalists listed on amion for assistance.   01/27/2021, 6:19 PM

## 2021-01-27 NOTE — ED Provider Notes (Signed)
[facility] EMERGENCY DEPARTMENT Provider Note   CSN: [id number] Arrival date & time: 01/27/21  [id number]     History Chief Complaint  Patient presents with   Dizziness    Crystal Boyer is a 60 y.o. female with history significant for diabetes on glucagon and metformin presents to the ED for evaluation of dizziness, weakness.  Patient states that she was seen in urgent care on 01/21/2021 for evaluation of pruritic hives across her body and was prescribed Benadryl cream and a course of steroids.  Itching has not improved and has worsened to sores on her back and legs.  Additionally she admits that she ran out of her metformin and insulin 1 week ago but has not taken it since then.  No alleviating or aggravating factors.  She also endorses polyuria, polydipsia, blurred vision and nausea.    Dizziness Associated symptoms: nausea and weakness   Associated symptoms: no diarrhea, no headaches, no shortness of breath and no vomiting       Past Medical History:  Diagnosis Date   Allergy    Anemia    Asthma    Depression    Diabetes mellitus    Gout 12 days ago   Hypertension    Stroke [facility])     Patient Active Problem List   Diagnosis Date Noted   CVA (cerebral vascular accident) ([place]) 04/30/2020   Achilles tendon contracture, left 09/25/2017   Posterior tibial tendon dysfunction (PTTD) of right lower extremity 09/25/2017   DIABETES MELLITUS, TYPE II 11/27/2006   HYPERTENSION 11/27/2006   MICROALBUMINURIA 11/27/2006    Past Surgical History:  Procedure Laterality Date   ABDOMINAL HYSTERECTOMY     ANKLE FRACTURE SURGERY     right ankle   CESAREAN SECTION     IR ANGIO INTRA EXTRACRAN SEL COM CAROTID INNOMINATE BILAT MOD SED  07/07/2020   IR ANGIO VERTEBRAL SEL VERTEBRAL UNI R MOD SED  07/07/2020   IR RADIOLOGIST EVAL & MGMT  06/19/2020   IR RADIOLOGIST EVAL & MGMT  09/22/2020   IR US GUIDE VASC ACCESS RIGHT  07/07/2020     OB History   No obstetric  history on file.     Family History  Problem Relation Age of Onset   Hypertension Mother    Diabetes Mother    Hypertension Father    Diabetes Father    Colon cancer Neg Hx     Social History   Tobacco Use   Smoking status: Never   Smokeless tobacco: Never  Vaping Use   Vaping Use: Never used  Substance Use Topics   Alcohol use: No    Alcohol/week: 0.0 standard drinks    Comment: social   Drug use: Yes    Types: Marijuana    Comment: 1x monthly    Home Medications Prior to Admission medications   Medication Sig Start Date End Date Taking? Authorizing Provider  amitriptyline (ELAVIL) 25 MG tablet Take 1 tablet (25 mg total) by mouth at bedtime. Patient not taking: Reported on 01/21/2021 09/02/20   Frann Rider, NP  amLODipine (NORVASC) 2.5 MG tablet Take 1 tablet (2.5 mg total) by mouth daily. Patient taking differently: Take 2.5 mg by mouth at bedtime. 06/03/20   Frann Rider, NP  aspirin EC 81 MG EC tablet Take 1 tablet (81 mg total) by mouth daily. Swallow whole. 05/02/20   Marianna Payment, MD  atorvastatin (LIPITOR) 80 MG tablet Take 1 tablet (80 mg total) by mouth daily. Patient taking differently:  Take 80 mg by mouth at bedtime. 09/02/20 02/13/21  Ihor Austin, NP  clopidogrel (PLAVIX) 75 MG tablet Take 1 tablet (75 mg total) by mouth daily. 09/22/20   Han, Aimee H, PA-C  diphenhydrAMINE-zinc acetate (BENADRYL) cream Apply 1 application topically 3 (three) times daily as needed for itching. 01/21/21   Valinda Hoar, NP  ferrous sulfate 324 MG TBEC Take 324 mg by mouth daily with breakfast.    [provider]  LANTUS SOLOSTAR 100 UNIT/ML Solostar Pen Inject 30 Units into the skin at bedtime. 05/01/20   Dellia Cloud, MD  losartan (COZAAR) 50 MG tablet Take 50 mg by mouth daily.    [provider]  Multiple Vitamins-Minerals (ZINC PO) Take 1 tablet by mouth at bedtime.    [provider]  predniSONE (DELTASONE) 20 MG tablet Take 2 tablets (40  mg total) by mouth daily. 01/21/21   White, Elita Boone, NP  albuterol (PROVENTIL HFA;VENTOLIN HFA) 108 (90 Base) MCG/ACT inhaler Inhale 2 puffs into the lungs every 6 (six) hours as needed for wheezing or shortness of breath. Patient not taking: Reported on 07/03/2018 04/24/18 01/13/19  Arnaldo Natal, MD    Allergies    Eggs or egg-derived products, Actos [pioglitazone], Brilinta [ticagrelor], Glipizide, and Tramadol  Review of Systems   Review of Systems  Constitutional:  Negative for fever.  HENT: Negative.    Eyes:  Positive for visual disturbance.  Respiratory:  Negative for shortness of breath.   Cardiovascular: Negative.   Gastrointestinal:  Positive for nausea. Negative for abdominal pain, diarrhea and vomiting.  Endocrine: Positive for polydipsia and polyuria.  Genitourinary: Negative.   Musculoskeletal: Negative.   Skin:  Negative for rash.  Neurological:  Positive for dizziness and weakness. Negative for headaches.  All other systems reviewed and are negative.  Physical Exam Updated Vital Signs BP 124/76 (BP Location: Right Arm)    Pulse (!) 101    Temp (!) 97.5 F (36.4 C)    Resp 16    SpO2 96%   Physical Exam Vitals and nursing note reviewed.  Constitutional:      General: She is not in acute distress.    Appearance: She is ill-appearing.  HENT:     Head: Atraumatic.     Mouth/Throat:     Pharynx: Oropharynx is clear.  Eyes:     Extraocular Movements: Extraocular movements intact.     Conjunctiva/sclera: Conjunctivae normal.     Pupils: Pupils are equal, round, and reactive to light.     Comments: Attempted to test visual acuity, however results of test are equivocal as patient had trouble following directions to not look directly at my hands in her peripheral.  Cardiovascular:     Rate and Rhythm: Normal rate and regular rhythm.     Pulses: Normal pulses.     Heart sounds: No murmur heard. Pulmonary:     Effort: Pulmonary effort is normal. No respiratory  distress.     Breath sounds: Normal breath sounds.     Comments: Lung clear to ausculation bilaterally. No tachypnea, no accessory muscle use, no acute distress, no increased work of breathing, no decrease in air movement  Abdominal:     General: Abdomen is flat. There is no distension.     Palpations: Abdomen is soft.     Tenderness: There is no abdominal tenderness.     Comments: Abdomen soft, nondistended, nontender to palpation in all quadrants without guarding or peritoneal signs   Musculoskeletal:  General: Normal range of motion.     Cervical back: Normal range of motion.  Skin:    General: Skin is warm and dry.     Capillary Refill: Capillary refill takes less than 2 seconds.     Comments: There is an approximately centimeter annular rash on the patient's right wrist with central clearing and scabbing.  Picture attached to chart.  Neurological:     General: No focal deficit present.     Mental Status: She is alert.     Comments: Speech is clear, able to follow commands CN III-XII intact Normal strength in upper and lower extremities bilaterally including dorsiflexion and plantar flexion, strong and equal grip strength Sensation normal to light and sharp touch Moves extremities without ataxia, coordination intact Normal finger to nose and rapid alternating movements No pronator drift    Psychiatric:        Mood and Affect: Mood normal.     ED Results / Procedures / Treatments   Labs (all labs ordered are listed, but only abnormal results are displayed) Labs Reviewed  BASIC METABOLIC PANEL - Abnormal; Notable for the following components:      Result Value   Sodium 127 (*)    Chloride 95 (*)    CO2 20 (*)    Glucose, Bld 715 (*)    BUN 39 (*)    Creatinine, Ser 2.05 (*)    GFR, Estimated 28 (*)    All other components within normal limits  CBC - Abnormal; Notable for the following components:   WBC 11.8 (*)    RBC 5.34 (*)    All other components within  normal limits  URINALYSIS, ROUTINE W REFLEX MICROSCOPIC - Abnormal; Notable for the following components:   APPearance CLOUDY (*)    Glucose, UA >=500 (*)    Hgb urine dipstick MODERATE (*)    Protein, ur 30 (*)    Leukocytes,Ua LARGE (*)    RBC / HPF >50 (*)    WBC, UA >50 (*)    Bacteria, UA RARE (*)    All other components within normal limits  LACTIC ACID, PLASMA  LACTIC ACID, PLASMA  BETA-HYDROXYBUTYRIC ACID  OSMOLALITY  CBG MONITORING, ED  TROPONIN I (HIGH SENSITIVITY)    EKG EKG Interpretation  Date/Time:  Wednesday January 27 2021 09:58:20 EST Ventricular Rate:  94 PR Interval:  138 QRS Duration: 88 QT Interval:  340 QTC Calculation: 425 R Axis:   5 Text Interpretation: Normal sinus rhythm Minimal voltage criteria for LVH, may be normal variant ( R in aVL ) Nonspecific ST and T wave abnormality Abnormal ECG Confirmed by Gloris Manchesterixon, Ryan [id number]) on 01/27/2021 12:34:09 PM  Radiology CT Head Wo Contrast  Result Date: 01/27/2021 CLINICAL DATA:  Mental status change EXAM: CT HEAD WITHOUT CONTRAST TECHNIQUE: Contiguous axial images were obtained from the base of the skull through the vertex without intravenous contrast. COMPARISON:  Head CT dated April 30, 2020 FINDINGS: Brain: Chronic white matter ischemic change. No evidence of acute infarction, hemorrhage, hydrocephalus, extra-axial collection or mass lesion/mass effect. Vascular: No hyperdense vessel or unexpected calcification. Skull: Normal. Negative for fracture or focal lesion. Sinuses/Orbits: No acute finding. Other: None. IMPRESSION: No acute intracranial abnormality. Electronically Signed   By: Allegra LaiLeah  Strickland M.D.   On: 01/27/2021 10:49    Procedures Procedures   Medications Ordered in ED Medications  lactated ringers bolus 2,000 mL (has no administration in time range)    ED Course  I have reviewed the  triage vital signs and the nursing notes.  Pertinent labs & imaging results that were available during my  care of the patient were reviewed by me and considered in my medical decision making (see chart for details).    MDM Rules/Calculators/A&P                         This patient presents to the ED for concern of hyperglycemia, this involves an extensive number of treatment options, and is a complaint that carries with it a high risk of complications and morbidity.  The differential diagnosis includes DKA, HHS, sepsis, nonketotic hyperglycemia  Additionally this patient pruritic rash on forearm consistent with ringworm. Less likely contact dermatitis, nummular eczema   Additional history obtained:  Additional history obtained from urgent care records from 01/21/2021   Lab Tests:  I Ordered, reviewed, and interpreted labs.  The pertinent results include:  UA with evidence of acute infection and glucosuria; leukocytosis, GB of 715, Creat of 2.08. anion gap normal; Initial lactic acid was normal but increased to 2.3 on recheck. Osmolality elevated at 320; beta hydroxybutyric acid elevated at 0.32. VBG without evidence of acidosis    Imaging Studies ordered:  I ordered imaging studies including CT head  I independently visualized and interpreted imaging which showed no acute abnormality I agree with the radiologist interpretation     Medicines ordered and prescription drug management:  I initially ordered medication including 2L LR bolus  for hyperglycemia and AKI symptoms. Glucose decreased mildly to 514.  Reevaluation of the patient after these medicines showed that the patient's glucose and creatinine levels improved, however her lactic acid has increased. Patient has secondary UTI, and along with hyperglycemia and recent prednisone usage, is likely having worsening infection. I have reviewed the patients home medicines and have made adjustments as needed     Consultations Obtained:  I requested consultation with the hospitalist,  and discussed lab and imaging findings as well as  pertinent plan - they recommend: admission. Dr. Karmen Bongo spoke with Dr. Godfrey Pick and agrees to admit patient   Reevaluation:  After the interventions noted above, I reevaluated the patient and found that they have only slightly improved. I ordered another 1L LR bolus followed by 125mg /hr maintenance therapy. I also initiated insulin drip and IV Rocephin for UTI.   Dispostion:  After consideration of the diagnostic results and the patients response to treatment feel that the patent would benefit from admission. Hyperglycemic hyperosmolic state - patient completed 3L LR bolus and started on insulin drip as described above.  AKI - fluid treatment for HHS concurrently treating AKI with mild improvement noted UTI with possible sepsis - 2g Rocephin administered in ED Ringworm - Patient will need to be discharged with topical antifungal for her infection    All of the labs and imaging were discussed with patient at bedside, including need for admission for HHS and increased lactic acid. They have expressed understanding of verbal discharge instructions and implications for return. Pt had opportunity to ask questions and have no further questions at this time.     Final Clinical Impression(s) / ED Diagnoses Final diagnoses:  Hyperosmolar hyperglycemic state (HHS) ([place])  AKI (acute kidney injury) ([place])  Acute cystitis with hematuria    Rx / DC Orders ED Discharge Orders     None        Rodena Piety 01/27/21 1803    Godfrey Pick, MD 01/27/21 2210

## 2021-01-28 ENCOUNTER — Other Ambulatory Visit: Payer: Self-pay

## 2021-01-28 DIAGNOSIS — Z885 Allergy status to narcotic agent status: Secondary | ICD-10-CM | POA: Diagnosis not present

## 2021-01-28 DIAGNOSIS — Y92009 Unspecified place in unspecified non-institutional (private) residence as the place of occurrence of the external cause: Secondary | ICD-10-CM | POA: Diagnosis not present

## 2021-01-28 DIAGNOSIS — Z7982 Long term (current) use of aspirin: Secondary | ICD-10-CM | POA: Diagnosis not present

## 2021-01-28 DIAGNOSIS — T383X6A Underdosing of insulin and oral hypoglycemic [antidiabetic] drugs, initial encounter: Secondary | ICD-10-CM | POA: Diagnosis present

## 2021-01-28 DIAGNOSIS — Z794 Long term (current) use of insulin: Secondary | ICD-10-CM | POA: Diagnosis not present

## 2021-01-28 DIAGNOSIS — E1100 Type 2 diabetes mellitus with hyperosmolarity without nonketotic hyperglycemic-hyperosmolar coma (NKHHC): Secondary | ICD-10-CM

## 2021-01-28 DIAGNOSIS — Z8673 Personal history of transient ischemic attack (TIA), and cerebral infarction without residual deficits: Secondary | ICD-10-CM | POA: Diagnosis not present

## 2021-01-28 DIAGNOSIS — N3001 Acute cystitis with hematuria: Principal | ICD-10-CM

## 2021-01-28 DIAGNOSIS — E860 Dehydration: Secondary | ICD-10-CM | POA: Diagnosis present

## 2021-01-28 DIAGNOSIS — I10 Essential (primary) hypertension: Secondary | ICD-10-CM | POA: Diagnosis present

## 2021-01-28 DIAGNOSIS — Z833 Family history of diabetes mellitus: Secondary | ICD-10-CM | POA: Diagnosis not present

## 2021-01-28 DIAGNOSIS — N179 Acute kidney failure, unspecified: Secondary | ICD-10-CM | POA: Diagnosis present

## 2021-01-28 DIAGNOSIS — N3941 Urge incontinence: Secondary | ICD-10-CM | POA: Diagnosis present

## 2021-01-28 DIAGNOSIS — N390 Urinary tract infection, site not specified: Secondary | ICD-10-CM

## 2021-01-28 DIAGNOSIS — E1165 Type 2 diabetes mellitus with hyperglycemia: Secondary | ICD-10-CM | POA: Diagnosis present

## 2021-01-28 DIAGNOSIS — Z91199 Patient's noncompliance with other medical treatment and regimen due to unspecified reason: Secondary | ICD-10-CM

## 2021-01-28 DIAGNOSIS — Z79890 Hormone replacement therapy (postmenopausal): Secondary | ICD-10-CM | POA: Diagnosis not present

## 2021-01-28 DIAGNOSIS — Z888 Allergy status to other drugs, medicaments and biological substances status: Secondary | ICD-10-CM | POA: Diagnosis not present

## 2021-01-28 DIAGNOSIS — Z8249 Family history of ischemic heart disease and other diseases of the circulatory system: Secondary | ICD-10-CM | POA: Diagnosis not present

## 2021-01-28 DIAGNOSIS — Z90710 Acquired absence of both cervix and uterus: Secondary | ICD-10-CM | POA: Diagnosis not present

## 2021-01-28 DIAGNOSIS — Z79899 Other long term (current) drug therapy: Secondary | ICD-10-CM | POA: Diagnosis not present

## 2021-01-28 DIAGNOSIS — Z7902 Long term (current) use of antithrombotics/antiplatelets: Secondary | ICD-10-CM | POA: Diagnosis not present

## 2021-01-28 DIAGNOSIS — L309 Dermatitis, unspecified: Secondary | ICD-10-CM | POA: Diagnosis present

## 2021-01-28 DIAGNOSIS — Z91012 Allergy to eggs: Secondary | ICD-10-CM | POA: Diagnosis not present

## 2021-01-28 DIAGNOSIS — Z20822 Contact with and (suspected) exposure to covid-19: Secondary | ICD-10-CM | POA: Diagnosis present

## 2021-01-28 LAB — GLUCOSE, CAPILLARY
Glucose-Capillary: 269 mg/dL — ABNORMAL HIGH (ref 70–99)
Glucose-Capillary: 272 mg/dL — ABNORMAL HIGH (ref 70–99)
Glucose-Capillary: 237 mg/dL — ABNORMAL HIGH (ref 70–99)
Glucose-Capillary: 110 mg/dL — ABNORMAL HIGH (ref 70–99)

## 2021-01-28 LAB — BASIC METABOLIC PANEL
Anion gap: 8 (ref 5–15)
BUN: 27 mg/dL — ABNORMAL HIGH (ref 6–20)
CO2: 24 mmol/L (ref 22–32)
Calcium: 8.8 mg/dL — ABNORMAL LOW (ref 8.9–10.3)
Chloride: 102 mmol/L (ref 98–111)
Creatinine, Ser: 1.28 mg/dL — ABNORMAL HIGH (ref 0.44–1.00)
GFR, Estimated: 49 mL/min — ABNORMAL LOW (ref >60)
Glucose, Bld: 192 mg/dL — ABNORMAL HIGH (ref 70–99)
Potassium: 3.8 mmol/L (ref 3.5–5.1)
Sodium: 134 mmol/L — ABNORMAL LOW (ref 135–145)

## 2021-01-28 LAB — CBC
HCT: 39.1 % (ref 36.0–46.0)
Hemoglobin: 12.9 g/dL (ref 12.0–15.0)
MCH: 28 pg (ref 26.0–34.0)
MCHC: 33 g/dL (ref 30.0–36.0)
MCV: 85 fL (ref 80.0–100.0)
Platelets: 224 10*3/uL (ref 150–400)
RBC: 4.6 MIL/uL (ref 3.87–5.11)
RDW: 13.2 % (ref 11.5–15.5)
WBC: 8.5 10*3/uL (ref 4.0–10.5)
nRBC: 0 % (ref 0.0–0.2)

## 2021-01-28 LAB — CBG MONITORING, ED: Glucose-Capillary: 208 mg/dL — ABNORMAL HIGH (ref 70–99)

## 2021-01-28 MED ORDER — INSULIN GLARGINE-YFGN 100 UNIT/ML ~~LOC~~ SOLN
30.0000 [IU] | Freq: Every day | SUBCUTANEOUS | Status: DC
  Administered 2021-01-28: 23:00:00 30 [IU] via SUBCUTANEOUS
  Filled 2021-01-28 (×3): qty 0.3

## 2021-01-28 MED ORDER — INSULIN DETEMIR 100 UNIT/ML ~~LOC~~ SOLN
25.0000 [IU] | Freq: Two times a day (BID) | SUBCUTANEOUS | Status: DC
  Administered 2021-01-28: 16:00:00 25 [IU] via SUBCUTANEOUS
  Filled 2021-01-28 (×2): qty 0.25

## 2021-01-28 MED ORDER — AQUAPHOR EX OINT
TOPICAL_OINTMENT | Freq: Two times a day (BID) | CUTANEOUS | Status: DC | PRN
  Filled 2021-01-28: qty 50

## 2021-01-28 MED ORDER — INSULIN ASPART 100 UNIT/ML IJ SOLN
7.0000 [IU] | Freq: Three times a day (TID) | INTRAMUSCULAR | Status: DC
  Administered 2021-01-28: 17:00:00 7 [IU] via SUBCUTANEOUS

## 2021-01-28 NOTE — Progress Notes (Signed)
PROGRESS NOTE  Crystal Boyer KDT:[id number] DOB: 1960/09/01   PCP: [facility]  Patient is from: Home.  Independently ambulates at baseline.  DOA: 01/27/2021 LOS: 0  Chief complaints:  Chief Complaint  Patient presents with   Dizziness     Brief Narrative / Interim history: 60 year old F with PMH of DM-2, HTN, recent CVA and noncompliance with medication presenting with lightheadedness, pruritus, dysuria, urgency, frequency and incontinence, and admitted with uncontrolled diabetes with hyperglycemia, AKI and UTI.  Glucose elevated to 715.  Not in DKA.  Osmolality 320. Cr 2.05 (baseline 0.9-1).  BUN 39.  UA with large LE, rare bacteria, moderate Hgb and > 50 RBC.  Patient was started on IV fluid and IV ceftriaxone, and admitted.   Subjective: Seen and examined earlier this morning and this afternoon.  Patient reports improvement in her lightheadedness.  Still with skin rash on her left arm, left antecubital fossa, upper back and the legs.  She denies chest pain, dyspnea or GI symptoms.  Reports improvement in her UTI symptoms.  She also endorses suprapubic pain.   About an hour and a half later, patient threatened to leave AMA citing concern about her younger son trying to drive away her car and staff "not respectful and caring".  However, she agreed to stay after her son, Cristal Deer arrived and talked to her.  She is awake, alert and oriented x4 with fair insight.   Objective: Vitals:   01/28/21 0756 01/28/21 0845 01/28/21 0947 01/28/21 1034  BP: (!) 148/94 (!) 128/93 (!) 134/98 135/89  Pulse: 88 89 92 88  Resp: (!) 7 18 16 19   Temp: 97.7 F (36.5 C)  97.9 F (36.6 C) 97.7 F (36.5 C)  TempSrc:   Oral Oral  SpO2: 98% 99% 98% 100%    Examination:  GENERAL: No apparent distress.  Nontoxic. HEENT: MMM.  Vision and hearing grossly intact.  NECK: Supple.  No apparent JVD.  RESP: 100% on RA.  No IWOB.  Fair aeration bilaterally. CVS:  RRR. Heart sounds normal.   ABD/GI/GU: BS+. Abd soft, NTND.  MSK/EXT:  Moves extremities. No apparent deformity. No edema.  SKIN: no apparent skin lesion or wound NEURO: Awake, alert and oriented appropriately.  No apparent focal neuro deficit. PSYCH: Calm. Normal affect.   Procedures:  None  Microbiology summarized: COVID-19 and influenza PCR nonreactive. Urine culture pending.  Assessment & Plan: Uncontrolled DM-2 with HHS: A1c 11.6%.  Patient was on prednisone for skin rash.  At the same time, she was out of her insulin.  Glucose 715 on admission.  Serum osmolality 320. Recent Labs  Lab 01/27/21 1706 01/27/21 2145 01/28/21 0758 01/28/21 1050 01/28/21 1208  GLUCAP >600* 351* 208* 269* 272*  -Continue SSI-moderate -Increase NovoLog from 4 to 7 units 3 times daily with meals -Increase Levemir from 30 units nightly to 25 units twice daily -Continue home statin.  Acute lower UTI-patient has UTI symptoms including dysuria, frequency, urgency and incontinence.  However, his symptoms could be also due to uncontrolled hypertension and glucosuria.  UA with LE, rare bacteria, Hgb with RBC and glucosuria. -Continue IV ceftriaxone -Follow urine culture  AKI/azotemia: Likely prerenal from dehydration in the setting of uncontrolled diabetes: Improved. Recent Labs    04/20/20 1000 04/30/20 1003 05/01/20 0349 05/19/20 1115 06/25/20 1348 07/07/20 0636 12/23/20 0637 01/27/21 1008 01/27/21 1449 01/28/21 0432  BUN 16 11 10 18  28* 19 14 39* 39* 27*  CREATININE 0.98 1.03* 0.91 1.05* 1.12* 0.97 0.93 2.05* 1.78*  1.28*  -Continue IV fluid -Manage diabetes as above  Pruritic skin rash: Likely eczema.  Has a history of this.  Likely worsened due to dry winter weather -Encouraged emollients -Advised to avoid systemic steroid  Lightheadedness: Likely from dehydration in the setting of uncontrolled diabetes and steroid use.  Resolved. -Check orthostatic vitals  History of CVA/intracranial stenosis-scheduled for  endovascular revascularization in 10/2018 that was postponed due to uncontrolled hypertension -Continue statin, Plavix and aspirin -Outpatient follow-up with neuro IR.  Uncontrolled hypertension: Likely due to noncompliance with medication. -Resume home meds  Noncompliance -Counseled.    There is no height or weight on file to calculate BMI.         DVT prophylaxis:  enoxaparin (LOVENOX) injection 40 mg Start: 01/27/21 2200  Code Status: None Family Communication: Updated patient's sons at bedside. Level of care: Med-Surg Status is: Observation  The patient will require care spanning > 2 midnights and should be moved to inpatient because: Acute UTI requiring IV antibiotics pending urine culture, and uncontrolled diabetes with hyperglycemia, and AKI requiring IV fluid      Consultants:  None   Sch Meds:  Scheduled Meds:  amLODipine  2.5 mg Oral QHS   aspirin EC  81 mg Oral Daily   atorvastatin  80 mg Oral QHS   clopidogrel  75 mg Oral Daily   docusate sodium  100 mg Oral BID   enoxaparin (LOVENOX) injection  40 mg Subcutaneous Q24H   famotidine  40 mg Oral BID   insulin aspart  0-15 Units Subcutaneous TID WC   insulin aspart  0-5 Units Subcutaneous QHS   insulin aspart  7 Units Subcutaneous TID WC   insulin detemir  25 Units Subcutaneous BID   loratadine  10 mg Oral Daily   Continuous Infusions:  cefTRIAXone (ROCEPHIN)  IV Stopped (01/27/21 1834)   lactated ringers 125 mL/hr at 01/28/21 0757   PRN Meds:.acetaminophen **OR** acetaminophen, bisacodyl, hydrALAZINE, mineral oil-hydrophilic petrolatum, ondansetron **OR** ondansetron (ZOFRAN) IV, polyethylene glycol  Antimicrobials: Anti-infectives (From admission, onward)    Start     Dose/Rate Route Frequency Ordered Stop   01/27/21 1700  cefTRIAXone (ROCEPHIN) 1 g in sodium chloride 0.9 % 100 mL IVPB        1 g 200 mL/hr over 30 Minutes Intravenous Every 24 hours 01/27/21 1638     01/27/21 1600  cefTRIAXone  (ROCEPHIN) 2 g in sodium chloride 0.9 % 100 mL IVPB  Status:  Discontinued        2 g 200 mL/hr over 30 Minutes Intravenous  Once 01/27/21 1557 01/27/21 1618        I have personally reviewed the following labs and images: CBC: Recent Labs  Lab 01/27/21 1008 01/27/21 1650 01/28/21 0432  WBC 11.8*  --  8.5  HGB 14.8 14.3 12.9  HCT 44.1 42.0 39.1  MCV 82.6  --  85.0  PLT 314  --  224   BMP &GFR Recent Labs  Lab 01/27/21 1008 01/27/21 1449 01/27/21 1650 01/28/21 0432  NA 127* 127* 131* 134*  K 4.4 4.6 4.2 3.8  CL 95* 94*  --  102  CO2 20* 23  --  24  GLUCOSE 715* 561*  --  192*  BUN 39* 39*  --  27*  CREATININE 2.05* 1.78*  --  1.28*  CALCIUM 9.9 9.3  --  8.8*   CrCl cannot be calculated (Unknown ideal weight.). Liver & Pancreas: No results for input(s): AST, ALT, ALKPHOS, BILITOT, PROT, ALBUMIN  in the last 168 hours. No results for input(s): LIPASE, AMYLASE in the last 168 hours. No results for input(s): AMMONIA in the last 168 hours. Diabetic: Recent Labs    01/27/21 1643  HGBA1C 11.6*   Recent Labs  Lab 01/27/21 1706 01/27/21 2145 01/28/21 0758 01/28/21 1050 01/28/21 1208  GLUCAP >600* 351* 208* 269* 272*   Cardiac Enzymes: No results for input(s): CKTOTAL, CKMB, CKMBINDEX, TROPONINI in the last 168 hours. No results for input(s): PROBNP in the last 8760 hours. Coagulation Profile: No results for input(s): INR, PROTIME in the last 168 hours. Thyroid Function Tests: No results for input(s): TSH, T4TOTAL, FREET4, T3FREE, THYROIDAB in the last 72 hours. Lipid Profile: No results for input(s): CHOL, HDL, LDLCALC, TRIG, CHOLHDL, LDLDIRECT in the last 72 hours. Anemia Panel: No results for input(s): VITAMINB12, FOLATE, FERRITIN, TIBC, IRON, RETICCTPCT in the last 72 hours. Urine analysis:    Component Value Date/Time   COLORURINE YELLOW 01/27/2021 1004   APPEARANCEUR CLOUDY (A) 01/27/2021 1004   LABSPEC 1.023 01/27/2021 1004   PHURINE 5.0 01/27/2021  1004   GLUCOSEU >=500 (A) 01/27/2021 1004   HGBUR MODERATE (A) 01/27/2021 1004   BILIRUBINUR NEGATIVE 01/27/2021 1004   KETONESUR NEGATIVE 01/27/2021 1004   PROTEINUR 30 (A) 01/27/2021 1004   UROBILINOGEN 0.2 12/25/2012 1200   NITRITE NEGATIVE 01/27/2021 1004   LEUKOCYTESUR LARGE (A) 01/27/2021 1004   Sepsis Labs: Invalid input(s): PROCALCITONIN, LACTICIDVEN  Microbiology: Recent Results (from the past 240 hour(s))  Resp Panel by RT-PCR (Flu A&B, Covid) Nasopharyngeal Swab     Status: None   Collection Time: 01/27/21  6:11 PM   Specimen: Nasopharyngeal Swab; Nasopharyngeal(NP) swabs in vial transport medium  Result Value Ref Range Status   SARS Coronavirus 2 by RT PCR NEGATIVE NEGATIVE Final    Comment: (NOTE) SARS-CoV-2 target nucleic acids are NOT DETECTED.  The SARS-CoV-2 RNA is generally detectable in upper respiratory specimens during the acute phase of infection. The lowest concentration of SARS-CoV-2 viral copies this assay can detect is 138 copies/mL. A negative result does not preclude SARS-Cov-2 infection and should not be used as the sole basis for treatment or other patient management decisions. A negative result may occur with  improper specimen collection/handling, submission of specimen other than nasopharyngeal swab, presence of viral mutation(s) within the areas targeted by this assay, and inadequate number of viral copies(<138 copies/mL). A negative result must be combined with clinical observations, patient history, and epidemiological information. The expected result is Negative.  Fact Sheet for Patients:  BloggerCourse.com  Fact Sheet for Healthcare Providers:  SeriousBroker.it  This test is no t yet approved or cleared by the [place] FDA and  has been authorized for detection and/or diagnosis of SARS-CoV-2 by FDA under an Emergency Use Authorization (EUA). This EUA will remain  in effect (meaning  this test can be used) for the duration of the COVID-19 declaration under Section 564(b)(1) of the Act, 21 U.S.C.section 360bbb-3(b)(1), unless the authorization is terminated  or revoked sooner.       Influenza A by PCR NEGATIVE NEGATIVE Final   Influenza B by PCR NEGATIVE NEGATIVE Final    Comment: (NOTE) The Xpert Xpress SARS-CoV-2/FLU/RSV plus assay is intended as an aid in the diagnosis of influenza from Nasopharyngeal swab specimens and should not be used as a sole basis for treatment. Nasal washings and aspirates are unacceptable for Xpert Xpress SARS-CoV-2/FLU/RSV testing.  Fact Sheet for Patients: BloggerCourse.com  Fact Sheet for Healthcare Providers: SeriousBroker.it  This test  is not yet approved or cleared by the [place] and has been authorized for detection and/or diagnosis of SARS-CoV-2 by FDA under an Emergency Use Authorization (EUA). This EUA will remain in effect (meaning this test can be used) for the duration of the COVID-19 declaration under Section 564(b)(1) of the Act, 21 U.S.C. section 360bbb-3(b)(1), unless the authorization is terminated or revoked.  Performed at [facility], [address] [place], [place] [address]     Radiology Studies: No results found.    Sandro Burgo T. Kasi Lasky [facility]  If 7PM-7AM, please contact night-coverage [email or web address] 01/28/2021, 1:56 PM

## 2021-01-28 NOTE — Evaluation (Signed)
Physical Therapy Evaluation Patient Details Name: Crystal Boyer MRN: [id number] DOB: 06/14/1960 Today's Date: 01/28/2021  History of Present Illness  Crystal Boyer is a 60 y.o. female with medical history significant of DM; HTN; and recent CVA presenting with dizziness and itching. Since 12/15 she has been feeling more lightheaded and also had urinary urgency and incontinence. Found to have uncontrolled diabetes mellitus with hyperglycemia and AKI.PHMx:CVA and DM  Clinical Impression  Patient presents with decreased mobility due to generalized weakness/decreased balance due to above diagnosis.  Patient able to ambulate in hallway and reports had been up in room making her bed (stretcher) earlier today.  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  She has had falls with this illness.  Feel she will benefit from skilled PT in the acute setting to insure safety on stairs and progression of ambulation safety.  Patient reports her son can stay with her initially and plans to look into ground level apartment.  Likely no follow up PT needs at d/c.       Recommendations for follow up therapy are one component of a multi-disciplinary discharge planning process, led by the attending physician.  Recommendations may be updated based on patient status, additional functional criteria and insurance authorization.  Follow Up Recommendations No PT follow up    Assistance Recommended at Discharge Intermittent Supervision/Assistance  Functional Status Assessment Patient has had a recent decline in their functional status and demonstrates the ability to make significant improvements in function in a reasonable and predictable amount of time.  Equipment Recommendations  None recommended by PT    Recommendations for Other Services       Precautions / Restrictions Precautions Precautions: Fall Restrictions Weight Bearing Restrictions: No      Mobility  Bed  Mobility Overal bed mobility: Needs Assistance Bed Mobility: Supine to Sit     Supine to sit: Supervision          Transfers Overall transfer level: Needs assistance Equipment used: None Transfers: Sit to/from Stand Sit to Stand: Supervision           General transfer comment: assist for safety, orthostatics taken and negative    Ambulation/Gait Ambulation/Gait assistance: Supervision Gait Distance (Feet): 160 Feet Assistive device: None Gait Pattern/deviations: Step-to pattern;Step-through pattern;Decreased stride length;Shuffle;Wide base of support       General Gait Details: S for safety/balance, pt shuffling feet some but no LOB  Stairs            Wheelchair Mobility    Modified Rankin (Stroke Patients Only)       Balance Overall balance assessment: Needs assistance Sitting-balance support: No upper extremity supported;Feet supported Sitting balance-Leahy Scale: Good Sitting balance - Comments: bringing feet up to pull on her shoes in sitting   Standing balance support: No upper extremity supported Standing balance-Leahy Scale: Good                               Pertinent Vitals/Pain Pain Assessment: No/denies pain    Home Living Family/patient expects to be discharged to:: Private residence Living Arrangements: Alone Available Help at Discharge: Family;Available PRN/intermittently Type of Home: Apartment Home Access: Stairs to enter Entrance Stairs-Rails: Right;Left;Can reach both Entrance Stairs-Number of Steps: 10   Home Layout: One level Home Equipment: None Additional Comments: pre-school teacher    Prior Function Prior Level of Function : Independent/Modified Independent;Working/employed  Mobility Comments: reports 3 falls recently on stairs mild abrasions on knees, reports can get a ground floor apartment ADLs Comments: previous stroke with STM deficits per pt     Hand Dominance   Dominant Hand:  Right    Extremity/Trunk Assessment   Upper Extremity Assessment Upper Extremity Assessment: Defer to OT evaluation    Lower Extremity Assessment Lower Extremity Assessment: Generalized weakness (h/o neuropathy, worse on L near ball of the foot)    Cervical / Trunk Assessment Cervical / Trunk Assessment: Normal  Communication   Communication: No difficulties  Cognition Arousal/Alertness: Awake/alert Behavior During Therapy: WFL for tasks assessed/performed Overall Cognitive Status: History of cognitive impairments - at baseline                                 General Comments: Does have memory problems from prior CVA, especially the date        General Comments      Exercises     Assessment/Plan    PT Assessment Patient needs continued PT services  PT Problem List Decreased strength;Decreased mobility;Decreased safety awareness;Decreased activity tolerance;Decreased balance       PT Treatment Interventions Gait training;Stair training;Functional mobility training;Patient/family education;Therapeutic activities    PT Goals (Current goals can be found in the Care Plan section)  Acute Rehab PT Goals Patient Stated Goal: return to independent PT Goal Formulation: With patient Time For Goal Achievement: 02/11/21 Potential to Achieve Goals: Good    Frequency Min 3X/week   Barriers to discharge        Co-evaluation PT/OT/SLP Co-Evaluation/Treatment: Yes Reason for Co-Treatment: For patient/therapist safety PT goals addressed during session: Mobility/safety with mobility;Balance OT goals addressed during session: ADL's and self-care;Strengthening/ROM       AM-PAC PT "6 Clicks" Mobility  Outcome Measure Help needed turning from your back to your side while in a flat bed without using bedrails?: None Help needed moving from lying on your back to sitting on the side of a flat bed without using bedrails?: None Help needed moving to and from a bed to  a chair (including a wheelchair)?: None Help needed standing up from a chair using your arms (e.g., wheelchair or bedside chair)?: A Little Help needed to walk in hospital room?: A Little Help needed climbing 3-5 steps with a railing? : A Little 6 Click Score: 21    End of Session   Activity Tolerance: Patient tolerated treatment well Patient left: in chair;Other (comment) (for transport to floor)   PT Visit Diagnosis: Other abnormalities of gait and mobility (R26.89);History of falling (Z91.81)    Time: 6967-8938 PT Time Calculation (min) (ACUTE ONLY): 21 min   Charges:   PT Evaluation $PT Eval Low Complexity: 1 Low          Sheran Lawless, PT Acute Rehabilitation Services Pager:[phone] [phone] 01/28/2021   Elray Mcgregor 01/28/2021, 10:24 AM

## 2021-01-28 NOTE — ED Notes (Signed)
Pt given a basin of hot water and wash clothes so that pt could clean herself up. Pt currently sitting up on the side of the bed and appears to be in no type of distress. This RN will continue to monitor pt.

## 2021-01-28 NOTE — Progress Notes (Signed)
Patient arrived to 6 north room 27 alert and oriented. Bed in lowest position call light in reach. Pain level 0

## 2021-01-28 NOTE — Progress Notes (Addendum)
Inpatient Diabetes Program Recommendations  AACE/ADA: New Consensus Statement on Inpatient Glycemic Control (2015)  Target Ranges:  Prepandial:   less than 140 mg/dL      Peak postprandial:   less than 180 mg/dL (1-2 hours)      Critically ill patients:  140 - 180 mg/dL   Lab Results  Component Value Date   GLUCAP 272 (H) 01/28/2021   HGBA1C 11.6 (H) 01/27/2021    Review of Glycemic Control  Latest Reference Range & Units 01/27/21 15:22 01/27/21 17:06 01/27/21 21:45 01/28/21 07:58 01/28/21 10:50 01/28/21 12:08  Glucose-Capillary 70 - 99 mg/dL 937 (HH) >342 (HH) 876 (H) 208 (H) 269 (H) 272 (H)  Diabetes history: DM 2 Outpatient Diabetes medications:  Lantus 30 units q HS, Prednisone 40 mg daily Current orders for Inpatient glycemic control:  Novolog moderate tid with meals and HS Levemir 30 units daily Novolog 4 units tid with meals Inpatient Diabetes Program Recommendations:    Consider changing Levemir too Semglee 40 units q HS.   Per patient she was supposed to be taking Lantus 30 units daily at home.  She states that she does not have a meter and has not been taking insulin for the past week due to running out of both.  She was also started on Prednisone outpatient which likely increased blood sugars.   She has prescription coverage/Medicaid. Discussed importance of taking medications, checking blood sugars, and following up with MD.   She will probably do best with Lantus and metformin at discharge (just one shot a day). At discharge patient will need Lantus pen (30080), glucose meter ([id number]), and insulin pen needles [id number]).   Thanks,  Beryl Meager, RN, BC-ADM Inpatient Diabetes Coordinator Pager [phone]  (8a-5p)

## 2021-01-28 NOTE — Evaluation (Signed)
Occupational Therapy Evaluation Patient Details Name: Crystal Boyer MRN: [id number] DOB: 03-Nov-1960 Today's Date: 01/28/2021   History of Present Illness Crystal Boyer is a 60 y.o. female with medical history significant of DM; HTN; and recent CVA presenting with dizziness and itching. Since 12/15 she has been feeling more lightheaded and also had urinary urgency and incontinence. Found to have uncontrolled diabetes mellitus with hyperglycemia and AKI.PHMx:CVA and DM   Clinical Impression   This 60 yo female admitted with above presents to acute OT with PLOF of being totally independent with basic ADLs, IADLs, working, and driving. Currently she is at a S level for basic ADLs and will benefit from one more session of acute OT before D/C.      Recommendations for follow up therapy are one component of a multi-disciplinary discharge planning process, led by the attending physician.  Recommendations may be updated based on patient status, additional functional criteria and insurance authorization.   Follow Up Recommendations  No OT follow up    Assistance Recommended at Discharge PRN  Functional Status Assessment  Patient has had a recent decline in their functional status and demonstrates the ability to make significant improvements in function in a reasonable and predictable amount of time.  Equipment Recommendations  None recommended by OT       Precautions / Restrictions Precautions Precautions: Fall Restrictions Weight Bearing Restrictions: No      Mobility Bed Mobility Overal bed mobility: Needs Assistance Bed Mobility: Supine to Sit     Supine to sit: Supervision          Transfers Overall transfer level: Needs assistance Equipment used: None Transfers: Sit to/from Stand Sit to Stand: Supervision                  Balance Overall balance assessment: Needs assistance Sitting-balance support: No upper extremity supported;Feet supported Sitting  balance-Leahy Scale: Good     Standing balance support: No upper extremity supported Standing balance-Leahy Scale: Fair                             ADL either performed or assessed with clinical judgement   ADL Overall ADL's : Needs assistance/impaired Eating/Feeding: Independent;Sitting   Grooming: Supervision/safety;Standing   Upper Body Bathing: Supervision/ safety;Sitting;Standing   Lower Body Bathing: Supervison/ safety;Sit to/from stand   Upper Body Dressing : Supervision/safety;Sitting;Standing   Lower Body Dressing: Supervision/safety;Sit to/from stand   Toilet Transfer: Supervision/safety;Ambulation Toilet Transfer Details (indicate cue type and reason): No AD; simulated strecher-around unit-sit in recliner Toileting- Clothing Manipulation and Hygiene: Supervision/safety;Sit to/from stand               Vision Ability to See in Adequate Light: 0 Adequate Patient Visual Report: No change from baseline              Pertinent Vitals/Pain Pain Assessment: No/denies pain     Hand Dominance Right   Extremity/Trunk Assessment Upper Extremity Assessment Upper Extremity Assessment: Overall WFL for tasks assessed           Communication Communication Communication: No difficulties   Cognition Arousal/Alertness: Awake/alert Behavior During Therapy: WFL for tasks assessed/performed Overall Cognitive Status: History of cognitive impairments - at baseline                                 General Comments: Does have memory problems from prior CVA, especially  the date                Home Living Family/patient expects to be discharged to:: Private residence Living Arrangements: Alone Available Help at Discharge: Family;Available PRN/intermittently Type of Home: Apartment Home Access: Stairs to enter Entrance Stairs-Number of Steps: 10 Entrance Stairs-Rails: Right;Left;Can reach both Home Layout: One level     Bathroom  Shower/Tub: IT trainer: Standard     Home Equipment: None   Additional Comments: Administrator      Prior Functioning/Environment Prior Level of Function : Independent/Modified Independent;Working/employed                        OT Problem List: Impaired balance (sitting and/or standing)      OT Treatment/Interventions: Self-care/ADL training;DME and/or AE instruction;Patient/family education;Balance training    OT Goals(Current goals can be found in the care plan section) Acute Rehab OT Goals Patient Stated Goal: to feel better, 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 OT Goal Formulation: With patient Time For Goal Achievement: 02/11/21 Potential to Achieve Goals: Good  OT Frequency: Min 2X/week           Co-evaluation PT/OT/SLP Co-Evaluation/Treatment: Yes Reason for Co-Treatment: For patient/therapist safety PT goals addressed during session: Mobility/safety with mobility;Balance;Strengthening/ROM OT goals addressed during session: ADL's and self-care;Strengthening/ROM      AM-PAC OT "6 Clicks" Daily Activity     Outcome Measure Help from another person eating meals?: None Help from another person taking care of personal grooming?: A Little Help from another person toileting, which includes using toliet, bedpan, or urinal?: A Little Help from another person bathing (including washing, rinsing, drying)?: A Little Help from another person to put on and taking off regular upper body clothing?: A Little Help from another person to put on and taking off regular lower body clothing?: A Little 6 Click Score: 19   End of Session Nurse Communication: Mobility status (in ED)  Activity Tolerance: Patient tolerated treatment well Patient left:  (in wheelchair getting ready to go up to floor)  OT Visit Diagnosis: Unsteadiness on feet (R26.81);Other abnormalities of gait and mobility (R26.89);History of falling (Z91.81)                 Time: 4142-3953 OT Time Calculation (min): 21 min Charges:  OT General Charges $OT Visit: 1 Visit OT Evaluation $OT Eval Moderate Complexity: 1 Mod  Ignacia Palma, OTR/L Acute [employer] Pager [phone] Office [phone]    Evette Georges 01/28/2021, 10:07 AM

## 2021-01-29 ENCOUNTER — Telehealth (HOSPITAL_COMMUNITY): Payer: Self-pay | Admitting: Radiology

## 2021-01-29 DIAGNOSIS — E1165 Type 2 diabetes mellitus with hyperglycemia: Secondary | ICD-10-CM | POA: Diagnosis not present

## 2021-01-29 DIAGNOSIS — E1100 Type 2 diabetes mellitus with hyperosmolarity without nonketotic hyperglycemic-hyperosmolar coma (NKHHC): Secondary | ICD-10-CM | POA: Diagnosis not present

## 2021-01-29 DIAGNOSIS — I10 Essential (primary) hypertension: Secondary | ICD-10-CM | POA: Diagnosis not present

## 2021-01-29 DIAGNOSIS — N390 Urinary tract infection, site not specified: Secondary | ICD-10-CM | POA: Diagnosis not present

## 2021-01-29 LAB — GLUCOSE, CAPILLARY
Glucose-Capillary: 166 mg/dL — ABNORMAL HIGH (ref 70–99)
Glucose-Capillary: 312 mg/dL — ABNORMAL HIGH (ref 70–99)

## 2021-01-29 LAB — RENAL FUNCTION PANEL
Albumin: 2.8 g/dL — ABNORMAL LOW (ref 3.5–5.0)
Anion gap: 6 (ref 5–15)
BUN: 22 mg/dL — ABNORMAL HIGH (ref 6–20)
CO2: 25 mmol/L (ref 22–32)
Calcium: 8.6 mg/dL — ABNORMAL LOW (ref 8.9–10.3)
Chloride: 105 mmol/L (ref 98–111)
Creatinine, Ser: 1.2 mg/dL — ABNORMAL HIGH (ref 0.44–1.00)
GFR, Estimated: 52 mL/min — ABNORMAL LOW (ref >60)
Glucose, Bld: 222 mg/dL — ABNORMAL HIGH (ref 70–99)
Phosphorus: 3.3 mg/dL (ref 2.5–4.6)
Potassium: 3.8 mmol/L (ref 3.5–5.1)
Sodium: 136 mmol/L (ref 135–145)

## 2021-01-29 LAB — CBC
HCT: 36.9 % (ref 36.0–46.0)
Hemoglobin: 11.9 g/dL — ABNORMAL LOW (ref 12.0–15.0)
MCH: 27.3 pg (ref 26.0–34.0)
MCHC: 32.2 g/dL (ref 30.0–36.0)
MCV: 84.6 fL (ref 80.0–100.0)
Platelets: 189 10*3/uL (ref 150–400)
RBC: 4.36 MIL/uL (ref 3.87–5.11)
RDW: 13 % (ref 11.5–15.5)
WBC: 6.4 10*3/uL (ref 4.0–10.5)
nRBC: 0 % (ref 0.0–0.2)

## 2021-01-29 LAB — MAGNESIUM: Magnesium: 1.5 mg/dL — ABNORMAL LOW (ref 1.7–2.4)

## 2021-01-29 MED ORDER — HYDRALAZINE HCL 25 MG PO TABS: 25.0000 mg | ORAL_TABLET | Freq: Four times a day (QID) | ORAL | Status: DC | PRN

## 2021-01-29 MED ORDER — AQUAPHOR EX OINT: TOPICAL_OINTMENT | Freq: Two times a day (BID) | CUTANEOUS | 0 refills | Status: DC

## 2021-01-29 MED ORDER — METFORMIN HCL 1000 MG PO TABS: 1000.0000 mg | ORAL_TABLET | Freq: Two times a day (BID) | ORAL | 1 refills | Status: DC

## 2021-01-29 MED ORDER — LOSARTAN POTASSIUM 50 MG PO TABS: 50.0000 mg | ORAL_TABLET | Freq: Every day | ORAL | 0 refills | Status: DC

## 2021-01-29 MED ORDER — INSULIN ASPART 100 UNIT/ML FLEXPEN: 5.0000 [IU] | PEN_INJECTOR | Freq: Three times a day (TID) | SUBCUTANEOUS | 1 refills | Status: DC

## 2021-01-29 MED ORDER — AMLODIPINE BESYLATE 10 MG PO TABS
10.0000 mg | ORAL_TABLET | Freq: Every day | ORAL | Status: DC
  Administered 2021-01-29: 10:00:00 10 mg via ORAL
  Filled 2021-01-29: qty 1

## 2021-01-29 MED ORDER — MAGNESIUM OXIDE -MG SUPPLEMENT 500 MG PO TABS: 500.0000 mg | ORAL_TABLET | Freq: Two times a day (BID) | ORAL | 0 refills | Status: AC

## 2021-01-29 MED ORDER — LANTUS SOLOSTAR 100 UNIT/ML ~~LOC~~ SOPN: 30.0000 [IU] | PEN_INJECTOR | Freq: Every day | SUBCUTANEOUS | 1 refills | Status: DC

## 2021-01-29 MED ORDER — AMLODIPINE BESYLATE 10 MG PO TABS: 10.0000 mg | ORAL_TABLET | Freq: Every day | ORAL | 1 refills | Status: DC

## 2021-01-29 MED ORDER — MAGNESIUM SULFATE 2 GM/50ML IV SOLN
2.0000 g | Freq: Once | INTRAVENOUS | Status: DC
  Filled 2021-01-29: qty 50

## 2021-01-29 MED ORDER — BLOOD GLUCOSE MONITOR KIT: PACK | 0 refills | Status: AC

## 2021-01-29 MED ORDER — CEFADROXIL 500 MG PO CAPS: 500.0000 mg | ORAL_CAPSULE | Freq: Two times a day (BID) | ORAL | 0 refills | Status: DC

## 2021-01-29 MED ORDER — AMLODIPINE BESYLATE 10 MG PO TABS: 10.0000 mg | ORAL_TABLET | Freq: Every day | ORAL | Status: DC

## 2021-01-29 NOTE — Telephone Encounter (Signed)
Patient called to inform us that she has been admitted to the hospital. She wanted Dr. Corliss Skains to be aware that she is having dizziness as well as other issues. She also wanted him to look at her recent CT head. I sent a message to [employer], [place] with this information. JM

## 2021-01-29 NOTE — Progress Notes (Signed)
Received message stating patient is requesting Dr. Corliss Skains to review recent CT.   Dr. Corliss Skains states that he agrees with the radiologist report: No acute intracranial abnormality.  For further evaluation and recommendation, please call [facility] to schedule a consultation visit as an outpatient with Dr. Corliss Skains.  While patient is hospitalized, will defer treatment plan to the attending provider.    Lynann Bologna Addyson Traub PA-C 01/29/2021 8:54 AM

## 2021-01-29 NOTE — Discharge Summary (Signed)
Physician Discharge Summary  Crystal Boyer PJK:[id number] DOB: 1960/09/08 DOA: 01/27/2021  PCP: [facility]  Admit date: 01/27/2021 Discharge date: 01/29/2021 Admitted From: Home Disposition: Home Recommendations for Outpatient Follow-up:  Follow ups as below. Please obtain CBC/BMP/Mag at follow up Please follow up on the following pending results: None Home Health: Not indicated Equipment/Devices: Not indicated Discharge Condition: Stable CODE STATUS: Full code  Follow-up Information     [facility]. Schedule an appointment as soon as possible for a visit in 1 week(s).   Specialty: Internal Medicine Contact information: [address]  [place] [address] [phone]                Hospital Course: 60 year old F with PMH of DM-2, HTN, recent CVA and noncompliance with medication presenting with lightheadedness, pruritus, dysuria, urgency, frequency and incontinence, and admitted with uncontrolled diabetes with hyperglycemia, AKI and UTI.  Glucose elevated to 715.  Not in DKA.  Osmolality 320. Cr 2.05 (baseline 0.9-1).  BUN 39.  UA with large LE, rare bacteria, moderate Hgb and > 50 RBC.  Patient was started on IV fluid and IV ceftriaxone, and admitted.  Hyperglycemia, AKI and patient's symptoms improved the next day.  Unfortunately, urine sample was not sent for urine culture although it is marked as collected.  She received 2 doses of IV ceftriaxone and discharged on cefadroxil 500 mg twice daily for 2 more days to complete treatment course empirically.  Insulin adjusted.  New prescription furnished.  Encouraged to follow-up with PCP in 1 to 2 weeks.   See individual problem list below for more on hospital course.  Discharge Diagnoses:  Uncontrolled DM-2 with HHS: A1c 11.6%.  Patient was on prednisone for skin rash.  At the same time, she was out of her insulin.  Glucose 715 on admission.  Serum osmolality 320.  Hyperglycemia  improved. -Discharged on home Lantus 30 units daily -Added NovoLog 5 units 3 times daily with meals -Added metformin 1000 mg twice daily -Continue home statin.  Acute lower UTI-patient has UTI symptoms including dysuria, frequency, urgency and incontinence.  However, his symptoms could be also due to uncontrolled hypertension and glucosuria.  UA with LE, rare bacteria, Hgb with RBC and glucosuria.  Unfortunately, urine culture was not sent to microbiology although marked collected. -IV ceftriaxone 12/21 and 12/22.  Discharged on p.o. cefadroxil for 2 more days   AKI/azotemia: Likely prerenal from dehydration in the setting of uncontrolled diabetes: Improved. Recent Labs    04/30/20 1003 05/01/20 0349 05/19/20 1115 06/25/20 1348 07/07/20 0636 12/23/20 0637 01/27/21 1008 01/27/21 1449 01/28/21 0432 01/29/21 0237  BUN _0 28* 19 14 39* 39* 27* 22*  CREATININE 1.03* 0.91 1.05* 1.12* 0.97 0.93 2.05* 1.78* 1.28* 1.20*  -Recheck renal function at follow-up. -Patient to hold losartan for 1 week.  Pruritic skin rash: Likely eczema.  Has a history of this.  Likely worsened due to dry winter weather -Encouraged emollients such as Aquaphor -Advised to avoid systemic steroid   Lightheadedness: Likely from dehydration in the setting of uncontrolled diabetes and steroid use.  Resolved.   History of CVA/intracranial stenosis-scheduled for endovascular revascularization in 10/2018 that was postponed due to uncontrolled hypertension -Continue statin, Plavix and aspirin -Outpatient follow-up with neuro IR.   Uncontrolled hypertension: Likely due to noncompliance with medication. -Increased amlodipine to 10 mg daily -Holding losartan for 1 week until AKI resolves completely   Hypomagnesemia: Mg 1.5.  Patient did not want to  wait on IV magnesium infusion -P.o. magnesium oxide 500 mg twice daily for 5 days  Noncompliance -Counseled.       Discharge Exam: Vitals:   01/28/21 2026  01/29/21 0503 01/29/21 0600 01/29/21 0754  BP: (!) 147/88 (!) 188/97 (!) 155/98 (!) 159/98  Pulse: 83 93 94 89  Temp: 98.3 F (36.8 C) 98.2 F (36.8 C)  97.7 F (36.5 C)  Resp: _0 SpO2: 99% 100%  98%  TempSrc: Oral   Oral     GENERAL: No apparent distress.  Nontoxic. HEENT: MMM.  Vision and hearing grossly intact.  NECK: Supple.  No apparent JVD.  RESP:  No IWOB.  Fair aeration bilaterally. CVS:  RRR. Heart sounds normal.  ABD/GI/GU: Bowel sounds present. Soft. Non tender.  MSK/EXT:  Moves extremities. No apparent deformity. No edema.  SKIN: no apparent skin lesion or wound NEURO: Awake and alert.  Oriented appropriately.  No apparent focal neuro deficit. PSYCH: Calm. Normal affect.   Discharge Instructions  Discharge Instructions     Diet - low sodium heart healthy   Complete by: As directed    Diet Carb Modified   Complete by: As directed    Discharge instructions   Complete by: As directed    It has been a pleasure taking care of you!  You were hospitalized with markedly elevated blood glucose likely from steroid and not using insulin, urinary tract infection and elevated blood pressure.  Your blood glucose improved with treatment.  We are discharging you more antibiotics to complete treatment course for urinary tract infection.  We made some adjustment to your blood pressure medication.  We have also added more medication for diabetes.  Please review your new medication list and the directions on your medications before you take them.  Follow-up with your primary care doctor in 1 to 2 weeks or sooner if needed.  It is very important that you eat regular meals (breakfast, lunch and dinner) to prevent hypoglycemia (low blood glucose) from insulin.   Take care,   Increase activity slowly   Complete by: As directed       Allergies as of 01/29/2021       Reactions   Eggs Or Egg-derived Products Diarrhea   Actos [pioglitazone] Other (See Comments)   Made the  patient feel like she was "going to die"   Brilinta [ticagrelor]    Shortness of breath    Glipizide Other (See Comments)   Made patient feel like she was "going to pass out and die"   Tramadol Rash        Medication List     TAKE these medications    amLODipine 10 MG tablet Commonly known as: NORVASC Take 1 tablet (10 mg total) by mouth daily. What changed:  medication strength how much to take   aspirin 81 MG EC tablet Take 1 tablet (81 mg total) by mouth daily. Swallow whole. What changed: additional instructions   atorvastatin 80 MG tablet Commonly known as: LIPITOR Take 1 tablet (80 mg total) by mouth daily. What changed: when to take this   blood glucose meter kit and supplies Kit Dispense based on patient and insurance preference. Use up to four times daily as directed.   cefadroxil 500 MG capsule Commonly known as: DURICEF Take 1 capsule (500 mg total) by mouth 2 (two) times daily.   clopidogrel 75 MG tablet Commonly known as: Plavix Take 1 tablet (75 mg total) by mouth daily.  ferrous sulfate 324 MG Tbec Take 324 mg by mouth daily with breakfast.   insulin aspart 100 UNIT/ML FlexPen Commonly known as: NOVOLOG Inject 5 Units into the skin 3 (three) times daily with meals.   Lantus SoloStar 100 UNIT/ML Solostar Pen Generic drug: insulin glargine Inject 30 Units into the skin daily. What changed: when to take this   losartan 50 MG tablet Commonly known as: COZAAR Take 1 tablet (50 mg total) by mouth daily. Start taking on: February 05, 2021 What changed: These instructions start on February 05, 2021. If you are unsure what to do until then, ask your doctor or other care provider.   Magnesium Oxide 500 MG Tabs Take 1 tablet (500 mg total) by mouth in the morning and at bedtime for 5 days.   metFORMIN 1000 MG tablet Commonly known as: Glucophage Take 1 tablet (1,000 mg total) by mouth 2 (two) times daily with a meal.   mineral oil-hydrophilic  petrolatum ointment Apply topically 2 (two) times daily.   ZINC PO Take 1 tablet by mouth at bedtime.        Consultations: None  Procedures/Studies:   CT Head Wo Contrast  Result Date: 01/27/2021 CLINICAL DATA:  Mental status change EXAM: CT HEAD WITHOUT CONTRAST TECHNIQUE: Contiguous axial images were obtained from the base of the skull through the vertex without intravenous contrast. COMPARISON:  Head CT dated April 30, 2020 FINDINGS: Brain: Chronic white matter ischemic change. No evidence of acute infarction, hemorrhage, hydrocephalus, extra-axial collection or mass lesion/mass effect. Vascular: No hyperdense vessel or unexpected calcification. Skull: Normal. Negative for fracture or focal lesion. Sinuses/Orbits: No acute finding. Other: None. IMPRESSION: No acute intracranial abnormality. Electronically Signed   By: Yetta Glassman M.D.   On: 01/27/2021 10:49       The results of significant diagnostics from this hospitalization (including imaging, microbiology, ancillary and laboratory) are listed below for reference.     Microbiology: Recent Results (from the past 240 hour(s))  Resp Panel by RT-PCR (Flu A&B, Covid) Nasopharyngeal Swab     Status: None   Collection Time: 01/27/21  6:11 PM   Specimen: Nasopharyngeal Swab; Nasopharyngeal(NP) swabs in vial transport medium  Result Value Ref Range Status   SARS Coronavirus 2 by RT PCR NEGATIVE NEGATIVE Final    Comment: (NOTE) SARS-CoV-2 target nucleic acids are NOT DETECTED.  The SARS-CoV-2 RNA is generally detectable in upper respiratory specimens during the acute phase of infection. The lowest concentration of SARS-CoV-2 viral copies this assay can detect is 138 copies/mL. A negative result does not preclude SARS-Cov-2 infection and should not be used as the sole basis for treatment or other patient management decisions. A negative result may occur with  improper specimen collection/handling, submission of specimen  other than nasopharyngeal swab, presence of viral mutation(s) within the areas targeted by this assay, and inadequate number of viral copies(<138 copies/mL). A negative result must be combined with clinical observations, patient history, and epidemiological information. The expected result is Negative.  Fact Sheet for Patients:  EntrepreneurPulse.com.au  Fact Sheet for Healthcare Providers:  IncredibleEmployment.be  This test is no t yet approved or cleared by the [place] FDA and  has been authorized for detection and/or diagnosis of SARS-CoV-2 by FDA under an Emergency Use Authorization (EUA). This EUA will remain  in effect (meaning this test can be used) for the duration of the COVID-19 declaration under Section 564(b)(1) of the Act, 21 U.S.C.section 360bbb-3(b)(1), unless the authorization is terminated  or revoked sooner.  Influenza A by PCR NEGATIVE NEGATIVE Final   Influenza B by PCR NEGATIVE NEGATIVE Final    Comment: (NOTE) The Xpert Xpress SARS-CoV-2/FLU/RSV plus assay is intended as an aid in the diagnosis of influenza from Nasopharyngeal swab specimens and should not be used as a sole basis for treatment. Nasal washings and aspirates are unacceptable for Xpert Xpress SARS-CoV-2/FLU/RSV testing.  Fact Sheet for Patients: EntrepreneurPulse.com.au  Fact Sheet for Healthcare Providers: IncredibleEmployment.be  This test is not yet approved or cleared by the [place] FDA and has been authorized for detection and/or diagnosis of SARS-CoV-2 by FDA under an Emergency Use Authorization (EUA). This EUA will remain in effect (meaning this test can be used) for the duration of the COVID-19 declaration under Section 564(b)(1) of the Act, 21 U.S.C. section 360bbb-3(b)(1), unless the authorization is terminated or revoked.  Performed at [facility], [place] [address] [place],  [place] [address]      Labs:  CBC: Recent Labs  Lab 01/27/21 1008 01/27/21 1650 01/28/21 0432 01/29/21 0237  WBC 11.8*  --  8.5 6.4  HGB 14.8 14.3 12.9 11.9*  HCT 44.1 42.0 39.1 36.9  MCV 82.6  --  85.0 84.6  PLT 314  --  224 189   BMP &GFR Recent Labs  Lab 01/27/21 1008 01/27/21 1449 01/27/21 1650 01/28/21 0432 01/29/21 0237  NA 127* 127* 131* 134* 136  K 4.4 4.6 4.2 3.8 3.8  CL 95* 94*  --  102 105  CO2 20* 23  --  24 25  GLUCOSE 715* 561*  --  192* 222*  BUN 39* 39*  --  27* 22*  CREATININE 2.05* 1.78*  --  1.28* 1.20*  CALCIUM 9.9 9.3  --  8.8* 8.6*  MG  --   --   --   --  1.5*  PHOS  --   --   --   --  3.3   CrCl cannot be calculated (Unknown ideal weight.). Liver & Pancreas: Recent Labs  Lab 01/29/21 0237  ALBUMIN 2.8*   No results for input(s): LIPASE, AMYLASE in the last 168 hours. No results for input(s): AMMONIA in the last 168 hours. Diabetic: Recent Labs    01/27/21 1643  HGBA1C 11.6*   Recent Labs  Lab 01/28/21 1208 01/28/21 1655 01/28/21 2147 01/29/21 0157 01/29/21 0758  GLUCAP 272* 237* 110* 166* 312*   Cardiac Enzymes: No results for input(s): CKTOTAL, CKMB, CKMBINDEX, TROPONINI in the last 168 hours. No results for input(s): PROBNP in the last 8760 hours. Coagulation Profile: No results for input(s): INR, PROTIME in the last 168 hours. Thyroid Function Tests: No results for input(s): TSH, T4TOTAL, FREET4, T3FREE, THYROIDAB in the last 72 hours. Lipid Profile: No results for input(s): CHOL, HDL, LDLCALC, TRIG, CHOLHDL, LDLDIRECT in the last 72 hours. Anemia Panel: No results for input(s): VITAMINB12, FOLATE, FERRITIN, TIBC, IRON, RETICCTPCT in the last 72 hours. Urine analysis:    Component Value Date/Time   COLORURINE YELLOW 01/27/2021 1004   APPEARANCEUR CLOUDY (A) 01/27/2021 1004   LABSPEC 1.023 01/27/2021 1004   PHURINE 5.0 01/27/2021 1004   GLUCOSEU >=500 (A) 01/27/2021 1004   HGBUR MODERATE (A) 01/27/2021 1004    BILIRUBINUR NEGATIVE 01/27/2021 1004   KETONESUR NEGATIVE 01/27/2021 1004   PROTEINUR 30 (A) 01/27/2021 1004   UROBILINOGEN 0.2 12/25/2012 1200   NITRITE NEGATIVE 01/27/2021 1004   LEUKOCYTESUR LARGE (A) 01/27/2021 1004   Sepsis Labs: Invalid input(s): PROCALCITONIN, LACTICIDVEN   Time coordinating discharge: 80  minutes  SIGNED:  Mercy Riding, MD  [facility] 01/29/2021, 3:58 PM

## 2021-03-16 ENCOUNTER — Telehealth (HOSPITAL_COMMUNITY): Payer: Self-pay

## 2021-03-16 NOTE — Telephone Encounter (Signed)
Returned pt's call, no answer, left vm. AW  

## 2021-03-24 ENCOUNTER — Telehealth (HOSPITAL_COMMUNITY): Payer: Self-pay

## 2021-03-24 NOTE — Telephone Encounter (Signed)
Called to give pt instructions for her upcoming procedure, no answer,left vm. AW

## 2021-03-25 ENCOUNTER — Telehealth (HOSPITAL_COMMUNITY): Payer: Self-pay

## 2021-03-25 NOTE — Telephone Encounter (Signed)
Called again to give pt instructions for upcoming procedure with Dr. Corliss Skains, no answer, left vm. AW

## 2021-03-30 ENCOUNTER — Other Ambulatory Visit: Payer: Self-pay | Admitting: Radiology

## 2021-03-30 ENCOUNTER — Other Ambulatory Visit (HOSPITAL_COMMUNITY): Payer: Self-pay | Admitting: Interventional Radiology

## 2021-03-30 ENCOUNTER — Telehealth (HOSPITAL_COMMUNITY): Payer: Self-pay

## 2021-03-30 ENCOUNTER — Other Ambulatory Visit: Payer: Self-pay | Admitting: Student

## 2021-03-30 LAB — SARS CORONAVIRUS 2 (TAT 6-24 HRS): SARS Coronavirus 2: NEGATIVE

## 2021-03-30 NOTE — H&P (Incomplete)
Chief Complaint: Patient was seen in consultation today for cerebral angiogram with intervention at the request of Luanne Bras  Referring Physician(s): Deveshwar,Sanjeev  Supervising Physician: Luanne Bras  Patient Status: [facility] - Out-pt  History of Present Illness: Crystal Boyer is a 61 y.o. female with PMHs of HTN, DM, asthma, depression, CVA in March 2022 who is known to Dr. Estanislado Pandy for previous diagnostic cerebral angiogram on 07/07/20 and follow up visit on 09/22/20.  The cerebral angiogram showed:   Approximately 50% stenosis of the right middle cerebral artery distal M1 segment.   Severe segmental stenosis of a branch of the inferior division of the right middle cerebral artery in the M2 region.   Occlusion of distal mid perisylvian branch in the M3 M4 region.   Moderate to moderately severe focal area of narrowing of a branch of the inferior division of the left middle cerebral artery in the M3 M4 region.  Patient was started on DAPT with ASA 81 mg qd and Plavix 75 mg qd, and an endovascular treatment for the severe stenosis of right MCA was recommended to the patient during the follow up visit, and patient presented to [place] on 12/23/20, the procedure was rescheduled due to patient's SBP in 220.  She was also hospitalized from 12/21 to 12/23 due to uncontrolled diabetes with hyperglycemia, AKI and UTI.   Patient presents to [facility] today for cerebral angiogram with intervention for the right MCA stenosis.   ***   Past Medical History:  Diagnosis Date   Allergy    Anemia    Asthma    Depression    Diabetes mellitus    Gout 12 days ago   Hypertension    Stroke [facility])     Past Surgical History:  Procedure Laterality Date   ABDOMINAL HYSTERECTOMY     ANKLE FRACTURE SURGERY     right ankle   CESAREAN SECTION     IR ANGIO INTRA EXTRACRAN SEL COM CAROTID INNOMINATE BILAT MOD SED  07/07/2020   IR ANGIO VERTEBRAL SEL VERTEBRAL UNI R MOD SED   07/07/2020   IR RADIOLOGIST EVAL & MGMT  06/19/2020   IR RADIOLOGIST EVAL & MGMT  09/22/2020   IR US GUIDE VASC ACCESS RIGHT  07/07/2020    Allergies: Brilinta [ticagrelor], Eggs or egg-derived products, Actos [pioglitazone], Glipizide, and Tramadol  Medications: Prior to Admission medications   Medication Sig Start Date End Date Taking? Authorizing Provider  amLODipine (NORVASC) 10 MG tablet Take 1 tablet (10 mg total) by mouth daily. 01/29/21  Yes Mercy Riding, MD  aspirin EC 81 MG EC tablet Take 1 tablet (81 mg total) by mouth daily. Swallow whole. Patient taking differently: Take 81 mg by mouth daily. 05/02/20  Yes Marianna Payment, MD  atorvastatin (LIPITOR) 80 MG tablet Take 1 tablet (80 mg total) by mouth daily. Patient taking differently: Take 80 mg by mouth at bedtime. 09/02/20 03/29/21 Yes McCue, Janett Billow, NP  Cholecalciferol (VITAMIN D3 PO) Take 1 capsule by mouth daily.   Yes [provider]  clopidogrel (PLAVIX) 75 MG tablet Take 1 tablet (75 mg total) by mouth daily. 09/22/20  Yes Abrianna Sidman H, PA-C  insulin aspart (NOVOLOG) 100 UNIT/ML FlexPen Inject 5 Units into the skin 3 (three) times daily with meals. Patient taking differently: Inject 30 Units into the skin 3 (three) times daily as needed for high blood sugar (if blood sugar is above 180). Sliding scale 01/29/21  Yes Mercy Riding, MD  LANTUS  SOLOSTAR 100 UNIT/ML Solostar Pen Inject 30 Units into the skin daily. Patient taking differently: Inject 50 Units into the skin daily. 01/29/21  Yes Mercy Riding, MD  losartan (COZAAR) 50 MG tablet Take 1 tablet (50 mg total) by mouth daily. Patient taking differently: Take 100 mg by mouth daily. 02/05/21  Yes Mercy Riding, MD  Magnesium 250 MG TABS Take 250 mg by mouth daily.   Yes [provider]  metFORMIN (GLUCOPHAGE) 1000 MG tablet Take 1 tablet (1,000 mg total) by mouth 2 (two) times daily with a meal. 01/29/21 07/28/21 Yes Gonfa, Charlesetta Ivory, MD  Multiple  Vitamins-Minerals (ZINC PO) Take 1 tablet by mouth at bedtime.   Yes [provider]  blood glucose meter kit and supplies KIT Dispense based on patient and insurance preference. Use up to four times daily as directed. 01/29/21   Mercy Riding, MD  cefadroxil (DURICEF) 500 MG capsule Take 1 capsule (500 mg total) by mouth 2 (two) times daily. Patient not taking: Reported on 03/29/2021 01/29/21   Mercy Riding, MD  mineral oil-hydrophilic petrolatum (AQUAPHOR) ointment Apply topically 2 (two) times daily. Patient not taking: Reported on 03/29/2021 01/29/21   Mercy Riding, MD  albuterol (PROVENTIL HFA;VENTOLIN HFA) 108 (90 Base) MCG/ACT inhaler Inhale 2 puffs into the lungs every 6 (six) hours as needed for wheezing or shortness of breath. Patient not taking: Reported on 07/03/2018 04/24/18 01/13/19  Harrie Foreman, MD     Family History  Problem Relation Age of Onset   Hypertension Mother    Diabetes Mother    Hypertension Father    Diabetes Father    Colon cancer Neg Hx     Social History   Socioeconomic History   Marital status: Single    Spouse name: Not on file   Number of children: 3   Years of education: Not on file   Highest education level: Not on file  Occupational History    Comment: preschool teacher  Tobacco Use   Smoking status: Never   Smokeless tobacco: Never  Vaping Use   Vaping Use: Never used  Substance and Sexual Activity   Alcohol use: Yes    Comment: social   Drug use: Not Currently    Types: Marijuana    Comment: 1x monthly   Sexual activity: Yes    Birth control/protection: Condom  Other Topics Concern   Not on file  Social History Narrative   Lives alone   Social Determinants of Health   Financial Resource Strain: Not on file  Food Insecurity: Not on file  Transportation Needs: Not on file  Physical Activity: Not on file  Stress: Not on file  Social Connections: Not on file     Review of Systems: A 12 point ROS discussed and  pertinent positives are indicated in the HPI above.  All other systems are negative.  Vital Signs: There were no vitals taken for this visit.  *** Physical Exam     Imaging: No results found.  Labs:  CBC: Recent Labs    12/23/20 0637 01/27/21 1008 01/27/21 1650 01/28/21 0432 01/29/21 0237  WBC 5.3 11.8*  --  8.5 6.4  HGB 12.1 14.8 14.3 12.9 11.9*  HCT 39.2 44.1 42.0 39.1 36.9  PLT 209 314  --  224 189    COAGS: Recent Labs    04/30/20 1840 07/07/20 0636 12/23/20 0637  INR 1.0 0.9 1.0  APTT 29  --   --  BMP: Recent Labs    01/27/21 1008 01/27/21 1449 01/27/21 1650 01/28/21 0432 01/29/21 0237  NA 127* 127* 131* 134* 136  K 4.4 4.6 4.2 3.8 3.8  CL 95* 94*  --  102 105  CO2 20* 23  --  24 25  GLUCOSE 715* 561*  --  192* 222*  BUN 39* 39*  --  27* 22*  CALCIUM 9.9 9.3  --  8.8* 8.6*  CREATININE 2.05* 1.78*  --  1.28* 1.20*  GFRNONAA 28* 33*  --  49* 52*    LIVER FUNCTION TESTS: Recent Labs    04/20/20 1000 04/30/20 1003 05/01/20 0349 05/19/20 1115 01/29/21 0237  BILITOT 1.0 0.7 0.4 0.8  --   AST 21 23 22 28   --   ALT 22 21 19 29   --   ALKPHOS 95 96 80 110  --   PROT 7.5 7.4 6.6 6.8  --   ALBUMIN 3.3* 3.3* 2.9* 3.1* 2.8*    TUMOR MARKERS: No results for input(s): AFPTM, CEA, CA199, CHROMGRNA in the last 8760 hours.  Assessment and Plan: 61 y.o. female with CVA in March 2022, underwent diagnostic cerebral angiogram in 07/07/20 and was found to have a severe right MCA stenosis, who is in need of endovascular treatment for the stenosis.   Patient presents to [place] today for the procedure with Dr. Estanislado Pandy.  NPO since MN VSS ** P2Y12     Thank you for this interesting consult.  I greatly enjoyed meeting Crystal Boyer and look forward to participating in their care.  A copy of this report was sent to the requesting provider on this date.  Electronically Signed: Tera Mater, PA-C 03/30/2021, 2:12 PM   I spent a total of    25  Minutes in face to face in clinical consultation, greater than 50% of which was counseling/coordinating care for ***  This chart was dictated using voice recognition software.  Despite best efforts to proofread,  errors can occur which can change the documentation meaning.

## 2021-03-30 NOTE — Telephone Encounter (Signed)
Called pt to cancel procedure, she did not show up for her p2y12 or covid test. Per Dr. Corliss Skains, she will need a p2y12 done before we can see reschedule. AW

## 2021-03-31 ENCOUNTER — Telehealth (HOSPITAL_COMMUNITY): Payer: Self-pay

## 2021-03-31 ENCOUNTER — Ambulatory Visit (HOSPITAL_COMMUNITY)
Admission: RE | Admit: 2021-03-31 | Discharge: 2021-03-31 | Disposition: A | Discharge status: Home / Self Care | Payer: Medicaid Other | Source: Ambulatory Visit | Attending: Interventional Radiology | Admitting: Interventional Radiology

## 2021-03-31 NOTE — Telephone Encounter (Signed)
Pt called to reschedule her procedure. I informed her that she will need a p2y12 done first per Dr. Corliss Skains. She will come tomorrow and get this done. AW

## 2021-04-01 ENCOUNTER — Other Ambulatory Visit (HOSPITAL_COMMUNITY): Payer: Self-pay

## 2021-04-01 DIAGNOSIS — I771 Stricture of artery: Secondary | ICD-10-CM

## 2021-04-05 ENCOUNTER — Other Ambulatory Visit (HOSPITAL_COMMUNITY): Payer: Self-pay

## 2021-04-05 DIAGNOSIS — I771 Stricture of artery: Secondary | ICD-10-CM

## 2021-04-16 LAB — PLATELET INHIBITION P2Y12

## 2021-04-22 ENCOUNTER — Telehealth (HOSPITAL_COMMUNITY): Payer: Self-pay | Admitting: Physician Assistant

## 2021-04-22 NOTE — Progress Notes (Signed)
Have tried to reach [name] 3/15 and 3/16 by phone, both home and mobile.  Unable to reach her and voicemail does not identify her as the recipient.  No message left secondary to HIPAA concerns. ? ?Will need to continue to reach out to discuss P2Y12 results and need for change of medication from Plavix to Brilinta 90mg  BID.  Additionally, will need to discuss diabetes and HTN control to consider scheduling the recommended procedure. ? ?Electronically Signed: ? , PA ?04/22/2021, 11:50 AM ? ? ?

## 2021-04-27 NOTE — Telephone Encounter (Signed)
Called and spoke with Crystal Boyer regarding recent P2Y12 lab values.  Although value was higher than preferred, Plavix still best option for anticoagulation and risk management at this time secondary to adverse reaction/allergy with Brilinta.   ?Additionally, we spoke regarding HTN and DM management.  She endorses improvement in both areas and conveys having an appt with PCP in the morning.  She will ask PCP to relay findings related to HTN and DM with Dr. Corliss Skains.  Ms Storr understands the importance of having these conditions well controlled prior to cerebral interventions. ?

## 2021-04-28 ENCOUNTER — Ambulatory Visit: Admit: 2021-04-28 | Payer: Medicaid Other | Admitting: Interventional Radiology

## 2021-04-28 SURGERY — IR WITH ANESTHESIA
Anesthesia: General

## 2021-05-03 DIAGNOSIS — R0789 Other chest pain: Secondary | ICD-10-CM | POA: Insufficient documentation

## 2021-05-03 DIAGNOSIS — R0600 Dyspnea, unspecified: Secondary | ICD-10-CM | POA: Diagnosis not present

## 2021-05-03 DIAGNOSIS — R42 Dizziness and giddiness: Secondary | ICD-10-CM | POA: Diagnosis not present

## 2021-05-03 DIAGNOSIS — Z5321 Procedure and treatment not carried out due to patient leaving prior to being seen by health care provider: Secondary | ICD-10-CM | POA: Insufficient documentation

## 2021-05-04 ENCOUNTER — Encounter (HOSPITAL_COMMUNITY): Payer: Self-pay | Admitting: Emergency Medicine

## 2021-05-04 ENCOUNTER — Other Ambulatory Visit: Payer: Self-pay

## 2021-05-04 ENCOUNTER — Emergency Department (HOSPITAL_COMMUNITY): Payer: Medicaid Other

## 2021-05-04 ENCOUNTER — Telehealth: Payer: Self-pay | Admitting: Student

## 2021-05-04 ENCOUNTER — Emergency Department (HOSPITAL_COMMUNITY)
Admission: EM | Admit: 2021-05-04 | Discharge: 2021-05-04 | Discharge status: Against Medical Advice | Payer: Medicaid Other | Attending: Student | Admitting: Student

## 2021-05-04 LAB — COMPREHENSIVE METABOLIC PANEL
ALT: 29 U/L (ref 0–44)
AST: 26 U/L (ref 15–41)
Albumin: 3.5 g/dL (ref 3.5–5.0)
Alkaline Phosphatase: 107 U/L (ref 38–126)
Anion gap: 10 (ref 5–15)
BUN: 28 mg/dL — ABNORMAL HIGH (ref 6–20)
CO2: 18 mmol/L — ABNORMAL LOW (ref 22–32)
Calcium: 9.4 mg/dL (ref 8.9–10.3)
Chloride: 108 mmol/L (ref 98–111)
Creatinine, Ser: 1.14 mg/dL — ABNORMAL HIGH (ref 0.44–1.00)
GFR, Estimated: 56 mL/min — ABNORMAL LOW (ref >60)
Glucose, Bld: 268 mg/dL — ABNORMAL HIGH (ref 70–99)
Potassium: 3.9 mmol/L (ref 3.5–5.1)
Sodium: 136 mmol/L (ref 135–145)
Total Bilirubin: 0.9 mg/dL (ref 0.3–1.2)
Total Protein: 7.6 g/dL (ref 6.5–8.1)

## 2021-05-04 LAB — CBC WITH DIFFERENTIAL/PLATELET
Abs Immature Granulocytes: 0.03 10*3/uL (ref 0.00–0.07)
Basophils Absolute: 0 10*3/uL (ref 0.0–0.1)
Basophils Relative: 0 %
Eosinophils Absolute: 0.3 10*3/uL (ref 0.0–0.5)
Eosinophils Relative: 4 %
HCT: 38 % (ref 36.0–46.0)
Hemoglobin: 11.9 g/dL — ABNORMAL LOW (ref 12.0–15.0)
Immature Granulocytes: 0 %
Lymphocytes Relative: 26 %
Lymphs Abs: 2 10*3/uL (ref 0.7–4.0)
MCH: 27.5 pg (ref 26.0–34.0)
MCHC: 31.3 g/dL (ref 30.0–36.0)
MCV: 87.8 fL (ref 80.0–100.0)
Monocytes Absolute: 0.4 10*3/uL (ref 0.1–1.0)
Monocytes Relative: 6 %
Neutro Abs: 4.7 10*3/uL (ref 1.7–7.7)
Neutrophils Relative %: 64 %
Platelets: 263 10*3/uL (ref 150–400)
RBC: 4.33 MIL/uL (ref 3.87–5.11)
RDW: 13.3 % (ref 11.5–15.5)
WBC: 7.4 10*3/uL (ref 4.0–10.5)
nRBC: 0 % (ref 0.0–0.2)

## 2021-05-04 LAB — CBG MONITORING, ED: Glucose-Capillary: 251 mg/dL — ABNORMAL HIGH (ref 70–99)

## 2021-05-04 LAB — TROPONIN I (HIGH SENSITIVITY)
Troponin I (High Sensitivity): 5 ng/L (ref <18)
Troponin I (High Sensitivity): 6 ng/L (ref <18)

## 2021-05-04 NOTE — ED Triage Notes (Signed)
Pt arrive POV for SOB, dizziness and HA all weekend long. ?

## 2021-05-04 NOTE — ED Notes (Signed)
Pt called 3x for vitals. Pt appears not to be here anymore.  ?

## 2021-05-04 NOTE — Telephone Encounter (Signed)
Patient called Interventional Radiology to notify us that she is in the Emergency Room. Her message stated she thinks her anticoagulation medication is making her sick/not working.  ED physician note states she presented with dyspnea, chest tightness, nausea and feeling light-headed.  ? ?This patient is familiar to IR from a diagnostic cerebral angiogram 07/07/20 which identified right middle cerebral artery stenosis. She had agreed to proceed with an intervention under general anesthesia but this was never arranged. At her last meeting with Dr. Corliss Skains 09/21/20 her only symptom was intermittent memory loss.  A CT  head December 2022 revealed no acute findings.  ? ?I called her back this morning at 0832 and left a voice mail and our office number so she can call us back.  ? Alwyn Ren, AGACNP-BC ?[phone] ?05/04/2021, 8:35 AM ? ? ?

## 2021-05-04 NOTE — ED Provider Triage Note (Signed)
Emergency Medicine Provider Triage Evaluation Note ? ?Crystal Boyer , a 61 y.o. female  was evaluated in triage.  Pt complains of dyspnea that began earlier tonight. Reports dyspnea with chest tightness, nausea, and lightheadedness like she might pass out. No alleviating/aggravating factors. Also reports a headache throughout the weekend.  ? ?Review of Systems  ?Positive: Dyspnea, chest tightness, nausea, lightheadedness ?Negative: Syncope, hemoptysis, vomiting.  ? ?Physical Exam  ?BP (!) 181/93 (BP Location: Right Arm)   Pulse 78   Temp 97.8 ?F (36.6 ?C)   Resp 17   Ht 5\' 6"  (1.676 m)   Wt 106.6 kg   SpO2 98%   BMI 37.93 kg/m?  ?Gen:   Awake, no distress   ?Resp:  Normal effort  ?MSK:   Moves extremities without difficulty  ?Other:  PERRL. EOMI. No facial droop. Sensation grossly intact x 4. 5/5 symmetric grip strength and strength with plantar/dorsiflexion bilaterally.  ? ?Medical Decision Making  ?Medically screening exam initiated at 12:21 AM.  Appropriate orders placed.  Crystal Boyer was informed that the remainder of the evaluation will be completed by another provider, this initial triage assessment does not replace that evaluation, and the importance of remaining in the ED until their evaluation is complete. ? ?Dyspnea ?  ?Crystal Degree, PA-C ?05/04/21 0023 ? ?

## 2021-05-10 ENCOUNTER — Other Ambulatory Visit: Payer: Self-pay | Admitting: Physician Assistant

## 2021-05-10 DIAGNOSIS — Z1231 Encounter for screening mammogram for malignant neoplasm of breast: Secondary | ICD-10-CM

## 2021-05-29 ENCOUNTER — Emergency Department (HOSPITAL_COMMUNITY)
Admission: EM | Admit: 2021-05-29 | Discharge: 2021-05-29 | Disposition: A | Discharge status: Home / Self Care | Payer: Medicaid Other | Attending: Emergency Medicine | Admitting: Emergency Medicine

## 2021-05-29 ENCOUNTER — Other Ambulatory Visit: Payer: Self-pay

## 2021-05-29 ENCOUNTER — Encounter (HOSPITAL_COMMUNITY): Payer: Self-pay | Admitting: Emergency Medicine

## 2021-05-29 DIAGNOSIS — B349 Viral infection, unspecified: Secondary | ICD-10-CM

## 2021-05-29 DIAGNOSIS — Z7982 Long term (current) use of aspirin: Secondary | ICD-10-CM | POA: Diagnosis not present

## 2021-05-29 DIAGNOSIS — I10 Essential (primary) hypertension: Secondary | ICD-10-CM | POA: Insufficient documentation

## 2021-05-29 DIAGNOSIS — Z7984 Long term (current) use of oral hypoglycemic drugs: Secondary | ICD-10-CM | POA: Diagnosis not present

## 2021-05-29 DIAGNOSIS — Z20822 Contact with and (suspected) exposure to covid-19: Secondary | ICD-10-CM | POA: Insufficient documentation

## 2021-05-29 DIAGNOSIS — R0981 Nasal congestion: Secondary | ICD-10-CM | POA: Diagnosis present

## 2021-05-29 DIAGNOSIS — E1165 Type 2 diabetes mellitus with hyperglycemia: Secondary | ICD-10-CM | POA: Insufficient documentation

## 2021-05-29 DIAGNOSIS — Z7902 Long term (current) use of antithrombotics/antiplatelets: Secondary | ICD-10-CM | POA: Diagnosis not present

## 2021-05-29 DIAGNOSIS — Z794 Long term (current) use of insulin: Secondary | ICD-10-CM | POA: Insufficient documentation

## 2021-05-29 DIAGNOSIS — Z79899 Other long term (current) drug therapy: Secondary | ICD-10-CM | POA: Insufficient documentation

## 2021-05-29 DIAGNOSIS — R739 Hyperglycemia, unspecified: Secondary | ICD-10-CM

## 2021-05-29 LAB — CBC
HCT: 39.1 % (ref 36.0–46.0)
Hemoglobin: 12.5 g/dL (ref 12.0–15.0)
MCH: 28.2 pg (ref 26.0–34.0)
MCHC: 32 g/dL (ref 30.0–36.0)
MCV: 88.1 fL (ref 80.0–100.0)
Platelets: 245 10*3/uL (ref 150–400)
RBC: 4.44 MIL/uL (ref 3.87–5.11)
RDW: 13.2 % (ref 11.5–15.5)
WBC: 5.8 10*3/uL (ref 4.0–10.5)
nRBC: 0 % (ref 0.0–0.2)

## 2021-05-29 LAB — BASIC METABOLIC PANEL
Anion gap: 6 (ref 5–15)
BUN: 21 mg/dL — ABNORMAL HIGH (ref 6–20)
CO2: 26 mmol/L (ref 22–32)
Calcium: 9.5 mg/dL (ref 8.9–10.3)
Chloride: 104 mmol/L (ref 98–111)
Creatinine, Ser: 1.1 mg/dL — ABNORMAL HIGH (ref 0.44–1.00)
GFR, Estimated: 58 mL/min — ABNORMAL LOW (ref >60)
Glucose, Bld: 326 mg/dL — ABNORMAL HIGH (ref 70–99)
Potassium: 4.9 mmol/L (ref 3.5–5.1)
Sodium: 136 mmol/L (ref 135–145)

## 2021-05-29 LAB — RESP PANEL BY RT-PCR (FLU A&B, COVID) ARPGX2
Influenza A by PCR: NEGATIVE
Influenza B by PCR: NEGATIVE
SARS Coronavirus 2 by RT PCR: NEGATIVE

## 2021-05-29 LAB — CBG MONITORING, ED: Glucose-Capillary: 228 mg/dL — ABNORMAL HIGH (ref 70–99)

## 2021-05-29 MED ORDER — LANTUS SOLOSTAR 100 UNIT/ML ~~LOC~~ SOPN: 30.0000 [IU] | PEN_INJECTOR | Freq: Every day | SUBCUTANEOUS | 1 refills | Status: DC

## 2021-05-29 MED ORDER — INSULIN ASPART 100 UNIT/ML IJ SOLN
5.0000 [IU] | Freq: Once | INTRAMUSCULAR | Status: AC
  Administered 2021-05-29: 5 [IU] via SUBCUTANEOUS

## 2021-05-29 MED ORDER — INSULIN ASPART 100 UNIT/ML FLEXPEN: 5.0000 [IU] | PEN_INJECTOR | Freq: Three times a day (TID) | SUBCUTANEOUS | 1 refills | Status: DC

## 2021-05-29 NOTE — ED Triage Notes (Signed)
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  Reports sneezing and nasal congestion. ?

## 2021-05-29 NOTE — Discharge Instructions (Addendum)
You have been evaluated for your symptoms.  Your COVID and flu test is negative.  However, sometimes the test may not pick up early onset of COVID infection.  Work note has been provided for you.  Your blood sugar is elevated today, I have refilled your medication and also request for our social worker to help obtain your medication.  Make sure to have your blood sugar rechecked at your nearest convenience. ?

## 2021-05-29 NOTE — ED Provider Notes (Signed)
?[facility] ?Provider Note ? ? ?CSN: [id number] ?Arrival date & time: 05/29/21  0827 ? ?  ? ?History ? ?Chief Complaint  ?Patient presents with  ? Nasal Congestion  ?  Sneezing  ? Covid Exposure  ? ? ?Crystal Boyer is a 61 y.o. female. ? ?The history is provided by the patient and medical records. No language interpreter was used.  ? ?This is a 61 year old female with significant history of diabetes, hypertension, prior stroke, presents with cold symptoms.  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  For the past 2 days patient endorsed having some sinus congestion and throat irritation but otherwise she denies having any fever chills shortness of breath chest pain nausea vomiting diarrhea or dysuria.  No significant cough.  Patient also admits that she have not had her diabetic medication for the past week because her Medicaid was cut off.  She is in the process of obtaining new insurance and states it would be another 2 weeks before she have access to her medication again.  She does not endorse any significant polyuria or polydipsia.  No fever.  No sneezing or itchy eyes ? ?Home Medications ?Prior to Admission medications   ?Medication Sig Start Date End Date Taking? Authorizing Provider  ?amLODipine (NORVASC) 10 MG tablet Take 1 tablet (10 mg total) by mouth daily. 01/29/21   Mercy Riding, MD  ?aspirin EC 81 MG EC tablet Take 1 tablet (81 mg total) by mouth daily. Swallow whole. ?Patient taking differently: Take 81 mg by mouth daily. 05/02/20   Marianna Payment, MD  ?atorvastatin (LIPITOR) 80 MG tablet Take 1 tablet (80 mg total) by mouth daily. ?Patient taking differently: Take 80 mg by mouth at bedtime. 09/02/20 03/29/21  Frann Rider, NP  ?blood glucose meter kit and supplies KIT Dispense based on patient and insurance preference. Use up to four times daily as directed. 01/29/21   Mercy Riding,  MD  ?cefadroxil (DURICEF) 500 MG capsule Take 1 capsule (500 mg total) by mouth 2 (two) times daily. ?Patient not taking: Reported on 03/29/2021 01/29/21   Mercy Riding, MD  ?Cholecalciferol (VITAMIN D3 PO) Take 1 capsule by mouth daily.    [provider]  ?clopidogrel (PLAVIX) 75 MG tablet Take 1 tablet (75 mg total) by mouth daily. 09/22/20   Han, Aimee H, PA-C  ?insulin aspart (NOVOLOG) 100 UNIT/ML FlexPen Inject 5 Units into the skin 3 (three) times daily with meals. ?Patient taking differently: Inject 30 Units into the skin 3 (three) times daily as needed for high blood sugar (if blood sugar is above 180). Sliding scale 01/29/21   Mercy Riding, MD  ?LANTUS SOLOSTAR 100 UNIT/ML Solostar Pen Inject 30 Units into the skin daily. ?Patient taking differently: Inject 50 Units into the skin daily. 01/29/21   Mercy Riding, MD  ?losartan (COZAAR) 50 MG tablet Take 1 tablet (50 mg total) by mouth daily. ?Patient taking differently: Take 100 mg by mouth daily. 02/05/21   Mercy Riding, MD  ?Magnesium 250 MG TABS Take 250 mg by mouth daily.    [provider]  ?metFORMIN (GLUCOPHAGE) 1000 MG tablet Take 1 tablet (1,000 mg total) by mouth 2 (two) times daily with a meal. 01/29/21 07/28/21  Mercy Riding, MD  ?mineral oil-hydrophilic petrolatum (AQUAPHOR) ointment Apply topically 2 (two) times daily. ?Patient not taking: Reported on 03/29/2021 01/29/21   Cyndia Skeeters,  Charlesetta Ivory, MD  ?Multiple Vitamins-Minerals (ZINC PO) Take 1 tablet by mouth at bedtime.    [provider]  ?albuterol (PROVENTIL HFA;VENTOLIN HFA) 108 (90 Base) MCG/ACT inhaler Inhale 2 puffs into the lungs every 6 (six) hours as needed for wheezing or shortness of breath. ?Patient not taking: Reported on 07/03/2018 04/24/18 01/13/19  Harrie Foreman, MD  ?   ? ?Allergies    ?Brilinta [ticagrelor], Eggs or egg-derived products, Actos [pioglitazone], Glipizide, and Tramadol   ? ?Review of Systems   ?Review of Systems  ?All other systems  reviewed and are negative. ? ?Physical Exam ?Updated Vital Signs ?BP (!) 153/110   Pulse 65   Temp 97.8 ?F (36.6 ?C) (Oral)   Resp 18   Ht 5' 6"  (1.676 m)   Wt 106 kg   SpO2 98%   BMI 37.72 kg/m?  ?Physical Exam ?Vitals and nursing note reviewed.  ?Constitutional:   ?   General: She is not in acute distress. ?   Appearance: She is well-developed.  ?HENT:  ?   Head: Atraumatic.  ?   Nose: Nose normal.  ?Eyes:  ?   Conjunctiva/sclera: Conjunctivae normal.  ?Cardiovascular:  ?   Rate and Rhythm: Normal rate and regular rhythm.  ?   Pulses: Normal pulses.  ?   Heart sounds: Normal heart sounds.  ?Pulmonary:  ?   Effort: Pulmonary effort is normal.  ?Abdominal:  ?   Palpations: Abdomen is soft.  ?   Tenderness: There is no abdominal tenderness.  ?Musculoskeletal:  ?   Cervical back: Neck supple.  ?Skin: ?   Findings: No rash.  ?Neurological:  ?   Mental Status: She is alert.  ?Psychiatric:     ?   Mood and Affect: Mood normal.  ? ? ?ED Results / Procedures / Treatments   ?Labs ?(all labs ordered are listed, but only abnormal results are displayed) ?Labs Reviewed  ?BASIC METABOLIC PANEL - Abnormal; Notable for the following components:  ?    Result Value  ? Glucose, Bld 326 (*)   ? BUN 21 (*)   ? Creatinine, Ser 1.10 (*)   ? GFR, Estimated 58 (*)   ? All other components within normal limits  ?CBG MONITORING, ED - Abnormal; Notable for the following components:  ? Glucose-Capillary 228 (*)   ? All other components within normal limits  ?RESP PANEL BY RT-PCR (FLU A&B, COVID) ARPGX2  ?CBC  ? ? ?EKG ?None ? ?Radiology ?No results found. ? ?Procedures ?Procedures  ? ? ?Medications Ordered in ED ?Medications  ?insulin aspart (novoLOG) injection 5 Units (has no administration in time range)  ? ? ?ED Course/ Medical Decision Making/ A&P ?  ?                        ?Medical Decision Making ?Risk ?Prescription drug management. ? ? ?BP (!) 153/110   Pulse 65   Temp 97.8 ?F (36.6 ?C) (Oral)   Resp 18   Ht 5' 6"  (1.676 m)    Wt 106 kg   SpO2 98%   BMI 37.72 kg/m?  ? ?11:24 AM ?This is a 61 year old female with history of diabetes, hypertension, presenting with cold symptoms after recent COVID exposure.  Symptoms ongoing for the past 2 days.  Patient overall well-appearing, no hypoxia, lungs are clear to auscultation.  COVID and flu test came back negative.  Labs independently reviewed interpreted by me which show evidence of hyperglycemia with  a CBG of 326 and normal anion gap.  Hyperglycemia likely in the setting of lack of insulin.  She mentions she recently was taken off her Medicaid and she is trying to get her medication through her new insurance but it would be 2 weeks before she have access to her diabetic medication.  Since her blood sugar is not well controlled, I have given insulin as treatment.  In regards to her social determinant of health, patient is lacking access to her medication.  I will also reach out to our transition of care unit to help patient's secure insulin to cover for the next 2 weeks.  I will also provide patient with a work note as a negative COVID test does not rule out COVID.  With follow-up no hypoxia and lungs are clear on auscultation I have low suspicion for pneumonia.  I have considered seasonal allergy but felt less likely ? ?12:58 PM ?Initial CBG of 326 after receiving 5 units of insulin, on recheck her CBG is 228.  We did request for social worker to talk to the patient.  Social worker did reach out to patient and will be helping with obtaining her medication.  I will also refill her insulin medication so she can have access to it.  Work note provided as patient may still have [place].  Return precaution discussed.  Patient voiced understanding and agrees with plan. ? ? ? ? ? ? ? ?Final Clinical Impression(s) / ED Diagnoses ?Final diagnoses:  ?Viral illness  ?Hyperglycemia  ? ? ?Rx / DC Orders ?ED Discharge Orders   ? ?      Ordered  ?  insulin aspart (NOVOLOG) 100 UNIT/ML FlexPen  3 times daily  with meals       ? 05/29/21 1302  ?  LANTUS SOLOSTAR 100 UNIT/ML Solostar Pen  Daily       ? 05/29/21 1302  ? ?  ?  ? ?  ? ? ?  ?Domenic Moras, PA-C ?05/29/21 1303 ? ?  ?Fredia Sorrow, MD ?05/30/21 0719 ? ?

## 2021-05-29 NOTE — Progress Notes (Signed)
Transition of Care [facility]) - Emergency Department Mini Assessment ? ? ?Patient Details  ?Name: Crystal Boyer ?MRN: [id number] ?Date of Birth: [birth year] ? ?Transition of Care (TOC) CM/SW Contact:    ?Roseanne Kaufman, RN ?Phone Number: ?05/29/2021, 1:19 PM ? ? 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 RNCM received TOC consult for medication assistance.  ? ?ED Mini Assessment: ?  ?RNCM spoke with patient via phone at bedside. Patient reports she has been without her medications: lantus, amlodipine and losartan. Patient reports she has one and half bottle left of metformin. Patient states she went to [facility] on [address] and was advised she no longer has Medicaid. ?This RNCM verified with Aaron Edelman in [facility] registration that patient was e-verified today and continues to have [place] Medicaid prepaid coverage, with an effective date of 06/06/2021.  ?This RNCM advised patient due to her having medical insurance we will not be able to provide [facility] assistance.  ? ?No additional TOC needs. ?  ? ?Patient Contact and Communications ?  ?Patient: [phone]  ?  ?  ? ?Admission diagnosis:  sneezing ?Patient Active Problem List  ? Diagnosis Date Noted  ? Acute UTI 01/28/2021  ? Uncontrolled diabetes mellitus with hyperglycemia ([place]) 01/27/2021  ? AKI (acute kidney injury) ([place]) 01/27/2021  ? Hives 01/27/2021  ? Noncompliance with medications 01/27/2021  ? Acute lower UTI 01/27/2021  ? History of CVA (cerebrovascular accident) 04/30/2020  ? Achilles tendon contracture, left 09/25/2017  ? Posterior tibial tendon dysfunction (PTTD) of right lower extremity 09/25/2017  ? DIABETES MELLITUS, TYPE II 11/27/2006  ? Essential hypertension 11/27/2006  ? 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 ?PCP:  [facility] ?Pharmacy:   ?[facility], [address] ?[place] [id number] ?Phone:  [phone] Fax: [phone] ? ?

## 2021-06-11 ENCOUNTER — Ambulatory Visit: Payer: Medicaid Other

## 2021-07-01 ENCOUNTER — Ambulatory Visit
Admission: RE | Admit: 2021-07-01 | Discharge: 2021-07-01 | Disposition: A | Discharge status: Home / Self Care | Payer: Commercial Managed Care - HMO | Source: Ambulatory Visit | Attending: Physician Assistant | Admitting: Physician Assistant

## 2021-07-01 DIAGNOSIS — Z1231 Encounter for screening mammogram for malignant neoplasm of breast: Secondary | ICD-10-CM

## 2021-08-19 ENCOUNTER — Emergency Department (HOSPITAL_COMMUNITY)
Admission: EM | Admit: 2021-08-19 | Discharge: 2021-08-19 | Discharge status: Against Medical Advice | Payer: Commercial Managed Care - HMO | Attending: Emergency Medicine | Admitting: Emergency Medicine

## 2021-08-19 ENCOUNTER — Encounter (HOSPITAL_COMMUNITY): Payer: Self-pay | Admitting: Emergency Medicine

## 2021-08-19 DIAGNOSIS — R509 Fever, unspecified: Secondary | ICD-10-CM | POA: Insufficient documentation

## 2021-08-19 DIAGNOSIS — Z5321 Procedure and treatment not carried out due to patient leaving prior to being seen by health care provider: Secondary | ICD-10-CM | POA: Insufficient documentation

## 2021-08-19 DIAGNOSIS — R1030 Lower abdominal pain, unspecified: Secondary | ICD-10-CM | POA: Insufficient documentation

## 2021-08-19 LAB — URINALYSIS, ROUTINE W REFLEX MICROSCOPIC
Bilirubin Urine: NEGATIVE
Glucose, UA: NEGATIVE mg/dL
Hgb urine dipstick: NEGATIVE
Ketones, ur: NEGATIVE mg/dL
Leukocytes,Ua: NEGATIVE
Nitrite: NEGATIVE
Protein, ur: NEGATIVE mg/dL
Specific Gravity, Urine: 1.017 (ref 1.005–1.030)
pH: 5 (ref 5.0–8.0)

## 2021-08-19 LAB — CBC
HCT: 37.7 % (ref 36.0–46.0)
Hemoglobin: 11.7 g/dL — ABNORMAL LOW (ref 12.0–15.0)
MCH: 28 pg (ref 26.0–34.0)
MCHC: 31 g/dL (ref 30.0–36.0)
MCV: 90.2 fL (ref 80.0–100.0)
Platelets: 270 10*3/uL (ref 150–400)
RBC: 4.18 MIL/uL (ref 3.87–5.11)
RDW: 13.2 % (ref 11.5–15.5)
WBC: 8.2 10*3/uL (ref 4.0–10.5)
nRBC: 0 % (ref 0.0–0.2)

## 2021-08-19 LAB — COMPREHENSIVE METABOLIC PANEL
ALT: 26 U/L (ref 0–44)
AST: 24 U/L (ref 15–41)
Albumin: 3.5 g/dL (ref 3.5–5.0)
Alkaline Phosphatase: 83 U/L (ref 38–126)
Anion gap: 6 (ref 5–15)
BUN: 19 mg/dL (ref 6–20)
CO2: 24 mmol/L (ref 22–32)
Calcium: 9.2 mg/dL (ref 8.9–10.3)
Chloride: 110 mmol/L (ref 98–111)
Creatinine, Ser: 1.08 mg/dL — ABNORMAL HIGH (ref 0.44–1.00)
GFR, Estimated: 60 mL/min — ABNORMAL LOW (ref >60)
Glucose, Bld: 106 mg/dL — ABNORMAL HIGH (ref 70–99)
Potassium: 4.5 mmol/L (ref 3.5–5.1)
Sodium: 140 mmol/L (ref 135–145)
Total Bilirubin: 0.4 mg/dL (ref 0.3–1.2)
Total Protein: 7.6 g/dL (ref 6.5–8.1)

## 2021-08-19 LAB — LIPASE, BLOOD: Lipase: 34 U/L (ref 11–51)

## 2021-08-19 NOTE — ED Provider Triage Note (Signed)
Emergency Medicine Provider Triage Evaluation Note  Crystal Boyer , a 61 y.o. female  was evaluated in triage.  Pt complains of lower abdominal pain.  Notes that she has had pain and drainage from her 61 year old C-section scar.  Has subjective fever.  No meds tried prior to arrival.  Denies urinary symptoms, nausea, vomiting.  Patient was sent by her primary care provider for abdominal pain work-up.  Review of Systems  Positive: As per HPI Negative:   Physical Exam  BP (!) 142/92 (BP Location: Left Arm)   Pulse 79   Temp 98.1 F (36.7 C) (Oral)   Resp 16   SpO2 100%  Gen:   Awake, no distress   Resp:  Normal effort  MSK:   Moves extremities without difficulty  Other:  Tenderness to palpation noted to the lower abdominal region.  No drainage or erythema noted to C-section scar.  Medical Decision Making  Medically screening exam initiated at 5:59 PM.  Appropriate orders placed.  Crystal Boyer was informed that the remainder of the evaluation will be completed by another provider, this initial triage assessment does not replace that evaluation, and the importance of remaining in the ED until their evaluation is complete.  Work-up initiated   Eulis Salazar A, PA-C 08/19/21 1800

## 2021-08-19 NOTE — ED Notes (Signed)
Patient states she thought she was getting an xray and since its not ordered she is leaving

## 2021-08-19 NOTE — ED Triage Notes (Signed)
Patient sent to ED from PCP for evaluation of pain and drainage for a 61 year old c-section scar. Patient is alert, oriented, ambulatory, and in no apparent distress at this time.

## 2021-10-18 ENCOUNTER — Other Ambulatory Visit: Payer: Self-pay

## 2021-10-18 ENCOUNTER — Encounter (HOSPITAL_COMMUNITY): Payer: Self-pay

## 2021-10-18 ENCOUNTER — Emergency Department (HOSPITAL_COMMUNITY)
Admission: EM | Admit: 2021-10-18 | Discharge: 2021-10-18 | Discharge status: Against Medical Advice | Payer: Medicaid Other | Attending: Emergency Medicine | Admitting: Emergency Medicine

## 2021-10-18 DIAGNOSIS — E1165 Type 2 diabetes mellitus with hyperglycemia: Secondary | ICD-10-CM | POA: Diagnosis present

## 2021-10-18 DIAGNOSIS — Z20822 Contact with and (suspected) exposure to covid-19: Secondary | ICD-10-CM | POA: Insufficient documentation

## 2021-10-18 DIAGNOSIS — Z5321 Procedure and treatment not carried out due to patient leaving prior to being seen by health care provider: Secondary | ICD-10-CM | POA: Insufficient documentation

## 2021-10-18 LAB — CBC WITH DIFFERENTIAL/PLATELET
Abs Immature Granulocytes: 0.03 10*3/uL (ref 0.00–0.07)
Basophils Absolute: 0.1 10*3/uL (ref 0.0–0.1)
Basophils Relative: 1 %
Eosinophils Absolute: 0.3 10*3/uL (ref 0.0–0.5)
Eosinophils Relative: 4 %
HCT: 37.2 % (ref 36.0–46.0)
Hemoglobin: 12.2 g/dL (ref 12.0–15.0)
Immature Granulocytes: 1 %
Lymphocytes Relative: 23 %
Lymphs Abs: 1.5 10*3/uL (ref 0.7–4.0)
MCH: 28.3 pg (ref 26.0–34.0)
MCHC: 32.8 g/dL (ref 30.0–36.0)
MCV: 86.3 fL (ref 80.0–100.0)
Monocytes Absolute: 0.4 10*3/uL (ref 0.1–1.0)
Monocytes Relative: 6 %
Neutro Abs: 4.4 10*3/uL (ref 1.7–7.7)
Neutrophils Relative %: 65 %
Platelets: 231 10*3/uL (ref 150–400)
RBC: 4.31 MIL/uL (ref 3.87–5.11)
RDW: 13.1 % (ref 11.5–15.5)
WBC: 6.6 10*3/uL (ref 4.0–10.5)
nRBC: 0 % (ref 0.0–0.2)

## 2021-10-18 LAB — COMPREHENSIVE METABOLIC PANEL
ALT: 27 U/L (ref 0–44)
AST: 25 U/L (ref 15–41)
Albumin: 3.1 g/dL — ABNORMAL LOW (ref 3.5–5.0)
Alkaline Phosphatase: 125 U/L (ref 38–126)
Anion gap: 8 (ref 5–15)
BUN: 23 mg/dL — ABNORMAL HIGH (ref 6–20)
CO2: 24 mmol/L (ref 22–32)
Calcium: 9.3 mg/dL (ref 8.9–10.3)
Chloride: 107 mmol/L (ref 98–111)
Creatinine, Ser: 1.38 mg/dL — ABNORMAL HIGH (ref 0.44–1.00)
GFR, Estimated: 44 mL/min — ABNORMAL LOW (ref >60)
Glucose, Bld: 241 mg/dL — ABNORMAL HIGH (ref 70–99)
Potassium: 4.6 mmol/L (ref 3.5–5.1)
Sodium: 139 mmol/L (ref 135–145)
Total Bilirubin: 0.5 mg/dL (ref 0.3–1.2)
Total Protein: 7.1 g/dL (ref 6.5–8.1)

## 2021-10-18 LAB — URINALYSIS, ROUTINE W REFLEX MICROSCOPIC
Bilirubin Urine: NEGATIVE
Glucose, UA: NEGATIVE mg/dL
Hgb urine dipstick: NEGATIVE
Ketones, ur: NEGATIVE mg/dL
Leukocytes,Ua: NEGATIVE
Nitrite: NEGATIVE
Protein, ur: NEGATIVE mg/dL
Specific Gravity, Urine: 1.019 (ref 1.005–1.030)
pH: 5 (ref 5.0–8.0)

## 2021-10-18 LAB — CBG MONITORING, ED: Glucose-Capillary: 237 mg/dL — ABNORMAL HIGH (ref 70–99)

## 2021-10-18 LAB — RESP PANEL BY RT-PCR (FLU A&B, COVID) ARPGX2
Influenza A by PCR: NEGATIVE
Influenza B by PCR: NEGATIVE
SARS Coronavirus 2 by RT PCR: NEGATIVE

## 2021-10-18 NOTE — ED Provider Triage Note (Signed)
Emergency Medicine Provider Triage Evaluation Note  Crystal Boyer , a 61 y.o. female  was evaluated in triage.  Pt complains of hyperglycemia.  Patient was sent here by her doctor for having a blood sugar over 600 in the office.  She states she has not had any change in her diabetes treatment.  She does state that she developed a cough a couple of days ago but has not had any other symptoms.  She feels fine right now.  Review of Systems  Positive:  Negative:   Physical Exam  There were no vitals taken for this visit. Gen:   Awake, no distress   Resp:  Normal effort  MSK:   Moves extremities without difficulty  Other:    Medical Decision Making  Medically screening exam initiated at 11:59 AM.  Appropriate orders placed.  ALPA SALVO was informed that the remainder of the evaluation will be completed by another provider, this initial triage assessment does not replace that evaluation, and the importance of remaining in the ED until their evaluation is complete.     Claudie Leach, PA-C 10/18/21 1200

## 2021-10-18 NOTE — ED Notes (Signed)
Pt no longer wanted to wait pt left hospital. 

## 2021-10-18 NOTE — ED Triage Notes (Signed)
Sent by PCP due to cbg >500 denies any complaints or symptoms.

## 2021-10-19 ENCOUNTER — Encounter (HOSPITAL_COMMUNITY): Payer: Self-pay

## 2021-10-19 ENCOUNTER — Emergency Department (HOSPITAL_COMMUNITY)
Admission: EM | Admit: 2021-10-19 | Discharge: 2021-10-19 | Disposition: A | Discharge status: Home / Self Care | Payer: Medicaid Other | Attending: Emergency Medicine | Admitting: Emergency Medicine

## 2021-10-19 ENCOUNTER — Ambulatory Visit (HOSPITAL_COMMUNITY)
Admission: EM | Admit: 2021-10-19 | Discharge: 2021-10-19 | Disposition: A | Discharge status: Home / Self Care | Payer: Medicaid Other | Attending: Internal Medicine | Admitting: Internal Medicine

## 2021-10-19 ENCOUNTER — Other Ambulatory Visit: Payer: Self-pay

## 2021-10-19 ENCOUNTER — Emergency Department (HOSPITAL_COMMUNITY): Payer: Medicaid Other

## 2021-10-19 DIAGNOSIS — Z794 Long term (current) use of insulin: Secondary | ICD-10-CM | POA: Diagnosis not present

## 2021-10-19 DIAGNOSIS — R739 Hyperglycemia, unspecified: Secondary | ICD-10-CM

## 2021-10-19 DIAGNOSIS — R21 Rash and other nonspecific skin eruption: Secondary | ICD-10-CM

## 2021-10-19 DIAGNOSIS — I10 Essential (primary) hypertension: Secondary | ICD-10-CM | POA: Diagnosis not present

## 2021-10-19 DIAGNOSIS — R051 Acute cough: Secondary | ICD-10-CM

## 2021-10-19 DIAGNOSIS — R059 Cough, unspecified: Secondary | ICD-10-CM | POA: Diagnosis not present

## 2021-10-19 DIAGNOSIS — R7989 Other specified abnormal findings of blood chemistry: Secondary | ICD-10-CM

## 2021-10-19 DIAGNOSIS — R0981 Nasal congestion: Secondary | ICD-10-CM | POA: Insufficient documentation

## 2021-10-19 DIAGNOSIS — E1159 Type 2 diabetes mellitus with other circulatory complications: Secondary | ICD-10-CM

## 2021-10-19 DIAGNOSIS — E860 Dehydration: Secondary | ICD-10-CM | POA: Insufficient documentation

## 2021-10-19 DIAGNOSIS — E11628 Type 2 diabetes mellitus with other skin complications: Secondary | ICD-10-CM | POA: Diagnosis not present

## 2021-10-19 DIAGNOSIS — Z79899 Other long term (current) drug therapy: Secondary | ICD-10-CM | POA: Insufficient documentation

## 2021-10-19 DIAGNOSIS — E1165 Type 2 diabetes mellitus with hyperglycemia: Secondary | ICD-10-CM | POA: Insufficient documentation

## 2021-10-19 DIAGNOSIS — Z7982 Long term (current) use of aspirin: Secondary | ICD-10-CM | POA: Insufficient documentation

## 2021-10-19 LAB — COMPREHENSIVE METABOLIC PANEL
ALT: 28 U/L (ref 0–44)
AST: 27 U/L (ref 15–41)
Albumin: 3.2 g/dL — ABNORMAL LOW (ref 3.5–5.0)
Alkaline Phosphatase: 112 U/L (ref 38–126)
Anion gap: 6 (ref 5–15)
BUN: 20 mg/dL (ref 6–20)
CO2: 24 mmol/L (ref 22–32)
Calcium: 9.2 mg/dL (ref 8.9–10.3)
Chloride: 108 mmol/L (ref 98–111)
Creatinine, Ser: 1.02 mg/dL — ABNORMAL HIGH (ref 0.44–1.00)
GFR, Estimated: >60 mL/min (ref >60)
Glucose, Bld: 199 mg/dL — ABNORMAL HIGH (ref 70–99)
Potassium: 4.5 mmol/L (ref 3.5–5.1)
Sodium: 138 mmol/L (ref 135–145)
Total Bilirubin: 0.4 mg/dL (ref 0.3–1.2)
Total Protein: 7.4 g/dL (ref 6.5–8.1)

## 2021-10-19 LAB — CBC WITH DIFFERENTIAL/PLATELET
Abs Immature Granulocytes: 0.03 10*3/uL (ref 0.00–0.07)
Basophils Absolute: 0 10*3/uL (ref 0.0–0.1)
Basophils Relative: 1 %
Eosinophils Absolute: 0.4 10*3/uL (ref 0.0–0.5)
Eosinophils Relative: 5 %
HCT: 37.9 % (ref 36.0–46.0)
Hemoglobin: 12.2 g/dL (ref 12.0–15.0)
Immature Granulocytes: 0 %
Lymphocytes Relative: 24 %
Lymphs Abs: 1.8 10*3/uL (ref 0.7–4.0)
MCH: 28.3 pg (ref 26.0–34.0)
MCHC: 32.2 g/dL (ref 30.0–36.0)
MCV: 87.9 fL (ref 80.0–100.0)
Monocytes Absolute: 0.5 10*3/uL (ref 0.1–1.0)
Monocytes Relative: 6 %
Neutro Abs: 4.7 10*3/uL (ref 1.7–7.7)
Neutrophils Relative %: 64 %
Platelets: 235 10*3/uL (ref 150–400)
RBC: 4.31 MIL/uL (ref 3.87–5.11)
RDW: 13.3 % (ref 11.5–15.5)
WBC: 7.4 10*3/uL (ref 4.0–10.5)
nRBC: 0 % (ref 0.0–0.2)

## 2021-10-19 LAB — CBG MONITORING, ED
Glucose-Capillary: 229 mg/dL — ABNORMAL HIGH (ref 70–99)
Glucose-Capillary: 179 mg/dL — ABNORMAL HIGH (ref 70–99)

## 2021-10-19 LAB — URINALYSIS, ROUTINE W REFLEX MICROSCOPIC
Bilirubin Urine: NEGATIVE
Glucose, UA: NEGATIVE mg/dL
Hgb urine dipstick: NEGATIVE
Ketones, ur: NEGATIVE mg/dL
Leukocytes,Ua: NEGATIVE
Nitrite: NEGATIVE
Protein, ur: NEGATIVE mg/dL
Specific Gravity, Urine: 1.018 (ref 1.005–1.030)
pH: 5 (ref 5.0–8.0)

## 2021-10-19 LAB — POCT URINALYSIS DIPSTICK, ED / UC
Bilirubin Urine: NEGATIVE
Glucose, UA: NEGATIVE mg/dL
Hgb urine dipstick: NEGATIVE
Ketones, ur: NEGATIVE mg/dL
Leukocytes,Ua: NEGATIVE
Nitrite: NEGATIVE
Protein, ur: NEGATIVE mg/dL
Specific Gravity, Urine: 1.02 (ref 1.005–1.030)
Urobilinogen, UA: 0.2 mg/dL (ref 0.0–1.0)
pH: 5.5 (ref 5.0–8.0)

## 2021-10-19 MED ORDER — SODIUM CHLORIDE 0.9 % IV BOLUS: 1000.0000 mL | Freq: Once | INTRAVENOUS | Status: DC

## 2021-10-19 NOTE — Discharge Instructions (Addendum)
As discussed, your evaluation today has been largely reassuring.  But, it is important that you monitor your condition carefully, and do not hesitate to return to the ED if you develop new, or concerning changes in your condition. ? ?Otherwise, please follow-up with your physician for appropriate ongoing care. ? ?

## 2021-10-19 NOTE — Discharge Instructions (Addendum)
Please go to the nearest emergency department for further evaluation and IV fluids for rehydration due to abnormal kidney function labs from yesterday.

## 2021-10-19 NOTE — ED Provider Notes (Addendum)
[facility]    CSN: [id number] Arrival date & time: 10/19/21  0802      History   Chief Complaint Chief Complaint  Patient presents with   Cough   Blood Sugar Problem   Rash    Back x 1 week    HPI Crystal Boyer is a 61 y.o. female.   Patient presents to urgent care for evaluation after elevated blood sugar reading yesterday that was 590.  Patient states that her blood sugar is not usually this elevated and she has been taking her Lantus and insulin aspart daily as prescribed due to her type 2 diabetes.  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  Reports increased thirst as well as increased urinary frequency over the last few weeks as well. States she "just doesn't feel herself". Went to the ED yesterday for symptoms but left prior to being seen by provider due to wait time. Patient is also reporting a productive cough with yellow mucus and runny nose for the last 10 days.  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 States she has been "more tired than normal" and denies headache, nausea, vomiting, ear pain, sore throat, and fever/chills.  She is not a smoker and denies drug use. Denies history of asthma/COPD. No known sick contacts other than possible exposure to illness at daycare. Also reporting itching to her upper back that started 1 week ago with associated rash. Every time she goes to her friend's house she "gets to itching real bad" and states she "feels like there's something crawling on her". Rash is not draining. Reports there are also a lot of mosquitos on the playground at the daycare where she works and wonders if this could be the cause of the itching. Hot water helps relieve itch. Denies OTC meds prior to arrival at urgent care for itching, rash, and URI symptoms.      Past Medical History:  Diagnosis Date    Allergy    Anemia    Asthma    Depression    Diabetes mellitus    Gout 12 days ago   Hypertension    Stroke [facility])     Patient Active Problem List   Diagnosis Date Noted   Acute UTI 01/28/2021   Uncontrolled diabetes mellitus with hyperglycemia ([place]) 01/27/2021   AKI (acute kidney injury) ([place]) 01/27/2021   Hives 01/27/2021   Noncompliance with medications 01/27/2021   Acute lower UTI 01/27/2021   History of CVA (cerebrovascular accident) 04/30/2020   Achilles tendon contracture, left 09/25/2017   Posterior tibial tendon dysfunction (PTTD) of right lower extremity 09/25/2017   DIABETES MELLITUS, TYPE II 11/27/2006   Essential hypertension 11/27/2006   MICROALBUMINURIA 11/27/2006    Past Surgical History:  Procedure Laterality Date   ABDOMINAL HYSTERECTOMY     ANKLE FRACTURE SURGERY     right ankle   CESAREAN SECTION     IR ANGIO INTRA EXTRACRAN SEL COM CAROTID INNOMINATE BILAT MOD SED  07/07/2020   IR ANGIO VERTEBRAL SEL VERTEBRAL UNI R MOD SED  07/07/2020   IR RADIOLOGIST EVAL & MGMT  06/19/2020   IR RADIOLOGIST EVAL & MGMT  09/22/2020   IR US GUIDE VASC ACCESS RIGHT  07/07/2020    OB History   No obstetric history on file.      Home Medications  Prior to Admission medications   Medication Sig Start Date End Date Taking? Authorizing Provider  aspirin EC 81 MG EC tablet Take 1 tablet (81 mg total) by mouth daily. Swallow whole. Patient taking differently: Take 81 mg by mouth daily. 05/02/20  Yes Marianna Payment, MD  insulin aspart (NOVOLOG) 100 UNIT/ML FlexPen Inject 5 Units into the skin 3 (three) times daily with meals. Patient not taking: Reported on 10/19/2021 05/29/21  Yes Domenic Moras, PA-C  LANTUS SOLOSTAR 100 UNIT/ML Solostar Pen Inject 30 Units into the skin daily. 05/29/21  Yes Domenic Moras, PA-C  amLODipine (NORVASC) 5 MG tablet Take 5 mg by mouth 2 (two) times daily. 06/04/21   [provider]  ascorbic acid (VITAMIN C) 500 MG tablet Take 500 mg by mouth  daily.    [provider]  atorvastatin (LIPITOR) 80 MG tablet Take 1 tablet (80 mg total) by mouth daily. Patient taking differently: Take 80 mg by mouth at bedtime. 09/02/20 10/19/21  Frann Rider, NP  blood glucose meter kit and supplies KIT Dispense based on patient and insurance preference. Use up to four times daily as directed. 01/29/21   Mercy Riding, MD  losartan (COZAAR) 100 MG tablet Take 100 mg by mouth daily. 07/29/21   [provider]  albuterol (PROVENTIL HFA;VENTOLIN HFA) 108 (90 Base) MCG/ACT inhaler Inhale 2 puffs into the lungs every 6 (six) hours as needed for wheezing or shortness of breath. Patient not taking: Reported on 07/03/2018 04/24/18 01/13/19  Harrie Foreman, MD    Family History Family History  Problem Relation Age of Onset   Hypertension Mother    Diabetes Mother    Hypertension Father    Diabetes Father    Colon cancer Neg Hx     Social History Social History   Tobacco Use   Smoking status: Never   Smokeless tobacco: Never  Vaping Use   Vaping Use: Never used  Substance Use Topics   Alcohol use: Yes    Comment: social   Drug use: Not Currently    Types: Marijuana    Comment: 1x monthly     Allergies   Brilinta [ticagrelor], Eggs or egg-derived products, Actos [pioglitazone], Glipizide, and Tramadol   Review of Systems Review of Systems Per HPI  Physical Exam Triage Vital Signs ED Triage Vitals  Enc Vitals Group     BP 10/19/21 0822 132/86     Pulse Rate 10/19/21 0822 70     Resp 10/19/21 0822 16     Temp 10/19/21 0822 98.1 F (36.7 C)     Temp Source 10/19/21 0822 Oral     SpO2 10/19/21 0822 95 %     Weight --      Height --      Head Circumference --      Peak Flow --      Pain Score 10/19/21 0823 0     Pain Loc --      Pain Edu? --      Excl. in [place]? --    No data found.  Updated Vital Signs BP 132/86 (BP Location: Left Arm)   Pulse 70   Temp 98.1 F (36.7 C) (Oral)   Resp 16   SpO2 95%    Visual Acuity Right Eye Distance:   Left Eye Distance:   Bilateral Distance:    Right Eye Near:   Left Eye Near:    Bilateral Near:     Physical Exam Vitals and nursing note reviewed.  Constitutional:      Appearance: Normal appearance. She is obese. She is ill-appearing. She is not toxic-appearing.     Comments: Very pleasant patient sitting on exam in position of comfort table in no acute distress.   HENT:     Head: Normocephalic and atraumatic.     Right Ear: Hearing, tympanic membrane, ear canal and external ear normal.     Left Ear: Hearing, tympanic membrane, ear canal and external ear normal.     Nose: Nose normal.     Mouth/Throat:     Lips: Pink.     Mouth: Mucous membranes are dry.     Pharynx: No oropharyngeal exudate or posterior oropharyngeal erythema.  Eyes:     General: Lids are normal. Vision grossly intact. Gaze aligned appropriately.     Extraocular Movements: Extraocular movements intact.     Conjunctiva/sclera: Conjunctivae normal.  Cardiovascular:     Rate and Rhythm: Normal rate and regular rhythm.     Heart sounds: Normal heart sounds, S1 normal and S2 normal.  Pulmonary:     Effort: Pulmonary effort is normal. No respiratory distress.     Breath sounds: Normal breath sounds and air entry.  Abdominal:     General: Bowel sounds are normal.     Palpations: Abdomen is soft.     Tenderness: There is no abdominal tenderness.  Musculoskeletal:     Cervical back: Normal range of motion and neck supple. No tenderness.  Lymphadenopathy:     Cervical: Cervical adenopathy present.  Skin:    General: Skin is warm and dry.     Capillary Refill: Capillary refill takes less than 2 seconds.     Findings: Rash present.     Comments: Rash to the left upper back present that consists of areas of hyperpigmentation as shown in image below. No excoriation, drainage, erythema, or surrounding soft tissue swelling to the rash.   Neurological:     General: No focal  deficit present.     Mental Status: She is alert and oriented to person, place, and time. Mental status is at baseline.     Cranial Nerves: No dysarthria or facial asymmetry.     Motor: No weakness.     Gait: Gait is intact. Gait normal.  Psychiatric:        Mood and Affect: Mood normal.        Speech: Speech normal.        Behavior: Behavior normal.        Thought Content: Thought content normal.        Judgment: Judgment normal.         UC Treatments / Results  Labs (all labs ordered are listed, but only abnormal results are displayed) Labs Reviewed  CBG MONITORING, ED - Abnormal; Notable for the following components:      Result Value   Glucose-Capillary 229 (*)    All other components within normal limits  POCT URINALYSIS DIPSTICK, ED / UC    EKG   Radiology No results found.  Procedures Procedures (including critical care time)  Medications Ordered in UC Medications - No data to display  Initial Impression / Assessment and Plan / UC Course  I have reviewed the triage vital signs and the nursing notes.  Pertinent labs & imaging results that were available during my care of the patient were reviewed by me and considered in my medical decision making (see chart for details).   1. Elevated blood sugar level and elevated  serum creatinine CMP drawn yesterday at ED visit shows elevated creatinine at 1.38 from 1.08, elevated BUN at 23 from 19, and GFR reduced to 44 from 60. CBG elevated at 229 in clinic today. Patient appears dry to physical exam, although vitals are stable. She would benefit from IV rehydration at this point due to possible AKI. Tolerating PO fluids well at this point, although has not been successful with PO rehydration at home despite increased thirst and water intake. Recommend patient go to the nearest emergency department for further evaluation of possible AKI and repeat urgent blood work to rule out worsening dehydration. She is alert and  neurologically intact to baseline without confusion.  Discussed recommendations with patient who verbalizes understanding and agreement with plan. Discussed risks of deferring ED visit and patient agrees to going to ED for further evaluation. Patient discharged to ED from urgent care via POV in stable condition.   Final Clinical Impressions(s) / UC Diagnoses   Final diagnoses:  Elevated blood sugar level  Elevated serum creatinine  Acute cough  Rash and nonspecific skin eruption  Type 2 diabetes mellitus with hyperglycemia, with long-term current use of insulin [facility])     Discharge Instructions      Please go to the nearest emergency department for further evaluation and IV fluids for rehydration due to abnormal kidney function labs from yesterday.    ED Prescriptions   None    PDMP not reviewed this encounter.   Talbot Grumbling, FNP 10/21/21 2004    Talbot Grumbling, FNP 10/21/21 2005

## 2021-10-19 NOTE — ED Triage Notes (Signed)
Patient being seen today for elevated blood sugar.   Patient having productive cough with yellow mucus and chest congestion. Onset 2-3 days. 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   Patient tested negative for flu and COVID yesterday.

## 2021-10-19 NOTE — ED Triage Notes (Addendum)
Pt. Stated, I started having congestion, cough, and dehydrated . I went to UC and they sent me here for dehydration so they sent me down here for further evaluation. Pt was tested for COVID - negative.

## 2021-10-19 NOTE — ED Provider Notes (Signed)
[facility] EMERGENCY DEPARTMENT Provider Note   CSN: [id number] Arrival date & time: 10/19/21  1020     History  Chief Complaint  Patient presents with   Dehydration   Weakness   Cough   Nasal Congestion    Crystal Boyer is a 61 y.o. female.  HPI Patient presents with concern of increased thirst, polyuria, hyperglycemia.  Patient was seen in urgent care earlier today due to these concerns and was sent here due to what she reports to have been abnormal creatinine value.  No focal pain, no syncope, no vomiting, there are some anorexia    Home Medications Prior to Admission medications   Medication Sig Start Date End Date Taking? Authorizing Provider  amLODipine (NORVASC) 5 MG tablet Take 5 mg by mouth 2 (two) times daily. 06/04/21  Yes [provider]  ascorbic acid (VITAMIN C) 500 MG tablet Take 500 mg by mouth daily.   Yes [provider]  aspirin EC 81 MG EC tablet Take 1 tablet (81 mg total) by mouth daily. Swallow whole. Patient taking differently: Take 81 mg by mouth daily. 05/02/20  Yes Marianna Payment, MD  atorvastatin (LIPITOR) 80 MG tablet Take 1 tablet (80 mg total) by mouth daily. Patient taking differently: Take 80 mg by mouth at bedtime. 09/02/20 10/19/21 Yes McCue, Janett Billow, NP  LANTUS SOLOSTAR 100 UNIT/ML Solostar Pen Inject 30 Units into the skin daily. 05/29/21  Yes Domenic Moras, PA-C  losartan (COZAAR) 100 MG tablet Take 100 mg by mouth daily. 07/29/21  Yes [provider]  blood glucose meter kit and supplies KIT Dispense based on patient and insurance preference. Use up to four times daily as directed. 01/29/21   Mercy Riding, MD  insulin aspart (NOVOLOG) 100 UNIT/ML FlexPen Inject 5 Units into the skin 3 (three) times daily with meals. Patient not taking: Reported on 10/19/2021 05/29/21   Domenic Moras, PA-C  albuterol (PROVENTIL HFA;VENTOLIN HFA) 108 [phone] Base) MCG/ACT inhaler Inhale 2 puffs into the lungs every 6 (six)  hours as needed for wheezing or shortness of breath. Patient not taking: Reported on 07/03/2018 04/24/18 01/13/19  Harrie Foreman, MD      Allergies    Brilinta [ticagrelor], Eggs or egg-derived products, Actos [pioglitazone], Glipizide, and Tramadol    Review of Systems   Review of Systems  All other systems reviewed and are negative.   Physical Exam Updated Vital Signs BP 130/74   Pulse 79   Temp 98.2 F (36.8 C) (Oral)   Resp 18   Ht 5' 6.5" (1.689 m)   Wt 108.9 kg   SpO2 99%   BMI 38.16 kg/m  Physical Exam Vitals and nursing note reviewed.  Constitutional:      General: She is not in acute distress.    Appearance: She is well-developed.  HENT:     Head: Normocephalic and atraumatic.  Eyes:     Conjunctiva/sclera: Conjunctivae normal.  Cardiovascular:     Rate and Rhythm: Normal rate and regular rhythm.  Pulmonary:     Effort: Pulmonary effort is normal. No respiratory distress.     Breath sounds: Normal breath sounds. No stridor.  Abdominal:     General: There is no distension.  Skin:    General: Skin is warm and dry.  Neurological:     Mental Status: She is alert and oriented to person, place, and time.     Cranial Nerves: No cranial nerve deficit.  Psychiatric:  Mood and Affect: Mood normal.     ED Results / Procedures / Treatments   Labs (all labs ordered are listed, but only abnormal results are displayed) Labs Reviewed  COMPREHENSIVE METABOLIC PANEL - Abnormal; Notable for the following components:      Result Value   Glucose, Bld 199 (*)    Creatinine, Ser 1.02 (*)    Albumin 3.2 (*)    All other components within normal limits  URINALYSIS, ROUTINE W REFLEX MICROSCOPIC - Abnormal; Notable for the following components:   APPearance HAZY (*)    All other components within normal limits  CBG MONITORING, ED - Abnormal; Notable for the following components:   Glucose-Capillary 179 (*)    All other components within normal limits  SARS  CORONAVIRUS 2 BY RT PCR  CBC WITH DIFFERENTIAL/PLATELET     Radiology DG Chest 2 View  Result Date: 10/19/2021 CLINICAL DATA:  Productive cough. EXAM: CHEST - 2 VIEW COMPARISON:  05/04/2021 FINDINGS: The heart size and mediastinal contours are within normal limits. There is no evidence of pulmonary edema, consolidation, pneumothorax, nodule or pleural fluid. The visualized skeletal structures are unremarkable. IMPRESSION: No active cardiopulmonary disease. Electronically Signed   By: Aletta Edouard M.D.   On: 10/19/2021 11:34    Procedures Procedures    Medications Ordered in ED Medications  sodium chloride 0.9 % bolus 1,000 mL (has no administration in time range)    ED Course/ Medical Decision Making/ A&P This patient with a Hx of insulin-dependent diabetes, prior AKI, CVA presents to the ED for concern of polydipsia, polyuria, weakness, this involves an extensive number of treatment options, and is a complaint that carries with it a high risk of complications and morbidity.    The differential diagnosis includes hydration, DKA, hyperglycemia, nonketotic hyperosmolar state   Social Determinants of Health:  No limiting factors  Additional history obtained:  Additional history and/or information obtained from chart review, notable for urgent care from earlier in the day, notable for hyperglycemia yesterday   After the initial evaluation, orders, including: Labs fluids were initiated.   Patient placed on Cardiac and Pulse-Oximetry Monitors. The patient was maintained on a cardiac monitor.  The cardiac monitored showed an rhythm of 70 sinus normal The patient was also maintained on pulse oximetry. The readings were typically 100% room air normal   On repeat evaluation of the patient stayed the same  Lab Tests:  I personally interpreted labs.  The pertinent results include: Hyperglycemia, mild creatinine elevation 2:47 PM Patient awake, alert, in no distress, smiling,  color is better she has no ongoing complaints   dispostion / Final MDM:  After consideration of the diagnostic results and the patient's response to treatment, female with diabetes, hypertension presents with 5 BP, polyuria, initial concern for DKA, nonketotic hyperosmolar state, renal dysfunction.  Patient's evaluation generally reassuring, no evidence for these entities, some suspicion for dehydration, hyperglycemia.  Patient has scheduled follow-up tomorrow, with reassuring findings here, hospitalization not required, patient discharged in stable condition.   Final Clinical Impression(s) / ED Diagnoses Final diagnoses:  Hyperglycemia     Carmin Muskrat, MD 10/19/21 1447

## 2021-10-19 NOTE — ED Provider Triage Note (Signed)
Emergency Medicine Provider Triage Evaluation Note  Crystal Boyer , a 61 y.o. female  was evaluated in triage.  Pt complains of elevated blood sugar and fatigue. She was sent over from the ER for "IV fluids". Upon reading the UC note, she was seen for itching rash, but doesn't mention any of this to me. She denies any nausea, vomiting, or diarrhea. Unsure of why she was sent over here, but the note is not finished from UC. She reports a productive cough.   Review of Systems  Positive:  Negative:   Physical Exam  BP 121/85 (BP Location: Right Arm)   Pulse 71   Temp 98.2 F (36.8 C) (Oral)   Resp 20   Ht 5' 6.5" (1.689 m)   Wt 108.9 kg   SpO2 100%   BMI 38.16 kg/m  Gen:   Awake, no distress   Resp:  Normal effort  MSK:   Moves extremities without difficulty  Other:    Medical Decision Making  Medically screening exam initiated at 11:06 AM.  Appropriate orders placed.  AMEILA WELDON was informed that the remainder of the evaluation will be completed by another provider, this initial triage assessment does not replace that evaluation, and the importance of remaining in the ED until their evaluation is complete.  Labs + CXR   Achille Rich, [place] 10/19/21 1111

## 2021-10-19 NOTE — ED Notes (Signed)
Patient verbalizes understanding of discharge instructions. Opportunity for questioning and answers were provided. Armband removed by staff, pt discharged from ED. Pt ambulatory to ED waiting room with steady gait.  

## 2022-01-29 ENCOUNTER — Ambulatory Visit (HOSPITAL_COMMUNITY)
Admission: EM | Admit: 2022-01-29 | Discharge: 2022-01-29 | Disposition: A | Discharge status: Home / Self Care | Payer: Medicaid Other

## 2022-01-29 ENCOUNTER — Encounter (HOSPITAL_COMMUNITY): Payer: Self-pay | Admitting: Emergency Medicine

## 2022-01-29 ENCOUNTER — Other Ambulatory Visit: Payer: Self-pay

## 2022-01-29 ENCOUNTER — Emergency Department (HOSPITAL_COMMUNITY)
Admission: EM | Admit: 2022-01-29 | Discharge: 2022-01-29 | Discharge status: Against Medical Advice | Payer: Medicaid Other | Attending: Emergency Medicine | Admitting: Emergency Medicine

## 2022-01-29 ENCOUNTER — Emergency Department (HOSPITAL_COMMUNITY): Payer: Medicaid Other

## 2022-01-29 DIAGNOSIS — R0781 Pleurodynia: Secondary | ICD-10-CM | POA: Insufficient documentation

## 2022-01-29 DIAGNOSIS — W19XXXA Unspecified fall, initial encounter: Secondary | ICD-10-CM | POA: Diagnosis not present

## 2022-01-29 DIAGNOSIS — M25552 Pain in left hip: Secondary | ICD-10-CM | POA: Diagnosis not present

## 2022-01-29 DIAGNOSIS — Y92002 Bathroom of unspecified non-institutional (private) residence single-family (private) house as the place of occurrence of the external cause: Secondary | ICD-10-CM | POA: Insufficient documentation

## 2022-01-29 DIAGNOSIS — M25522 Pain in left elbow: Secondary | ICD-10-CM | POA: Diagnosis not present

## 2022-01-29 DIAGNOSIS — Z7902 Long term (current) use of antithrombotics/antiplatelets: Secondary | ICD-10-CM | POA: Insufficient documentation

## 2022-01-29 DIAGNOSIS — M25512 Pain in left shoulder: Secondary | ICD-10-CM | POA: Insufficient documentation

## 2022-01-29 DIAGNOSIS — Z5321 Procedure and treatment not carried out due to patient leaving prior to being seen by health care provider: Secondary | ICD-10-CM | POA: Diagnosis not present

## 2022-01-29 DIAGNOSIS — R519 Headache, unspecified: Secondary | ICD-10-CM | POA: Diagnosis not present

## 2022-01-29 DIAGNOSIS — R42 Dizziness and giddiness: Secondary | ICD-10-CM

## 2022-01-29 DIAGNOSIS — W01198A Fall on same level from slipping, tripping and stumbling with subsequent striking against other object, initial encounter: Secondary | ICD-10-CM | POA: Diagnosis not present

## 2022-01-29 NOTE — ED Provider Notes (Addendum)
[place]   [id number] 01/29/22 Arrival Time: 1202  Chief Complaint  Patient presents with   Dizziness    SUBJECTIVE:  Crystal Boyer is a 61 y.o. female who presented to the care with a complaint of dizziness and multifocal fall at home today.  Denies a precipitating event, trauma, or recent URI within the past month.  Describes the dizziness feeling unsteady when walking.  States that it is intermittent with episodes lasting few minutes to hours.  Has not tried any medication..  Denies aggravating or alleviating factors.  Denies previous symptoms that improved.  Denies fever, chills, nausea, vomiting, hearing changes, tinnitus, ear pain, chest pain,  SOB, weakness, slurred speech, memory or emotional changes, facial drooping/ asymmetry, incoordination, numbness or tingling, abdominal pain, changes in bowel or bladder habits.  ROS: As per HPI.  All other pertinent ROS negative.      Past Medical History:  Diagnosis Date   Allergy    Anemia    Asthma    Depression    Diabetes mellitus    Gout 12 days ago   Hypertension    Stroke [facility])    Past Surgical History:  Procedure Laterality Date   ABDOMINAL HYSTERECTOMY     ANKLE FRACTURE SURGERY     right ankle   CESAREAN SECTION     IR ANGIO INTRA EXTRACRAN SEL COM CAROTID INNOMINATE BILAT MOD SED  07/07/2020   IR ANGIO VERTEBRAL SEL VERTEBRAL UNI R MOD SED  07/07/2020   IR RADIOLOGIST EVAL & MGMT  06/19/2020   IR RADIOLOGIST EVAL & MGMT  09/22/2020   IR US GUIDE VASC ACCESS RIGHT  07/07/2020   Allergies  Allergen Reactions   Brilinta [Ticagrelor] Shortness Of Breath   Eggs Or Egg-Derived Products Diarrhea   Actos [Pioglitazone] Other (See Comments)    Made the patient feel like she was "going to die"   Glipizide Other (See Comments)    Made patient feel like she was "going to pass out and die"   Tramadol Rash   No current facility-administered medications on file prior to encounter.   Current Outpatient  Medications on File Prior to Encounter  Medication Sig Dispense Refill   amLODipine (NORVASC) 5 MG tablet Take 5 mg by mouth 2 (two) times daily.     ascorbic acid (VITAMIN C) 500 MG tablet Take 500 mg by mouth daily.     aspirin EC 81 MG EC tablet Take 1 tablet (81 mg total) by mouth daily. Swallow whole. (Patient taking differently: Take 81 mg by mouth daily.) 30 tablet 11   atorvastatin (LIPITOR) 80 MG tablet Take 1 tablet (80 mg total) by mouth daily. (Patient taking differently: Take 80 mg by mouth at bedtime.) 90 tablet 3   blood glucose meter kit and supplies KIT Dispense based on patient and insurance preference. Use up to four times daily as directed. 1 each 0   insulin aspart (NOVOLOG) 100 UNIT/ML FlexPen Inject 5 Units into the skin 3 (three) times daily with meals. (Patient not taking: Reported on 10/19/2021) 15 mL 1   LANTUS SOLOSTAR 100 UNIT/ML Solostar Pen Inject 30 Units into the skin daily. 15 mL 1   losartan (COZAAR) 100 MG tablet Take 100 mg by mouth daily.     [DISCONTINUED] albuterol (PROVENTIL HFA;VENTOLIN HFA) 108 (90 Base) MCG/ACT inhaler Inhale 2 puffs into the lungs every 6 (six) hours as needed for wheezing or shortness of breath. (Patient not taking: Reported on 07/03/2018) 1 Inhaler 1  Social History   Socioeconomic History   Marital status: Single    Spouse name: Not on file   Number of children: 3   Years of education: Not on file   Highest education level: Not on file  Occupational History    Comment: preschool teacher  Tobacco Use   Smoking status: Never   Smokeless tobacco: Never  Vaping Use   Vaping Use: Never used  Substance and Sexual Activity   Alcohol use: Yes    Comment: social   Drug use: Not Currently    Types: Marijuana    Comment: 1x monthly   Sexual activity: Yes    Birth control/protection: Condom  Other Topics Concern   Not on file  Social History Narrative   Lives alone   Social Determinants of Health   Financial Resource  Strain: Not on file  Food Insecurity: Not on file  Transportation Needs: Not on file  Physical Activity: Not on file  Stress: Not on file  Social Connections: Not on file  Intimate Partner Violence: Not on file   Family History  Problem Relation Age of Onset   Hypertension Mother    Diabetes Mother    Hypertension Father    Diabetes Father    Colon cancer Neg Hx     OBJECTIVE:  Vitals:   01/29/22 1443  BP: (!) 162/101  Pulse: 85  Resp: 18  Temp: 98.3 F (36.8 C)  TempSrc: Oral  SpO2: 97%    Physical Exam Vitals and nursing note reviewed.  Constitutional:      General: She is not in acute distress.    Appearance: Normal appearance. She is normal weight. She is not ill-appearing, toxic-appearing or diaphoretic.  HENT:     Head: Normocephalic.  Cardiovascular:     Rate and Rhythm: Normal rate and regular rhythm.     Pulses: Normal pulses.     Heart sounds: Normal heart sounds. No murmur heard.    No friction rub. No gallop.  Pulmonary:     Effort: Pulmonary effort is normal. No respiratory distress.     Breath sounds: Normal breath sounds. No stridor. No wheezing, rhonchi or rales.  Chest:     Chest wall: No tenderness.  Neurological:     Mental Status: She is alert and oriented to person, place, and time.     GCS: GCS eye subscore is 4. GCS verbal subscore is 5. GCS motor subscore is 6.     Cranial Nerves: Cranial nerves 2-12 are intact.     Sensory: Sensation is intact.     Motor: Motor function is intact.     Coordination: Coordination is intact.      Labs:  No results found for this or any previous visit (from the past 24 hour(s)). Orders placed or performed during the hospital encounter of 01/29/22   EKG 12-Lead   EKG 12-Lead    EKG normal sinus rhythm without ST elevations, depressions, or prolonged PR interval.  Recent EKG was compared with EKG of May 04, 2021 No narrowing or widening of the QRS complexes.   I have personally reviewed the EKG  completed in the ED.         CT Head Wo Contrast  Result Date: 01/29/2022 CLINICAL DATA:  Head trauma EXAM: CT HEAD WITHOUT CONTRAST CT CERVICAL SPINE WITHOUT CONTRAST TECHNIQUE: Multidetector CT imaging of the head and cervical spine was performed following the standard protocol without intravenous contrast. Multiplanar CT image reconstructions of the cervical spine  were also generated. RADIATION DOSE REDUCTION: This exam was performed according to the departmental dose-optimization program which includes automated exposure control, adjustment of the mA and/or kV according to patient size and/or use of iterative reconstruction technique. COMPARISON:  Head CT 01/27/2021 FINDINGS: CT HEAD FINDINGS Brain: There is no evidence for acute hemorrhage, hydrocephalus, mass lesion, or abnormal extra-axial fluid collection. No definite CT evidence for acute infarction. Patchy low attenuation in the deep hemispheric and periventricular white matter is nonspecific, but likely reflects chronic microvascular ischemic demyelination. Vascular: No hyperdense vessel or unexpected calcification. Skull: No evidence for fracture. No worrisome lytic or sclerotic lesion. Sinuses/Orbits: The visualized paranasal sinuses and mastoid air cells are clear. Visualized portions of the globes and intraorbital fat are unremarkable. Other: None CT CERVICAL SPINE FINDINGS Alignment: Normal. Skull base and vertebrae: No acute fracture. No primary bone lesion or focal pathologic process. Soft tissues and spinal canal: No prevertebral fluid or swelling. No visible canal hematoma. Disc levels: Loss of disc height noted C5-6 with endplate spurring scattered facet osteoarthritis noted bilaterally. Upper chest: Unremarkable Other: None. IMPRESSION: 1. No acute intracranial abnormality. 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 2. No cervical spine fracture or subluxation. 3. Degenerative changes in the cervical spine as above.  Electronically Signed   By: Misty Stanley M.D.   On: 01/29/2022 10:13   CT Cervical Spine Wo Contrast  Result Date: 01/29/2022 CLINICAL DATA:  Head trauma EXAM: CT HEAD WITHOUT CONTRAST CT CERVICAL SPINE WITHOUT CONTRAST TECHNIQUE: Multidetector CT imaging of the head and cervical spine was performed following the standard protocol without intravenous contrast. Multiplanar CT image reconstructions of the cervical spine were also generated. RADIATION DOSE REDUCTION: This exam was performed according to the departmental dose-optimization program which includes automated exposure control, adjustment of the mA and/or kV according to patient size and/or use of iterative reconstruction technique. COMPARISON:  Head CT 01/27/2021 FINDINGS: CT HEAD FINDINGS Brain: There is no evidence for acute hemorrhage, hydrocephalus, mass lesion, or abnormal extra-axial fluid collection. No definite CT evidence for acute infarction. Patchy low attenuation in the deep hemispheric and periventricular white matter is nonspecific, but likely reflects chronic microvascular ischemic demyelination. Vascular: No hyperdense vessel or unexpected calcification. Skull: No evidence for fracture. No worrisome lytic or sclerotic lesion. Sinuses/Orbits: The visualized paranasal sinuses and mastoid air cells are clear. Visualized portions of the globes and intraorbital fat are unremarkable. Other: None CT CERVICAL SPINE FINDINGS Alignment: Normal. Skull base and vertebrae: No acute fracture. No primary bone lesion or focal pathologic process. Soft tissues and spinal canal: No prevertebral fluid or swelling. No visible canal hematoma. Disc levels: Loss of disc height noted C5-6 with endplate spurring scattered facet osteoarthritis noted bilaterally. Upper chest: Unremarkable Other: None. IMPRESSION: 1. No acute intracranial abnormality. 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 2. No cervical spine fracture or subluxation. 3.  Degenerative changes in the cervical spine as above. Electronically Signed   By: Misty Stanley M.D.   On: 01/29/2022 10:13   DG Ribs Unilateral W/Chest Left  Result Date: 01/29/2022 CLINICAL DATA:  Pain, fall onto left side EXAM: LEFT RIBS AND CHEST - 3+ VIEW COMPARISON:  Radiograph 10/19/2021 FINDINGS: Unchanged cardiomediastinal silhouette. There is no focal airspace consolidation. There is no pleural effusion or evidence of pneumothorax. There is no acute osseous abnormality. Specifically, no evidence of displaced rib fracture. Thoracic spondylosis. IMPRESSION: No evidence of displaced rib fracture. No acute cardiopulmonary disease. Electronically Signed   By: Ileene Patrick.D.  On: 01/29/2022 09:56   DG Hip Unilat With Pelvis 2-3 Views Left  Result Date: 01/29/2022 CLINICAL DATA:  Pain, fall onto left side EXAM: DG HIP (WITH OR WITHOUT PELVIS) 2-3V LEFT COMPARISON:  None Available. FINDINGS: There is no evidence of acute fracture. Alignment is normal. There is mild left hip osteoarthritis. Vascular calcifications. Unchanged well-circumscribed calcification overlying the left iliac, corresponding to a subcutaneous gluteal soft tissue calcifications seen on prior CT in November 2014. IMPRESSION: No evidence of acute left hip fracture. Mild left hip osteoarthritis. Electronically Signed   By: Maurine Simmering M.D.   On: 01/29/2022 09:53   DG Shoulder Left  Result Date: 01/29/2022 CLINICAL DATA:  Pain, fall onto left side EXAM: LEFT SHOULDER - 2+ VIEW COMPARISON:  None Available. FINDINGS: There is no evidence of acute fracture. Alignment is normal. There is mild glenohumeral and moderate AC joint osteoarthritis. IMPRESSION: No acute fracture or dislocation. Mild glenohumeral and moderate AC joint osteoarthritis. Electronically Signed   By: Maurine Simmering M.D.   On: 01/29/2022 09:49   DG Elbow Complete Left  Result Date: 01/29/2022 CLINICAL DATA:  Pain, fall onto left side EXAM: LEFT ELBOW - COMPLETE 3+  VIEW COMPARISON:  None Available. FINDINGS: There is no evidence of acute fracture. No significant joint effusion. No significant degenerative change. IMPRESSION: Negative left elbow radiographs. Electronically Signed   By: Maurine Simmering M.D.   On: 01/29/2022 09:48     I have personally reviewed multiple x-rays and CT scan above and  I am in agreement with the radiologist interpretation.  ASSESSMENT & PLAN:  1. Dizziness   2. Fall, initial encounter     No orders of the defined types were placed in this encounter.  Discharge instructions  Follow-up with PCP for further evaluation Your x-rays were negative for acute fracture or dislocation CT scan was negative for acute intracranial abnormality Please go to the ED if you have any worsening of your symptoms such as recurrent fall, weakness, dizziness.  Reviewed expectations re: course of current medical issues. Questions answered. Outlined signs and symptoms indicating need for more acute intervention. Patient verbalized understanding. After Visit Summary given.     Emerson Monte, FNP 01/29/22 1514    Emerson Monte, FNP 01/29/22 1517

## 2022-01-29 NOTE — ED Triage Notes (Addendum)
Pt reports falling this morning after getting up multiple times during the night to use the bathroom. States she was dizzy and felt her head spinning, also reports blurry vision. Was seen at [facility] this morning and left due to a 21 hour wait time.  States she is on Plavix.

## 2022-01-29 NOTE — ED Provider Triage Note (Signed)
Emergency Medicine Provider Triage Evaluation Note  Crystal Boyer , a 61 y.o. female  was evaluated in triage.  Pt complains of fall.  Patient reports fall occurred around 2 AM this morning.  She states she got up to use the restroom when she felt acute onset dizzy sensation with subsequent fall.  She states she landed on her left side striking her head on the ground.  Reports possible loss of consciousness and is on Plavix.  Currently complaining of left elbow, left shoulder, left pelvis, left rib and headache.  Reports some feelings of blurry vision from baseline.  Denies shortness of breath, abdominal pain, nausea, vomiting.  Review of Systems  Positive: See above Negative:   Physical Exam  BP (!) 136/90 (BP Location: Right Arm)   Pulse 86   Temp 97.8 F (36.6 C) (Oral)   Resp 19   SpO2 99%  Gen:   Awake, no distress   Resp:  Normal effort  MSK:   Moves extremities without difficulty  Other:  Tender to palpation left shoulder, left elbow, left hip, left ribs.  Medical Decision Making  Medically screening exam initiated at 8:17 AM.  Appropriate orders placed.  Crystal Boyer was informed that the remainder of the evaluation will be completed by another provider, this initial triage assessment does not replace that evaluation, and the importance of remaining in the ED until their evaluation is complete.     Peter Garter, [place] 01/29/22 [phone]

## 2022-01-29 NOTE — ED Notes (Signed)
Patient came up to writer and stated "I am going to be leaving" and walked out of the lobby

## 2022-01-29 NOTE — ED Triage Notes (Signed)
Patient here fell at 2 am trying to walk to the bathroom. Did fall and hit her head. Is on plavix. Patient states she does feel like she lose consciousness briefly but regained it and was able to pick herself up off the floor. Is complaining of left arm pain, and pain to the posterior l side of her head. Aox4.

## 2022-01-29 NOTE — Discharge Instructions (Addendum)
Follow-up with PCP for further evaluation Your x-rays were negative for acute fracture or dislocation CT scan was negative for acute intracranial abnormality Please go to the ED if you have any worsening of your symptoms such as recurrent fall, weakness, dizziness.

## 2022-02-23 ENCOUNTER — Encounter (HOSPITAL_COMMUNITY): Payer: Self-pay | Admitting: Emergency Medicine

## 2022-02-23 ENCOUNTER — Ambulatory Visit (HOSPITAL_COMMUNITY)
Admission: EM | Admit: 2022-02-23 | Discharge: 2022-02-23 | Disposition: A | Discharge status: Home / Self Care | Payer: 59 | Attending: Family Medicine | Admitting: Family Medicine

## 2022-02-23 DIAGNOSIS — E1151 Type 2 diabetes mellitus with diabetic peripheral angiopathy without gangrene: Secondary | ICD-10-CM | POA: Diagnosis not present

## 2022-02-23 DIAGNOSIS — R350 Frequency of micturition: Secondary | ICD-10-CM | POA: Diagnosis not present

## 2022-02-23 DIAGNOSIS — I10 Essential (primary) hypertension: Secondary | ICD-10-CM | POA: Insufficient documentation

## 2022-02-23 DIAGNOSIS — H538 Other visual disturbances: Secondary | ICD-10-CM | POA: Insufficient documentation

## 2022-02-23 DIAGNOSIS — Z794 Long term (current) use of insulin: Secondary | ICD-10-CM | POA: Insufficient documentation

## 2022-02-23 DIAGNOSIS — E0865 Diabetes mellitus due to underlying condition with hyperglycemia: Secondary | ICD-10-CM | POA: Diagnosis present

## 2022-02-23 DIAGNOSIS — E1139 Type 2 diabetes mellitus with other diabetic ophthalmic complication: Secondary | ICD-10-CM | POA: Diagnosis not present

## 2022-02-23 DIAGNOSIS — E1159 Type 2 diabetes mellitus with other circulatory complications: Secondary | ICD-10-CM | POA: Diagnosis not present

## 2022-02-23 DIAGNOSIS — E1165 Type 2 diabetes mellitus with hyperglycemia: Secondary | ICD-10-CM | POA: Insufficient documentation

## 2022-02-23 LAB — POCT URINALYSIS DIPSTICK, ED / UC
Bilirubin Urine: NEGATIVE
Glucose, UA: 100 mg/dL — AB
Ketones, ur: NEGATIVE mg/dL
Nitrite: NEGATIVE
Protein, ur: 100 mg/dL — AB
Specific Gravity, Urine: 1.025 (ref 1.005–1.030)
Urobilinogen, UA: 0.2 mg/dL (ref 0.0–1.0)
pH: 5.5 (ref 5.0–8.0)

## 2022-02-23 LAB — CBG MONITORING, ED: Glucose-Capillary: 229 mg/dL — ABNORMAL HIGH (ref 70–99)

## 2022-02-23 MED ORDER — LOSARTAN POTASSIUM 100 MG PO TABS: 100.0000 mg | ORAL_TABLET | Freq: Every day | ORAL | 1 refills | Status: AC

## 2022-02-23 MED ORDER — AMLODIPINE BESYLATE 5 MG PO TABS: 5.0000 mg | ORAL_TABLET | Freq: Two times a day (BID) | ORAL | 1 refills | Status: DC

## 2022-02-23 NOTE — ED Triage Notes (Signed)
Pt reports that reports seeing spots, having urinary frequency that has been going on for a week. Reports taking Tylenol.  Ran out of HTN medications.  Today was dizzy and thought best to come be seen.

## 2022-02-23 NOTE — Discharge Instructions (Signed)
Your blood pressure was noted to be elevated during your visit today. If you are currently taking medication for high blood pressure, please ensure you are taking this as directed. If you do not have a history of high blood pressure and your blood pressure remains persistently elevated, you may need to begin taking a medication at some point. You may return here within the next few days to recheck if unable to see your primary care provider or if you do not have a one.  BP (!) 170/98 (BP Location: Left Arm)   Pulse 80   Temp 97.8 F (36.6 C)   Resp 17   SpO2 98%   BP Readings from Last 3 Encounters:  02/23/22 (!) 170/98  01/29/22 (!) 162/101  01/29/22 (!) 136/90   Increase your insulin pen to 20 units every evening.

## 2022-02-23 NOTE — ED Provider Notes (Addendum)
[place]   [id number] 02/23/22 Arrival Time: 5361  ASSESSMENT & PLAN:  1. Type 2 diabetes mellitus with hyperglycemia, with long-term current use of insulin ([place])   2. Urinary frequency   3. Blurry vision   4. Elevated blood pressure reading in office with diagnosis of hypertension   5. Controlled type 2 DM with peripheral circulatory disorder ([place])   6. Type 2 diabetes mellitus with other ophthalmic complication, with long-term current use of insulin [facility])      Discharge Instructions      Your blood pressure was noted to be elevated during your visit today. If you are currently taking medication for high blood pressure, please ensure you are taking this as directed. If you do not have a history of high blood pressure and your blood pressure remains persistently elevated, you may need to begin taking a medication at some point. You may return here within the next few days to recheck if unable to see your primary care provider or if you do not have a one.  BP (!) 170/98 (BP Location: Left Arm)   Pulse 80   Temp 97.8 F (36.6 C)   Resp 17   SpO2 98%   BP Readings from Last 3 Encounters:  02/23/22 (!) 170/98  01/29/22 (!) 162/101  01/29/22 (!) 136/90   Increase your insulin pen to 20 units every evening.    Symptoms likely related to elevated blood sugars. Urine culture pending.  Meds ordered this encounter  Medications   losartan (COZAAR) 100 MG tablet    Sig: Take 1 tablet (100 mg total) by mouth daily.    Dispense:  30 tablet    Refill:  1   amLODipine (NORVASC) 5 MG tablet    Sig: Take 1 tablet (5 mg total) by mouth 2 (two) times daily.    Dispense:  60 tablet    Refill:  1    Follow-up Information     Schedule an appointment as soon as possible for a visit  with Group[facility].   Specialty: Internal Medicine Why: For follow up to discuss your blood sugars. Contact information: [address]  [place]  [address] [phone]                 Reviewed expectations re: course of current medical issues. Questions answered. Outlined signs and symptoms indicating need for more acute intervention. Understanding verbalized. After Visit Summary given.   SUBJECTIVE: History from: Patient. Crystal Boyer is a 62 y.o. female. Reports: episodes of blurry vision and urinary frequency; nocturia more than usual; all over the past week or two. Sev weeks ago with vertigo that lasted a few minutes; has not returned. Needs HTN medications refilled. Has trouble taking regularly. Denies: fever and difficulty breathing. Normal PO intake without n/v/d.  OBJECTIVE:  Vitals:   02/23/22 1405  BP: (!) 170/98  Pulse: 80  Resp: 17  Temp: 97.8 F (36.6 C)  SpO2: 98%    General appearance: alert; no distress Eyes: PERRLA; EOMI; conjunctiva normal HENT: [place]; AT; without nasal congestion Neck: supple  Lungs: speaks full sentences without difficulty; unlabored; clear bilat Extremities: no edema Skin: warm and dry Neurologic: normal gait Psychological: alert and cooperative; normal mood and affect  Labs: Results for orders placed or performed during the hospital encounter of 02/23/22  POC Urinalysis dipstick  Result Value Ref Range   Glucose, UA 100 (A) NEGATIVE mg/dL   Bilirubin Urine NEGATIVE NEGATIVE  Ketones, ur NEGATIVE NEGATIVE mg/dL   Specific Gravity, Urine 1.025 1.005 - 1.030   Hgb urine dipstick TRACE (A) NEGATIVE   pH 5.5 5.0 - 8.0   Protein, ur 100 (A) NEGATIVE mg/dL   Urobilinogen, UA 0.2 0.0 - 1.0 mg/dL   Nitrite NEGATIVE NEGATIVE   Leukocytes,Ua TRACE (A) NEGATIVE  POC CBG monitoring  Result Value Ref Range   Glucose-Capillary 229 (H) 70 - 99 mg/dL   Labs Reviewed  POCT URINALYSIS DIPSTICK, ED / UC - Abnormal; Notable for the following components:      Result Value   Glucose, UA 100 (*)    Hgb urine dipstick TRACE (*)    Protein, ur 100 (*)    Leukocytes,Ua TRACE (*)     All other components within normal limits  CBG MONITORING, ED - Abnormal; Notable for the following components:   Glucose-Capillary 229 (*)    All other components within normal limits  URINE CULTURE    Imaging: No results found.  Allergies  Allergen Reactions   Brilinta [Ticagrelor] Shortness Of Breath   Eggs Or Egg-Derived Products Diarrhea   Actos [Pioglitazone] Other (See Comments)    Made the patient feel like she was "going to die"   Glipizide Other (See Comments)    Made patient feel like she was "going to pass out and die"   Tramadol Rash    Past Medical History:  Diagnosis Date   Allergy    Anemia    Asthma    Depression    Diabetes mellitus    Gout 12 days ago   Hypertension    Stroke [facility])    Social History   Socioeconomic History   Marital status: Single    Spouse name: Not on file   Number of children: 3   Years of education: Not on file   Highest education level: Not on file  Occupational History    Comment: preschool teacher  Tobacco Use   Smoking status: Never   Smokeless tobacco: Never  Vaping Use   Vaping Use: Never used  Substance and Sexual Activity   Alcohol use: Yes    Comment: social   Drug use: Not Currently    Types: Marijuana    Comment: 1x monthly   Sexual activity: Yes    Birth control/protection: Condom  Other Topics Concern   Not on file  Social History Narrative   Lives alone   Social Determinants of Health   Financial Resource Strain: Not on file  Food Insecurity: Not on file  Transportation Needs: Not on file  Physical Activity: Not on file  Stress: Not on file  Social Connections: Not on file  Intimate Partner Violence: Not on file   Family History  Problem Relation Age of Onset   Hypertension Mother    Diabetes Mother    Hypertension Father    Diabetes Father    Colon cancer Neg Hx    Past Surgical History:  Procedure Laterality Date   ABDOMINAL HYSTERECTOMY     ANKLE FRACTURE SURGERY     right  ankle   CESAREAN SECTION     IR ANGIO INTRA EXTRACRAN SEL COM CAROTID INNOMINATE BILAT MOD SED  07/07/2020   IR ANGIO VERTEBRAL SEL VERTEBRAL UNI R MOD SED  07/07/2020   IR RADIOLOGIST EVAL & MGMT  06/19/2020   IR RADIOLOGIST EVAL & MGMT  09/22/2020   IR US GUIDE VASC ACCESS RIGHT  07/07/2020     Vanessa Kick, MD 02/23/22 1517  Mardella Layman, MD 03/07/22 [phone]

## 2022-02-25 ENCOUNTER — Telehealth (HOSPITAL_COMMUNITY): Payer: Self-pay | Admitting: Emergency Medicine

## 2022-02-25 LAB — URINE CULTURE: Culture: 40000 — AB

## 2022-02-25 MED ORDER — NITROFURANTOIN MONOHYD MACRO 100 MG PO CAPS: 100.0000 mg | ORAL_CAPSULE | Freq: Two times a day (BID) | ORAL | 0 refills | Status: DC

## 2022-03-10 DIAGNOSIS — E119 Type 2 diabetes mellitus without complications: Secondary | ICD-10-CM | POA: Diagnosis not present

## 2022-03-10 DIAGNOSIS — I10 Essential (primary) hypertension: Secondary | ICD-10-CM | POA: Diagnosis not present

## 2022-03-10 DIAGNOSIS — Z794 Long term (current) use of insulin: Secondary | ICD-10-CM | POA: Diagnosis not present

## 2022-03-10 DIAGNOSIS — N390 Urinary tract infection, site not specified: Secondary | ICD-10-CM | POA: Diagnosis not present

## 2022-03-10 DIAGNOSIS — W57XXXD Bitten or stung by nonvenomous insect and other nonvenomous arthropods, subsequent encounter: Secondary | ICD-10-CM | POA: Diagnosis not present

## 2022-03-31 ENCOUNTER — Ambulatory Visit (HOSPITAL_COMMUNITY)
Admission: EM | Admit: 2022-03-31 | Discharge: 2022-03-31 | Disposition: A | Discharge status: Home / Self Care | Payer: 59 | Attending: Internal Medicine | Admitting: Internal Medicine

## 2022-03-31 ENCOUNTER — Encounter (HOSPITAL_COMMUNITY): Payer: Self-pay | Admitting: Emergency Medicine

## 2022-03-31 DIAGNOSIS — Z7951 Long term (current) use of inhaled steroids: Secondary | ICD-10-CM | POA: Diagnosis not present

## 2022-03-31 DIAGNOSIS — Z79899 Other long term (current) drug therapy: Secondary | ICD-10-CM | POA: Insufficient documentation

## 2022-03-31 DIAGNOSIS — Z8673 Personal history of transient ischemic attack (TIA), and cerebral infarction without residual deficits: Secondary | ICD-10-CM | POA: Diagnosis not present

## 2022-03-31 DIAGNOSIS — Z7982 Long term (current) use of aspirin: Secondary | ICD-10-CM | POA: Insufficient documentation

## 2022-03-31 DIAGNOSIS — Z1152 Encounter for screening for COVID-19: Secondary | ICD-10-CM | POA: Insufficient documentation

## 2022-03-31 DIAGNOSIS — R0602 Shortness of breath: Secondary | ICD-10-CM | POA: Insufficient documentation

## 2022-03-31 DIAGNOSIS — I10 Essential (primary) hypertension: Secondary | ICD-10-CM | POA: Diagnosis not present

## 2022-03-31 DIAGNOSIS — J069 Acute upper respiratory infection, unspecified: Secondary | ICD-10-CM | POA: Insufficient documentation

## 2022-03-31 DIAGNOSIS — Z7984 Long term (current) use of oral hypoglycemic drugs: Secondary | ICD-10-CM | POA: Insufficient documentation

## 2022-03-31 DIAGNOSIS — Z7902 Long term (current) use of antithrombotics/antiplatelets: Secondary | ICD-10-CM | POA: Insufficient documentation

## 2022-03-31 DIAGNOSIS — J45909 Unspecified asthma, uncomplicated: Secondary | ICD-10-CM | POA: Insufficient documentation

## 2022-03-31 DIAGNOSIS — Z794 Long term (current) use of insulin: Secondary | ICD-10-CM | POA: Diagnosis not present

## 2022-03-31 DIAGNOSIS — E119 Type 2 diabetes mellitus without complications: Secondary | ICD-10-CM | POA: Insufficient documentation

## 2022-03-31 DIAGNOSIS — H6591 Unspecified nonsuppurative otitis media, right ear: Secondary | ICD-10-CM | POA: Insufficient documentation

## 2022-03-31 DIAGNOSIS — R058 Other specified cough: Secondary | ICD-10-CM | POA: Insufficient documentation

## 2022-03-31 LAB — SARS CORONAVIRUS 2 (TAT 6-24 HRS): SARS Coronavirus 2: NEGATIVE

## 2022-03-31 MED ORDER — AMOXICILLIN-POT CLAVULANATE 875-125 MG PO TABS: 1.0000 | ORAL_TABLET | Freq: Two times a day (BID) | ORAL | 0 refills | Status: AC

## 2022-03-31 NOTE — ED Triage Notes (Signed)
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 Exposed to [place]. Reports having some congestion and yesterday felt SOB.

## 2022-03-31 NOTE — ED Provider Notes (Signed)
Crystal Boyer    CSN: [id number] Arrival date & time: 03/31/22  0809      History   Chief Complaint Chief Complaint  Patient presents with   Nasal Congestion   Covid Exposure    HPI Crystal Boyer is a 62 y.o. female comes to the urgent care with nasal congestion which started yesterday.  Patient was exposed to a COVID-19 positive patient.  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  Patient started experiencing some shortness of breath yesterday.  She also complains of pain in in the right ear.  No ear discharge or muffled hearing.   HPI  Past Medical History:  Diagnosis Date   Allergy    Anemia    Asthma    Depression    Diabetes mellitus    Gout 12 days ago   Hypertension    Stroke [facility])     Patient Active Problem List   Diagnosis Date Noted   Acute UTI 01/28/2021   Uncontrolled diabetes mellitus with hyperglycemia ([place]) 01/27/2021   AKI (acute kidney injury) ([place]) 01/27/2021   Hives 01/27/2021   Noncompliance with medications 01/27/2021   Acute lower UTI 01/27/2021   History of CVA (cerebrovascular accident) 04/30/2020   Achilles tendon contracture, left 09/25/2017   Posterior tibial tendon dysfunction (PTTD) of right lower extremity 09/25/2017   DIABETES MELLITUS, TYPE II 11/27/2006   Essential hypertension 11/27/2006   MICROALBUMINURIA 11/27/2006    Past Surgical History:  Procedure Laterality Date   ABDOMINAL HYSTERECTOMY     ANKLE FRACTURE SURGERY     right ankle   CESAREAN SECTION     IR ANGIO INTRA EXTRACRAN SEL COM CAROTID INNOMINATE BILAT MOD SED  07/07/2020   IR ANGIO VERTEBRAL SEL VERTEBRAL UNI R MOD SED  07/07/2020   IR RADIOLOGIST EVAL & MGMT  06/19/2020   IR RADIOLOGIST EVAL & MGMT  09/22/2020   IR US GUIDE VASC ACCESS RIGHT  07/07/2020    OB History   No obstetric history on file.      Home Medications    Prior to Admission medications   Medication Sig Start Date End Date  Taking? Authorizing Provider  amLODipine (NORVASC) 5 MG tablet Take 1 tablet (5 mg total) by mouth 2 (two) times daily. 02/23/22   Vanessa Kick, MD  amoxicillin-clavulanate (AUGMENTIN) 875-125 MG tablet Take 1 tablet by mouth every 12 (twelve) hours for 5 days. 03/31/22 04/05/22 Yes Shawntae Lowy, Myrene Galas, MD  aspirin EC 81 MG EC tablet Take 1 tablet (81 mg total) by mouth daily. Swallow whole. Patient taking differently: Take 81 mg by mouth daily. 05/02/20   Marianna Payment, MD  atorvastatin (LIPITOR) 80 MG tablet Take 1 tablet (80 mg total) by mouth daily. Patient taking differently: Take 80 mg by mouth at bedtime. 09/02/20 10/19/21  Frann Rider, NP  blood glucose meter kit and supplies KIT Dispense based on patient and insurance preference. Use up to four times daily as directed. 01/29/21   Mercy Riding, MD  clopidogrel (PLAVIX) 75 MG tablet Take 75 mg by mouth daily.    [provider]  gabapentin (NEURONTIN) 100 MG capsule Take 100 mg by mouth 2 (two) times daily.    [provider]  glipiZIDE (GLUCOTROL XL) 2.5 MG 24 hr tablet Take 1 tablet by mouth daily with breakfast. 02/02/22   [provider]  insulin aspart (NOVOLOG) 100 UNIT/ML FlexPen Inject 5 Units into the skin 3 (  three) times daily with meals. Patient not taking: Reported on 10/19/2021 05/29/21   Domenic Moras, PA-C  LANTUS SOLOSTAR 100 UNIT/ML Solostar Pen Inject 30 Units into the skin daily. 05/29/21   Domenic Moras, PA-C  losartan (COZAAR) 100 MG tablet Take 1 tablet (100 mg total) by mouth daily. 02/23/22   Vanessa Kick, MD  albuterol (PROVENTIL HFA;VENTOLIN HFA) 108 (90 Base) MCG/ACT inhaler Inhale 2 puffs into the lungs every 6 (six) hours as needed for wheezing or shortness of breath. Patient not taking: Reported on 07/03/2018 04/24/18 01/13/19  Harrie Foreman, MD    Family History Family History  Problem Relation Age of Onset   Hypertension Mother    Diabetes Mother    Hypertension Father    Diabetes  Father    Colon cancer Neg Hx     Social History Social History   Tobacco Use   Smoking status: Never   Smokeless tobacco: Never  Vaping Use   Vaping Use: Never used  Substance Use Topics   Alcohol use: Yes    Comment: social   Drug use: Not Currently    Types: Marijuana    Comment: 1x monthly     Allergies   Brilinta [ticagrelor], Eggs or egg-derived products, Actos [pioglitazone], Glipizide, and Tramadol   Review of Systems Review of Systems As per HPI  Physical Exam Triage Vital Signs ED Triage Vitals  Enc Vitals Group     BP 03/31/22 0849 (!) 147/93     Pulse Rate 03/31/22 0849 76     Resp 03/31/22 0849 17     Temp 03/31/22 0849 97.9 F (36.6 C)     Temp Source 03/31/22 0849 Oral     SpO2 03/31/22 0849 97 %     Weight --      Height --      Head Circumference --      Peak Flow --      Pain Score 03/31/22 0847 7     Pain Loc --      Pain Edu? --      Excl. in [place]? --    No data found.  Updated Vital Signs BP (!) 147/93 (BP Location: Left Arm)   Pulse 76   Temp 97.9 F (36.6 C) (Oral)   Resp 17   SpO2 97%   Visual Acuity Right Eye Distance:   Left Eye Distance:   Bilateral Distance:    Right Eye Near:   Left Eye Near:    Bilateral Near:     Physical Exam Vitals and nursing note reviewed.  Constitutional:      General: She is not in acute distress.    Appearance: She is not ill-appearing.  HENT:     Left Ear: Tympanic membrane normal.     Ears:     Comments: Right tympanic membrane erythema    Mouth/Throat:     Mouth: Mucous membranes are moist.     Pharynx: No posterior oropharyngeal erythema.  Cardiovascular:     Rate and Rhythm: Normal rate and regular rhythm.     Pulses: Normal pulses.     Heart sounds: Normal heart sounds.  Pulmonary:     Effort: Pulmonary effort is normal.     Breath sounds: Normal breath sounds.  Musculoskeletal:        General: Normal range of motion.  Neurological:     Mental Status: She is alert.       UC Treatments / Results  Labs (all labs ordered  are listed, but only abnormal results are displayed) Labs Reviewed  SARS CORONAVIRUS 2 (TAT 6-24 HRS)    EKG   Radiology No results found.  Procedures Procedures (including critical care time)  Medications Ordered in UC Medications - No data to display  Initial Impression / Assessment and Plan / UC Course  I have reviewed the triage vital signs and the nursing notes.  Pertinent labs & imaging results that were available during my care of the patient were reviewed by me and considered in my medical decision making (see chart for details).     1.  Viral URI with cough: This is likely COVID-19 infection COVID-19 PCR test has been sent Patient advised to maintain adequate hydration Currently patient denies significant cough Ibuprofen as needed for generalized body aches or headaches Will call patient with recommendations if labs are abnormal  2.  Right otitis media with middle ear effusion: Augmentin twice daily for 5 days Return precautions given Discontinue nitrofurantoin-this is being used to treat Enterococcus faecalis UTI.  Medical records reviewed-Enterococcus faecalis is sensitive to ampicillin Final Clinical Impressions(s) / UC Diagnoses   Final diagnoses:  Viral URI with cough  Right otitis media with effusion     Discharge Instructions      Please maintain adequate hydration Will call you with recommendations if labs are abnormal You have a left ear infection   ED Prescriptions     Medication Sig Dispense Auth. Provider   amoxicillin-clavulanate (AUGMENTIN) 875-125 MG tablet Take 1 tablet by mouth every 12 (twelve) hours for 5 days. 10 tablet Natalin Bible, Myrene Galas, MD      PDMP not reviewed this encounter.   Chase Picket, MD 03/31/22 1229

## 2022-03-31 NOTE — Discharge Instructions (Addendum)
Please maintain adequate hydration Will call you with recommendations if labs are abnormal You have a left ear infection

## 2022-04-13 DIAGNOSIS — E119 Type 2 diabetes mellitus without complications: Secondary | ICD-10-CM | POA: Diagnosis not present

## 2022-04-13 DIAGNOSIS — Z794 Long term (current) use of insulin: Secondary | ICD-10-CM | POA: Diagnosis not present

## 2022-04-13 DIAGNOSIS — I10 Essential (primary) hypertension: Secondary | ICD-10-CM | POA: Diagnosis not present

## 2022-04-13 DIAGNOSIS — Z23 Encounter for immunization: Secondary | ICD-10-CM | POA: Diagnosis not present

## 2022-04-19 DIAGNOSIS — Z111 Encounter for screening for respiratory tuberculosis: Secondary | ICD-10-CM | POA: Diagnosis not present

## 2022-04-21 DIAGNOSIS — Z111 Encounter for screening for respiratory tuberculosis: Secondary | ICD-10-CM | POA: Diagnosis not present

## 2022-05-25 ENCOUNTER — Ambulatory Visit (HOSPITAL_COMMUNITY)
Admission: EM | Admit: 2022-05-25 | Discharge: 2022-05-25 | Disposition: A | Discharge status: Home / Self Care | Payer: 59 | Attending: Internal Medicine | Admitting: Internal Medicine

## 2022-05-25 ENCOUNTER — Encounter (HOSPITAL_COMMUNITY): Payer: Self-pay

## 2022-05-25 DIAGNOSIS — S3991XA Unspecified injury of abdomen, initial encounter: Secondary | ICD-10-CM | POA: Diagnosis not present

## 2022-05-25 DIAGNOSIS — W57XXXA Bitten or stung by nonvenomous insect and other nonvenomous arthropods, initial encounter: Secondary | ICD-10-CM

## 2022-05-25 MED ORDER — TRIAMCINOLONE ACETONIDE 0.1 % EX CREA: 1.0000 | TOPICAL_CREAM | Freq: Two times a day (BID) | CUTANEOUS | 0 refills | Status: AC
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
## 2022-05-25 NOTE — Discharge Instructions (Addendum)
Use triamcinolone steroid cream to the bedbug bites twice daily for the next 7 to 10 days.  Do not use this for longer than 7 to 10 days as it can cause skin thinning.  Use Tylenol or ibuprofen as needed for abdominal discomfort related to recent injury. You may also apply heat to the abdomen to reduce muscle spasm 20 minutes on 20 minutes off as needed.  If you develop any new or worsening symptoms or do not improve in the next 2 to 3 days, please return.  If your symptoms are severe, please go to the emergency room.  Follow-up with your primary care provider for further evaluation and management of your symptoms as well as ongoing wellness visits.  I hope you feel better!

## 2022-06-25 ENCOUNTER — Emergency Department (HOSPITAL_COMMUNITY)
Admission: EM | Admit: 2022-06-25 | Discharge: 2022-06-25 | Disposition: A | Discharge status: Home / Self Care | Payer: 59 | Attending: Emergency Medicine | Admitting: Emergency Medicine

## 2022-06-25 ENCOUNTER — Encounter (HOSPITAL_COMMUNITY): Payer: Self-pay

## 2022-06-25 ENCOUNTER — Other Ambulatory Visit: Payer: Self-pay

## 2022-06-25 DIAGNOSIS — Z7984 Long term (current) use of oral hypoglycemic drugs: Secondary | ICD-10-CM | POA: Insufficient documentation

## 2022-06-25 DIAGNOSIS — Z7982 Long term (current) use of aspirin: Secondary | ICD-10-CM | POA: Insufficient documentation

## 2022-06-25 DIAGNOSIS — R030 Elevated blood-pressure reading, without diagnosis of hypertension: Secondary | ICD-10-CM | POA: Insufficient documentation

## 2022-06-25 DIAGNOSIS — Z79899 Other long term (current) drug therapy: Secondary | ICD-10-CM | POA: Insufficient documentation

## 2022-06-25 DIAGNOSIS — I10 Essential (primary) hypertension: Secondary | ICD-10-CM | POA: Insufficient documentation

## 2022-06-25 DIAGNOSIS — R739 Hyperglycemia, unspecified: Secondary | ICD-10-CM | POA: Diagnosis not present

## 2022-06-25 DIAGNOSIS — Z794 Long term (current) use of insulin: Secondary | ICD-10-CM | POA: Diagnosis not present

## 2022-06-25 DIAGNOSIS — E1165 Type 2 diabetes mellitus with hyperglycemia: Secondary | ICD-10-CM | POA: Diagnosis not present

## 2022-06-25 LAB — URINALYSIS, ROUTINE W REFLEX MICROSCOPIC
Bacteria, UA: NONE SEEN
Bilirubin Urine: NEGATIVE
Glucose, UA: 150 mg/dL — AB
Hgb urine dipstick: NEGATIVE
Ketones, ur: NEGATIVE mg/dL
Nitrite: NEGATIVE
Protein, ur: 30 mg/dL — AB
Specific Gravity, Urine: 1.017 (ref 1.005–1.030)
pH: 5 (ref 5.0–8.0)

## 2022-06-25 LAB — BASIC METABOLIC PANEL
Anion gap: 8 (ref 5–15)
BUN: 14 mg/dL (ref 6–20)
CO2: 24 mmol/L (ref 22–32)
Calcium: 9 mg/dL (ref 8.9–10.3)
Chloride: 102 mmol/L (ref 98–111)
Creatinine, Ser: 1.05 mg/dL — ABNORMAL HIGH (ref 0.44–1.00)
GFR, Estimated: >60 mL/min (ref >60)
Glucose, Bld: 350 mg/dL — ABNORMAL HIGH (ref 70–99)
Potassium: 5 mmol/L (ref 3.5–5.1)
Sodium: 134 mmol/L — ABNORMAL LOW (ref 135–145)

## 2022-06-25 LAB — CBC
HCT: 37.4 % (ref 36.0–46.0)
Hemoglobin: 12.1 g/dL (ref 12.0–15.0)
MCH: 27.8 pg (ref 26.0–34.0)
MCHC: 32.4 g/dL (ref 30.0–36.0)
MCV: 86 fL (ref 80.0–100.0)
Platelets: 234 10*3/uL (ref 150–400)
RBC: 4.35 MIL/uL (ref 3.87–5.11)
RDW: 13.7 % (ref 11.5–15.5)
WBC: 5.3 10*3/uL (ref 4.0–10.5)
nRBC: 0 % (ref 0.0–0.2)

## 2022-06-25 LAB — CBG MONITORING, ED
Glucose-Capillary: 328 mg/dL — ABNORMAL HIGH (ref 70–99)
Glucose-Capillary: 133 mg/dL — ABNORMAL HIGH (ref 70–99)

## 2022-06-25 MED ORDER — SODIUM CHLORIDE 0.9 % IV BOLUS
500.0000 mL | Freq: Once | INTRAVENOUS | Status: AC
  Administered 2022-06-25: 500 mL via INTRAVENOUS

## 2022-06-25 MED ORDER — AMLODIPINE BESYLATE 5 MG PO TABS
5.0000 mg | ORAL_TABLET | Freq: Once | ORAL | Status: AC
  Administered 2022-06-25: 5 mg via ORAL
  Filled 2022-06-25: qty 1

## 2022-06-25 MED ORDER — INSULIN ASPART 100 UNIT/ML IJ SOLN: 10.0000 [IU] | Freq: Once | INTRAMUSCULAR | Status: DC

## 2022-06-25 MED ORDER — LANTUS SOLOSTAR 100 UNIT/ML ~~LOC~~ SOPN: 30.0000 [IU] | PEN_INJECTOR | Freq: Every day | SUBCUTANEOUS | 1 refills | Status: DC

## 2022-06-25 MED ORDER — AMLODIPINE BESYLATE 5 MG PO TABS: 5.0000 mg | ORAL_TABLET | Freq: Two times a day (BID) | ORAL | 1 refills | Status: AC

## 2022-06-25 MED ORDER — INSULIN ASPART 100 UNIT/ML FLEXPEN: 5.0000 [IU] | PEN_INJECTOR | Freq: Three times a day (TID) | SUBCUTANEOUS | 1 refills | Status: DC

## 2022-06-25 MED ORDER — INSULIN ASPART 100 UNIT/ML IJ SOLN
10.0000 [IU] | Freq: Once | INTRAMUSCULAR | Status: AC
  Administered 2022-06-25: 10 [IU] via INTRAVENOUS

## 2022-06-25 NOTE — ED Provider Notes (Signed)
[place] EMERGENCY DEPARTMENT AT [facility] Provider Note   CSN: [id number] Arrival date & time: 06/25/22  4098     History  Chief Complaint  Patient presents with   Hypertension   Hyperglycemia    Crystal Boyer is a 62 y.o. female.  Patient with history of hypertension, diabetes presents today with complaints of hyperglycemia. She states that she has been checking her readings at home and they have been consistently in the 300s. She is normally on insulin but ran out recently. She called her doctor for refills and was told that she could not get any refills until she scheduled an appointment. She does not currently have an appointment scheduled. She endorses feeling generally poor and has excess thirst as well as nausea without vomiting. She is also out of her blood pressure medication. She denies headaches, vision changes, chest pain, shortness of breath, or abdominal pain.   The history is provided by the patient. No language interpreter was used.  Hypertension  Hyperglycemia Associated symptoms: increased thirst        Home Medications Prior to Admission medications   Medication Sig Start Date End Date Taking? Authorizing Provider  amLODipine (NORVASC) 5 MG tablet Take 1 tablet (5 mg total) by mouth 2 (two) times daily. 02/23/22   Mardella Layman, MD  aspirin EC 81 MG EC tablet Take 1 tablet (81 mg total) by mouth daily. Swallow whole. Patient taking differently: Take 81 mg by mouth daily. 05/02/20   Dellia Cloud, MD  atorvastatin (LIPITOR) 80 MG tablet Take 1 tablet (80 mg total) by mouth daily. Patient taking differently: Take 80 mg by mouth at bedtime. 09/02/20 10/19/21  Ihor Austin, NP  blood glucose meter kit and supplies KIT Dispense based on patient and insurance preference. Use up to four times daily as directed. 01/29/21   Almon Hercules, MD  clopidogrel (PLAVIX) 75 MG tablet Take 75 mg by mouth daily.    [provider]  gabapentin  (NEURONTIN) 100 MG capsule Take 100 mg by mouth 2 (two) times daily.    [provider]  glipiZIDE (GLUCOTROL XL) 2.5 MG 24 hr tablet Take 1 tablet by mouth daily with breakfast. 02/02/22   [provider]  insulin aspart (NOVOLOG) 100 UNIT/ML FlexPen Inject 5 Units into the skin 3 (three) times daily with meals. Patient not taking: Reported on 10/19/2021 05/29/21   Fayrene Helper, PA-C  LANTUS SOLOSTAR 100 UNIT/ML Solostar Pen Inject 30 Units into the skin daily. 05/29/21   Fayrene Helper, PA-C  losartan (COZAAR) 100 MG tablet Take 1 tablet (100 mg total) by mouth daily. 02/23/22   Mardella Layman, MD  triamcinolone cream (KENALOG) 0.1 % Apply 1 Application topically 2 (two) times daily. 05/25/22   Carlisle Beers, FNP  albuterol (PROVENTIL HFA;VENTOLIN HFA) 108 (90 Base) MCG/ACT inhaler Inhale 2 puffs into the lungs every 6 (six) hours as needed for wheezing or shortness of breath. Patient not taking: Reported on 07/03/2018 04/24/18 01/13/19  Arnaldo Natal, MD      Allergies    Brilinta [ticagrelor], Egg-derived products, Actos [pioglitazone], Glipizide, and Tramadol    Review of Systems   Review of Systems  Endocrine: Positive for polydipsia.  All other systems reviewed and are negative.   Physical Exam Updated Vital Signs BP (!) 153/89   Pulse 85   Temp 97.6 F (36.4 C)   Resp 18   Ht 5' 6.5" (1.689 m)   Wt 108.9 kg   SpO2  100%   BMI 38.17 kg/m  Physical Exam Vitals and nursing note reviewed.  Constitutional:      General: She is not in acute distress.    Appearance: Normal appearance. She is normal weight. She is not ill-appearing, toxic-appearing or diaphoretic.  HENT:     Head: Normocephalic and atraumatic.  Cardiovascular:     Rate and Rhythm: Normal rate and regular rhythm.     Heart sounds: Normal heart sounds.  Pulmonary:     Effort: Pulmonary effort is normal. No respiratory distress.     Breath sounds: Normal breath sounds.  Abdominal:      General: Abdomen is flat.     Palpations: Abdomen is soft.     Tenderness: There is no abdominal tenderness.  Musculoskeletal:        General: Normal range of motion.     Cervical back: Normal range of motion.  Skin:    General: Skin is warm and dry.  Neurological:     General: No focal deficit present.     Mental Status: She is alert.  Psychiatric:        Mood and Affect: Mood normal.        Behavior: Behavior normal.     ED Results / Procedures / Treatments   Labs (all labs ordered are listed, but only abnormal results are displayed) Labs Reviewed  BASIC METABOLIC PANEL - Abnormal; Notable for the following components:      Result Value   Sodium 134 (*)    Glucose, Bld 350 (*)    Creatinine, Ser 1.05 (*)    All other components within normal limits  CBG MONITORING, ED - Abnormal; Notable for the following components:   Glucose-Capillary 328 (*)    All other components within normal limits  CBC  URINALYSIS, ROUTINE W REFLEX MICROSCOPIC    EKG None  Radiology No results found.  Procedures Procedures    Medications Ordered in ED Medications  insulin aspart (novoLOG) injection 10 Units (has no administration in time range)  amLODipine (NORVASC) tablet 5 mg (has no administration in time range)  sodium chloride 0.9 % bolus 500 mL (500 mLs Intravenous New Bag/Given 06/25/22 1610)    ED Course/ Medical Decision Making/ A&P                             Medical Decision Making Amount and/or Complexity of Data Reviewed Labs: ordered.   This patient is a 62 y.o. female who presents to the ED for concern of hypertension, hyperglycemia, this involves an extensive number of treatment options, and is a complaint that carries with it a high risk of complications and morbidity. The emergent differential diagnosis prior to evaluation includes, but is not limited to,  hypertensive urgency/emergency, DKA/HHS. This is not an exhaustive differential.   Past Medical History /  Co-morbidities / Social History: history of hypertension, diabetes  Physical Exam: Physical exam performed. The pertinent findings include: Per above, no pertinent physical exam findings  Lab Tests: I ordered, and personally interpreted labs.  The pertinent results include:  Na 134, glucose 350 --> 133, creatinine 1.05 consistent with baseline.   Medications: I ordered medication including fluids, insulin, amlodipine  for dehydration, hyperglycemia, hypertension. Reevaluation of the patient after these medicines showed that the patient resolved. I have reviewed the patients home medicines and have made adjustments as needed.    Disposition: After consideration of the diagnostic results and the patients response  to treatment, I feel that emergency department workup does not suggest an emergent condition requiring admission or immediate intervention beyond what has been performed at this time. The plan is: Discharge with close outpatient follow-up.  After fluids, insulin, and amlodipine, patient's symptoms have completely resolved and she is feeling completely improved and ready to go home.  Will refill patient's medications.  Emphasized importance of close PCP follow-up and medication adherence. Evaluation and diagnostic testing in the emergency department does not suggest an emergent condition requiring admission or immediate intervention beyond what has been performed at this time.  Plan for discharge with close PCP follow-up.  Patient is understanding and amenable with plan, educated on red flag symptoms that would prompt immediate return.  Patient discharged in stable condition.   Final Clinical Impression(s) / ED Diagnoses Final diagnoses:  Elevated blood pressure reading  Hyperglycemia    Rx / DC Orders ED Discharge Orders          Ordered    amLODipine (NORVASC) 5 MG tablet  2 times daily        06/25/22 1246    insulin aspart (NOVOLOG) 100 UNIT/ML FlexPen  3 times daily with meals         06/25/22 1246    LANTUS SOLOSTAR 100 UNIT/ML Solostar Pen  Daily        06/25/22 1246          An After Visit Summary was printed and given to the patient.     Vear Clock 06/25/22 1247    Pricilla Loveless, MD 06/26/22 1257

## 2022-06-25 NOTE — ED Triage Notes (Signed)
Pt states glucose was 400 this morning. Pt states she has had increased thirst for the past 3 days. Pt states she ran out of insulin for a week and PCP won't give her more until she is seen by them. Pt states bp has been elevated, bp was 153/85 yesterday. Pt states she felt like she was going to pass out yesterday

## 2022-06-25 NOTE — ED Notes (Signed)
RN sent down repeat CBC per lab request

## 2022-06-25 NOTE — Discharge Instructions (Signed)
As we discussed, your workup in the ER today was reassuring for acute findings.  Laboratory evaluation did not reveal any emergent concerns.  I have refilled your medications and sent them to the pharmacy listed below.  Is very important you take these medications as prescribed every day and check your blood pressure and your blood sugar readings regularly.  Please record these findings and bring them to your primary care appointment.  Please call and schedule an appointment with your primary doctor on Monday.  Return if development of any new or worsening symptoms.

## 2022-08-11 ENCOUNTER — Ambulatory Visit (HOSPITAL_COMMUNITY)
Admission: EM | Admit: 2022-08-11 | Discharge: 2022-08-11 | Disposition: A | Discharge status: Home / Self Care | Payer: Medicaid Other | Attending: Emergency Medicine | Admitting: Emergency Medicine

## 2022-08-11 ENCOUNTER — Encounter (HOSPITAL_COMMUNITY): Payer: Self-pay

## 2022-08-11 DIAGNOSIS — E119 Type 2 diabetes mellitus without complications: Secondary | ICD-10-CM

## 2022-08-11 DIAGNOSIS — B1710 Acute hepatitis C without hepatic coma: Secondary | ICD-10-CM

## 2022-08-11 DIAGNOSIS — Z794 Long term (current) use of insulin: Secondary | ICD-10-CM

## 2022-08-11 HISTORY — DX: Unspecified viral hepatitis C without hepatic coma: B19.20

## 2022-08-11 LAB — POCT FASTING CBG KUC MANUAL ENTRY: POCT Glucose (KUC): 137 mg/dL — AB (ref 70–99)

## 2022-08-11 MED ORDER — NOVOLOG FLEXPEN 100 UNIT/ML ~~LOC~~ SOPN: PEN_INJECTOR | SUBCUTANEOUS | 0 refills | Status: AC

## 2022-08-11 MED ORDER — INSULIN GLARGINE 100 UNIT/ML ~~LOC~~ SOLN: 35.0000 [IU] | Freq: Every day | SUBCUTANEOUS | 0 refills | Status: AC

## 2022-08-11 NOTE — Discharge Instructions (Addendum)
Please continue injecting Lantus (insulin glargine) every day.  I recommend that you increase your dose to 35 units daily.  I have sent a new prescription for Lantus to your pharmacy.  When your blood sugar is greater than 150 I would like you to give yourself 1 unit of NovoLog (insulin aspart) for each increment of 50 above 150.  For example:   If your blood sugar is 200, give yourself 1 unit of NovoLog.  200-150 = 50.  50 / 50 = 1 unit.  If your blood sugar is 300, give yourself 3 units of NovoLog.  300-150 = 150.  150 / 50 = 3 units.  If your blood sugar is 450, give yourself 6 units of NovoLog.  450-150 = 300.  300 / 50 = 6 units.  I have sent a prescription for NovoLog to your pharmacy.  Please reach out to the infectious disease and liver doctors that have included in your discharge summary today to see if you can get an appointment sooner than September 19.  You are also welcome to reach out to the hepatologist with whom you are currently scheduled to see if they can put you on a cancellation list.  Thank you for visiting [facility] today.

## 2022-08-11 NOTE — ED Provider Notes (Signed)
[facility]    CSN: [id number] Arrival date & time: 08/11/22  4098    HISTORY   Chief Complaint  Patient presents with   Fatigue   Hyperglycemia   Dizziness   HPI Crystal Boyer is a pleasant, 62 y.o. female who presents to urgent care today. Patient complains of weakness, fatigue, muscle stiffness, dizziness, blood sugars as high as 500, swelling in her hands and feet.  Patient states her fasting blood sugar upon waking was 200.  Patient had a blood sugar of 137 here in the clinic.  Patient states she has been taking Lantus daily along with glipizide and atorvastatin.  Patient states she has been referred to a hepatologist for hepatitis C, states she tested positive 2 weeks ago, but her appointment is not until 09/22/2022.  Upon review of her EMR, her appointment actually is October 27, 2022, her viral load is 886,000 and the genotype is 1A.  The history is provided by the patient.   Past Medical History:  Diagnosis Date   Allergy    Anemia    Asthma    Depression    Diabetes mellitus    Gout 12 days ago   Hepatitis C    Hypertension    Stroke [facility])    Patient Active Problem List   Diagnosis Date Noted   Acute UTI 01/28/2021   Uncontrolled diabetes mellitus with hyperglycemia (HCC) 01/27/2021   AKI (acute kidney injury) (HCC) 01/27/2021   Hives 01/27/2021   Noncompliance with medications 01/27/2021   Acute lower UTI 01/27/2021   History of CVA (cerebrovascular accident) 04/30/2020   Achilles tendon contracture, left 09/25/2017   Posterior tibial tendon dysfunction (PTTD) of right lower extremity 09/25/2017   DIABETES MELLITUS, TYPE II 11/27/2006   Essential hypertension 11/27/2006   MICROALBUMINURIA 11/27/2006   Past Surgical History:  Procedure Laterality Date   ABDOMINAL HYSTERECTOMY     ANKLE FRACTURE SURGERY     right ankle   CESAREAN SECTION     IR ANGIO INTRA EXTRACRAN SEL COM CAROTID INNOMINATE BILAT MOD SED  07/07/2020   IR ANGIO  VERTEBRAL SEL VERTEBRAL UNI R MOD SED  07/07/2020   IR RADIOLOGIST EVAL & MGMT  06/19/2020   IR RADIOLOGIST EVAL & MGMT  09/22/2020   IR US GUIDE VASC ACCESS RIGHT  07/07/2020   OB History   No obstetric history on file.    Home Medications    Prior to Admission medications   Medication Sig Start Date End Date Taking? Authorizing Provider  amLODipine (NORVASC) 5 MG tablet Take 1 tablet (5 mg total) by mouth 2 (two) times daily. 06/25/22  Yes Smoot, Sarah A, PA-C  atorvastatin (LIPITOR) 80 MG tablet Take 1 tablet (80 mg total) by mouth daily. Patient taking differently: Take 80 mg by mouth at bedtime. 09/02/20 08/11/22 Yes McCue, Shanda Bumps, NP  gabapentin (NEURONTIN) 100 MG capsule Take 100 mg by mouth 2 (two) times daily.   Yes [provider]  glipiZIDE (GLUCOTROL XL) 2.5 MG 24 hr tablet Take 1 tablet by mouth daily with breakfast. 02/02/22  Yes [provider]  LANTUS SOLOSTAR 100 UNIT/ML Solostar Pen Inject 30 Units into the skin daily. 06/25/22  Yes Smoot, Shawn Route, PA-C  aspirin EC 81 MG EC tablet Take 1 tablet (81 mg total) by mouth daily. Swallow whole. Patient taking differently: Take 81 mg by mouth daily. 05/02/20   Dellia Cloud, MD  blood glucose meter kit and supplies KIT Dispense based on patient and  insurance preference. Use up to four times daily as directed. 01/29/21   Almon Hercules, MD  clopidogrel (PLAVIX) 75 MG tablet Take 75 mg by mouth daily.    [provider]  insulin aspart (NOVOLOG) 100 UNIT/ML FlexPen Inject 5 Units into the skin 3 (three) times daily with meals. 06/25/22   Smoot, Shawn Route, PA-C  losartan (COZAAR) 100 MG tablet Take 1 tablet (100 mg total) by mouth daily. 02/23/22   Mardella Layman, MD  triamcinolone cream (KENALOG) 0.1 % Apply 1 Application topically 2 (two) times daily. 05/25/22   Carlisle Beers, FNP  albuterol (PROVENTIL HFA;VENTOLIN HFA) 108 (90 Base) MCG/ACT inhaler Inhale 2 puffs into the lungs every 6 (six) hours as needed for  wheezing or shortness of breath. Patient not taking: Reported on 07/03/2018 04/24/18 01/13/19  Arnaldo Natal, MD    Family History Family History  Problem Relation Age of Onset   Hypertension Mother    Diabetes Mother    Hypertension Father    Diabetes Father    Colon cancer Neg Hx    Social History Social History   Tobacco Use   Smoking status: Never   Smokeless tobacco: Never  Vaping Use   Vaping Use: Never used  Substance Use Topics   Alcohol use: Yes    Comment: social   Drug use: Not Currently    Types: Marijuana    Comment: 1x monthly   Allergies   Brilinta [ticagrelor], Egg-derived products, Actos [pioglitazone], Glipizide, and Tramadol  Review of Systems Review of Systems Pertinent findings revealed after performing a 14 point review of systems has been noted in the history of present illness.  Physical Exam Vital Signs BP 129/84 (BP Location: Left Arm)   Pulse 78   Temp (!) 97.3 F (36.3 C) (Oral)   Resp 18   Ht 5' 6.5" (1.689 m)   Wt 230 lb (104.3 kg)   SpO2 97%   BMI 36.57 kg/m   No data found.  Physical Exam Vitals and nursing note reviewed.  Constitutional:      General: She is awake. She is not in acute distress.    Appearance: Normal appearance. She is well-developed, well-groomed and overweight. She is not ill-appearing, toxic-appearing or diaphoretic.  HENT:     Head: Normocephalic and atraumatic.  Eyes:     Pupils: Pupils are equal, round, and reactive to light.  Cardiovascular:     Rate and Rhythm: Normal rate and regular rhythm.  Pulmonary:     Effort: Pulmonary effort is normal.     Breath sounds: Normal breath sounds.  Musculoskeletal:        General: Normal range of motion.     Cervical back: Normal range of motion and neck supple.  Skin:    General: Skin is warm.  Neurological:     General: No focal deficit present.     Mental Status: She is alert and oriented to person, place, and time. Mental status is at baseline.   Psychiatric:        Mood and Affect: Mood normal.        Behavior: Behavior normal. Behavior is cooperative.        Thought Content: Thought content normal.        Judgment: Judgment normal.     Visual Acuity Right Eye Distance:   Left Eye Distance:   Bilateral Distance:    Right Eye Near:   Left Eye Near:    Bilateral Near:  UC Couse / Diagnostics / Procedures:     Radiology No results found.  Procedures Procedures (including critical care time) EKG  Pending results:  Labs Reviewed  POCT FASTING CBG [facility] MANUAL ENTRY - Abnormal; Notable for the following components:      Result Value   POCT Glucose ([facility]) 137 (*)    All other components within normal limits    Medications Ordered in UC: Medications - No data to display  UC Diagnoses / Final Clinical Impressions(s)   I have reviewed the triage vital signs and the nursing notes.  Pertinent labs & imaging results that were available during my care of the patient were reviewed by me and considered in my medical decision making (see chart for details).    Final diagnoses:  Acute hepatitis C virus infection without hepatic coma  Type 2 diabetes mellitus without complication, with long-term current use of insulin [facility])   Physical exam today is unremarkable.  Patient provided with contact information for several other clinics for her hepatitis C evaluation and treatment, patient also encouraged to reach out to her current hepatologist to see if she can get on a cancellation schedule so she can be seen sooner given her significantly elevated viral load.  Patient given information regarding low-carb, low sugar diet.  Patient advised to increase Lantus to 35 units /day.  Patient also provided with sliding scale insulin for blood sugar highs with meals and before bedtime.  Patient has an appointment with primary care in 5 days, encouraged to discuss getting a Dexcom monitor with PCP.  Conservative care recommended.  Return  precautions advised.  Emergency precautions advised.  Please see discharge instructions below for details of plan of care as provided to patient. ED Prescriptions     Medication Sig Dispense Auth. Provider   insulin aspart (NOVOLOG FLEXPEN) 100 UNIT/ML FlexPen With meals and before bedtime, inject 1 unit for every 50 mg/DL increment greater than 150 mg/DL.  Maximum dose 7 units 4 times daily WC, 28 units/day. 3 mL Theadora Rama Scales, PA-C   insulin glargine (LANTUS) 100 UNIT/ML injection Inject 0.35 mLs (35 Units total) into the skin daily. 12 mL Theadora Rama Scales, PA-C      PDMP not reviewed this encounter.  Pending results:  Labs Reviewed  POCT FASTING CBG [facility] MANUAL ENTRY - Abnormal; Notable for the following components:      Result Value   POCT Glucose ([facility]) 137 (*)    All other components within normal limits    Discharge Instructions:   Discharge Instructions      Please continue injecting Lantus (insulin glargine) every day.  I recommend that you increase your dose to 35 units daily.  I have sent a new prescription for Lantus to your pharmacy.  When your blood sugar is greater than 150 I would like you to give yourself 1 unit of NovoLog (insulin aspart) for each increment of 50 above 150.  For example:   If your blood sugar is 200, give yourself 1 unit of NovoLog.  200-150 = 50.  50 / 50 = 1 unit.  If your blood sugar is 300, give yourself 3 units of NovoLog.  300-150 = 150.  150 / 50 = 3 units.  If your blood sugar is 450, give yourself 6 units of NovoLog.  450-150 = 300.  300 / 50 = 6 units.  I have sent a prescription for NovoLog to your pharmacy.  Please reach out to the infectious disease and liver  doctors that have included in your discharge summary today to see if you can get an appointment sooner than September 19.  You are also welcome to reach out to the hepatologist with whom you are currently scheduled to see if they can put you on a cancellation  list.  Thank you for visiting [facility] today.       Disposition Upon Discharge:  Condition: stable for discharge home  Patient presented with an acute illness with associated systemic symptoms and significant discomfort requiring urgent management. In my opinion, this is a condition that a prudent lay person (someone who possesses an average knowledge of health and medicine) may potentially expect to result in complications if not addressed urgently such as respiratory distress, impairment of bodily function or dysfunction of bodily organs.   Routine symptom specific, illness specific and/or disease specific instructions were discussed with the patient and/or caregiver at length.   As such, the patient has been evaluated and assessed, work-up was performed and treatment was provided in alignment with urgent care protocols and evidence based medicine.  Patient/parent/caregiver has been advised that the patient may require follow up for further testing and treatment if the symptoms continue in spite of treatment, as clinically indicated and appropriate.  Patient/parent/caregiver has been advised to return to the [facility] or PCP if no better; to PCP or the Emergency Department if new signs and symptoms develop, or if the current signs or symptoms continue to change or worsen for further workup, evaluation and treatment as clinically indicated and appropriate  The patient will follow up with their current PCP if and as advised. If the patient does not currently have a PCP we will assist them in obtaining one.   The patient may need specialty follow up if the symptoms continue, in spite of conservative treatment and management, for further workup, evaluation, consultation and treatment as clinically indicated and appropriate.  Patient/parent/caregiver verbalized understanding and agreement of plan as discussed.  All questions were addressed during visit.  Please see discharge  instructions below for further details of plan.  This office note has been dictated using Teaching laboratory technician.  Unfortunately, this method of dictation can sometimes lead to typographical or grammatical errors.  I apologize for your inconvenience in advance if this occurs.  Please do not hesitate to reach out to me if clarification is needed.      Theadora Rama Scales, [place] 08/11/22 [phone]

## 2022-08-11 NOTE — ED Triage Notes (Signed)
Patient states a culture came back positive for hep C 2 weeks ago. States she then got the 2 vaccines for Hep B.   Currently having weakness, fatigue, stiffness, dizziness, blood sugar in the 500s, edema in the hands and feet. Woke up fasting today at 200. Patient states she is taking her insulin.  Patient referred to specialty but can not be seen until 09/22/22.

## 2022-09-12 ENCOUNTER — Ambulatory Visit (HOSPITAL_COMMUNITY)
Admission: EM | Admit: 2022-09-12 | Discharge: 2022-09-12 | Disposition: A | Discharge status: Home / Self Care | Payer: Medicaid Other | Attending: Urgent Care | Admitting: Urgent Care

## 2022-09-12 DIAGNOSIS — E1165 Type 2 diabetes mellitus with hyperglycemia: Secondary | ICD-10-CM

## 2022-09-12 DIAGNOSIS — E1142 Type 2 diabetes mellitus with diabetic polyneuropathy: Secondary | ICD-10-CM

## 2022-09-12 DIAGNOSIS — R600 Localized edema: Secondary | ICD-10-CM

## 2022-09-12 LAB — POCT FASTING CBG KUC MANUAL ENTRY: POCT Glucose (KUC): 224 mg/dL — AB (ref 70–99)

## 2022-09-12 MED ORDER — GABAPENTIN 300 MG PO CAPS: 300.0000 mg | ORAL_CAPSULE | Freq: Two times a day (BID) | ORAL | 0 refills | Status: DC

## 2022-09-12 NOTE — ED Triage Notes (Signed)
Here for bilateral feet swelling that started over the weekend. Pt denies any other symptoms at time.

## 2022-09-12 NOTE — ED Provider Notes (Addendum)
[facility]    CSN: [id number] Arrival date & time: 09/12/22  1419      History   Chief Complaint No chief complaint on file.   HPI Crystal Boyer is a 62 y.o. female.   HPI  Presents to urgent care with complaint of bilateral lower extremity inflammation.  Symptoms x 3 days. Feet feel "tight". Endorses "sharp, stabbing pain in my feet". She says she has prescription for gabapentin 100 mg given for neuropathy about 6 months ago. Took 2 capsules at night and made her sleep and relieved pain but no longer working. Denies any negative SEs with gaba use. Did not take in the AM because she needed to work. Took again after work.  PMH includes acute hepatitis C virus infection without hepatic coma and DM 2 with long-term current use of insulin.  Most recent hemoglobin A1c is 9.5 (07/13/2022).  Review of the patient's chart reveals visit on 08/11/2022 for complaint of weakness, fatigue, muscle stiffness, dizziness, elevated blood sugar, bilateral upper and lower extremity swelling.  Past Medical History:  Diagnosis Date   Allergy    Anemia    Asthma    Depression    Diabetes mellitus    Gout 12 days ago   Hepatitis C    Hypertension    Stroke [facility])     Patient Active Problem List   Diagnosis Date Noted   Acute UTI 01/28/2021   Uncontrolled diabetes mellitus with hyperglycemia (HCC) 01/27/2021   AKI (acute kidney injury) (HCC) 01/27/2021   Hives 01/27/2021   Noncompliance with medications 01/27/2021   Acute lower UTI 01/27/2021   History of CVA (cerebrovascular accident) 04/30/2020   Achilles tendon contracture, left 09/25/2017   Posterior tibial tendon dysfunction (PTTD) of right lower extremity 09/25/2017   DIABETES MELLITUS, TYPE II 11/27/2006   Essential hypertension 11/27/2006   MICROALBUMINURIA 11/27/2006    Past Surgical History:  Procedure Laterality Date   ABDOMINAL HYSTERECTOMY     ANKLE FRACTURE SURGERY     right ankle   CESAREAN SECTION     IR  ANGIO INTRA EXTRACRAN SEL COM CAROTID INNOMINATE BILAT MOD SED  07/07/2020   IR ANGIO VERTEBRAL SEL VERTEBRAL UNI R MOD SED  07/07/2020   IR RADIOLOGIST EVAL & MGMT  06/19/2020   IR RADIOLOGIST EVAL & MGMT  09/22/2020   IR US GUIDE VASC ACCESS RIGHT  07/07/2020    OB History   No obstetric history on file.      Home Medications    Prior to Admission medications   Medication Sig Start Date End Date Taking? Authorizing Provider  amLODipine (NORVASC) 5 MG tablet Take 1 tablet (5 mg total) by mouth 2 (two) times daily. 06/25/22   Smoot, Shawn Route, PA-C  aspirin EC 81 MG EC tablet Take 1 tablet (81 mg total) by mouth daily. Swallow whole. Patient taking differently: Take 81 mg by mouth daily. 05/02/20   Dellia Cloud, MD  atorvastatin (LIPITOR) 80 MG tablet Take 1 tablet (80 mg total) by mouth daily. Patient taking differently: Take 80 mg by mouth at bedtime. 09/02/20 08/11/22  Ihor Austin, NP  blood glucose meter kit and supplies KIT Dispense based on patient and insurance preference. Use up to four times daily as directed. 01/29/21   Almon Hercules, MD  clopidogrel (PLAVIX) 75 MG tablet Take 75 mg by mouth daily.    [provider]  gabapentin (NEURONTIN) 100 MG capsule Take 100 mg by mouth 2 (two) times daily.  [provider]  glipiZIDE (GLUCOTROL XL) 2.5 MG 24 hr tablet Take 1 tablet by mouth daily with breakfast. 02/02/22   [provider]  insulin aspart (NOVOLOG FLEXPEN) 100 UNIT/ML FlexPen With meals and before bedtime, inject 1 unit for every 50 mg/DL increment greater than 150 mg/DL.  Maximum dose 7 units 4 times daily WC, 28 units/day. 08/11/22   Theadora Rama Scales, PA-C  insulin glargine (LANTUS) 100 UNIT/ML injection Inject 0.35 mLs (35 Units total) into the skin daily. 08/11/22 09/14/22  Theadora Rama Scales, PA-C  losartan (COZAAR) 100 MG tablet Take 1 tablet (100 mg total) by mouth daily. 02/23/22   Mardella Layman, MD  triamcinolone cream (KENALOG) 0.1 %  Apply 1 Application topically 2 (two) times daily. 05/25/22   Carlisle Beers, FNP  albuterol (PROVENTIL HFA;VENTOLIN HFA) 108 (90 Base) MCG/ACT inhaler Inhale 2 puffs into the lungs every 6 (six) hours as needed for wheezing or shortness of breath. Patient not taking: Reported on 07/03/2018 04/24/18 01/13/19  Arnaldo Natal, MD    Family History Family History  Problem Relation Age of Onset   Hypertension Mother    Diabetes Mother    Hypertension Father    Diabetes Father    Colon cancer Neg Hx     Social History Social History   Tobacco Use   Smoking status: Never   Smokeless tobacco: Never  Vaping Use   Vaping status: Never Used  Substance Use Topics   Alcohol use: Yes    Comment: social   Drug use: Not Currently    Types: Marijuana    Comment: 1x monthly     Allergies   Brilinta [ticagrelor], Egg-derived products, Actos [pioglitazone], Glipizide, and Tramadol   Review of Systems Review of Systems   Physical Exam Triage Vital Signs ED Triage Vitals  Encounter Vitals Group     BP      Systolic BP Percentile      Diastolic BP Percentile      Pulse      Resp      Temp      Temp src      SpO2      Weight      Height      Head Circumference      Peak Flow      Pain Score      Pain Loc      Pain Education      Exclude from Growth Chart    No data found.  Updated Vital Signs There were no vitals taken for this visit.  Visual Acuity Right Eye Distance:   Left Eye Distance:   Bilateral Distance:    Right Eye Near:   Left Eye Near:    Bilateral Near:     Physical Exam   UC Treatments / Results  Labs (all labs ordered are listed, but only abnormal results are displayed) Labs Reviewed - No data to display  EKG   Radiology No results found.  Procedures Procedures (including critical care time)  Medications Ordered in UC Medications - No data to display  Initial Impression / Assessment and Plan / UC Course  I have reviewed the  triage vital signs and the nursing notes.  Pertinent labs & imaging results that were available during my care of the patient were reviewed by me and considered in my medical decision making (see chart for details).   Crystal Boyer is a 62 y.o. female presenting with bilat eral lower extremity  swelling and pain. Patient is afebrile without recent antipyretics, satting well on room air. Overall is well appearing well hydrated, without respiratory distress. Pulmonary exam is unremarkable.  Lungs CTAB without wheezing, rhonchi, rales. RRR without murmurs, rubs, gallops.  No significant lower extremity swelling is noted though patient points out slight indentation in her forefoot as a result of wearing sandals.  Reviewed relevant chart history.  POC BG = 224 today in clinic.  Considered ordering labs today but patient states she has PCP visit in about 2 weeks.  Will defer labs to PCP.  Recommending use of compression stockings to relieve any complaint of lower extremity edema.  Given she endorses effectiveness of gabapentin 2-300, will reorder this medication for her for treatment of neuropathy and ask her to follow-up with her PCP regarding any refills.  Counseled patient on potential for adverse effects with medications prescribed/recommended today, ER and return-to-clinic precautions discussed, patient verbalized understanding and agreement with care plan.  Final Clinical Impressions(s) / UC Diagnoses   Final diagnoses:  None   Discharge Instructions   None    ED Prescriptions   None    PDMP not reviewed this encounter.   Charma Igo, FNP 09/12/22 1523    Charma Igo, FNP 09/12/22 1524

## 2022-09-12 NOTE — Discharge Instructions (Addendum)
Recommend obtaining compression stockings to be used during the day and removed at night.  This will help to relieve swelling in your legs and ankles.  Please follow-up with your primary care provider for refills of gabapentin.

## 2022-10-04 ENCOUNTER — Ambulatory Visit (INDEPENDENT_AMBULATORY_CARE_PROVIDER_SITE_OTHER): Payer: Medicaid Other

## 2022-10-04 ENCOUNTER — Ambulatory Visit (HOSPITAL_COMMUNITY)
Admission: EM | Admit: 2022-10-04 | Discharge: 2022-10-04 | Disposition: A | Discharge status: Home / Self Care | Payer: Medicaid Other | Attending: Family Medicine | Admitting: Family Medicine

## 2022-10-04 ENCOUNTER — Encounter (HOSPITAL_COMMUNITY): Payer: Self-pay

## 2022-10-04 DIAGNOSIS — M25571 Pain in right ankle and joints of right foot: Secondary | ICD-10-CM

## 2022-10-04 DIAGNOSIS — R600 Localized edema: Secondary | ICD-10-CM | POA: Diagnosis not present

## 2022-10-04 DIAGNOSIS — M79671 Pain in right foot: Secondary | ICD-10-CM | POA: Diagnosis not present

## 2022-10-04 DIAGNOSIS — M19071 Primary osteoarthritis, right ankle and foot: Secondary | ICD-10-CM | POA: Diagnosis not present

## 2022-10-04 DIAGNOSIS — S93401A Sprain of unspecified ligament of right ankle, initial encounter: Secondary | ICD-10-CM | POA: Diagnosis not present

## 2022-10-04 MED ORDER — HYDROCODONE-ACETAMINOPHEN 5-325 MG PO TABS: 1.0000 | ORAL_TABLET | Freq: Four times a day (QID) | ORAL | 0 refills | Status: DC | PRN

## 2022-10-04 MED ORDER — KETOROLAC TROMETHAMINE 30 MG/ML IJ SOLN
INTRAMUSCULAR | Status: AC
  Filled 2022-10-04: qty 1

## 2022-10-04 MED ORDER — KETOROLAC TROMETHAMINE 30 MG/ML IJ SOLN
30.0000 mg | Freq: Once | INTRAMUSCULAR | Status: AC
  Administered 2022-10-04: 30 mg via INTRAMUSCULAR

## 2022-10-04 NOTE — ED Provider Notes (Addendum)
[facility]    CSN: [id number] Arrival date & time: 10/04/22  1101      History   Chief Complaint Chief Complaint  Patient presents with   Fall    HPI Crystal Boyer is a 62 y.o. female.    Fall  Fell down stairs at work on 8/23. She hit the outside of her right foot and rolled in the ankle when she fell.  Does have a h/o an old injury in that ankle.  Past Medical History:  Diagnosis Date   Allergy    Anemia    Asthma    Depression    Diabetes mellitus    Gout 12 days ago   Hepatitis C    Hypertension    Stroke [facility])     Patient Active Problem List   Diagnosis Date Noted   Acute UTI 01/28/2021   Uncontrolled diabetes mellitus with hyperglycemia (HCC) 01/27/2021   AKI (acute kidney injury) (HCC) 01/27/2021   Hives 01/27/2021   Noncompliance with medications 01/27/2021   Acute lower UTI 01/27/2021   History of CVA (cerebrovascular accident) 04/30/2020   Achilles tendon contracture, left 09/25/2017   Posterior tibial tendon dysfunction (PTTD) of right lower extremity 09/25/2017   DIABETES MELLITUS, TYPE II 11/27/2006   Essential hypertension 11/27/2006   MICROALBUMINURIA 11/27/2006    Past Surgical History:  Procedure Laterality Date   ABDOMINAL HYSTERECTOMY     ANKLE FRACTURE SURGERY     right ankle   CESAREAN SECTION     IR ANGIO INTRA EXTRACRAN SEL COM CAROTID INNOMINATE BILAT MOD SED  07/07/2020   IR ANGIO VERTEBRAL SEL VERTEBRAL UNI R MOD SED  07/07/2020   IR RADIOLOGIST EVAL & MGMT  06/19/2020   IR RADIOLOGIST EVAL & MGMT  09/22/2020   IR US GUIDE VASC ACCESS RIGHT  07/07/2020    OB History   No obstetric history on file.      Home Medications    Prior to Admission medications   Medication Sig Start Date End Date Taking? Authorizing Provider  HYDROcodone-acetaminophen (NORCO/VICODIN) 5-325 MG tablet Take 1 tablet by mouth every 6 (six) hours as needed (pain). 10/04/22  Yes Zenia Resides, MD  amLODipine (NORVASC) 5 MG  tablet Take 1 tablet (5 mg total) by mouth 2 (two) times daily. 06/25/22   Smoot, Shawn Route, PA-C  aspirin EC 81 MG EC tablet Take 1 tablet (81 mg total) by mouth daily. Swallow whole. Patient taking differently: Take 81 mg by mouth daily. 05/02/20   Dellia Cloud, MD  atorvastatin (LIPITOR) 80 MG tablet Take 1 tablet (80 mg total) by mouth daily. Patient taking differently: Take 80 mg by mouth at bedtime. 09/02/20 08/11/22  Ihor Austin, NP  blood glucose meter kit and supplies KIT Dispense based on patient and insurance preference. Use up to four times daily as directed. 01/29/21   Almon Hercules, MD  clopidogrel (PLAVIX) 75 MG tablet Take 75 mg by mouth daily.    [provider]  gabapentin (NEURONTIN) 100 MG capsule Take 100 mg by mouth 2 (two) times daily.    [provider]  gabapentin (NEURONTIN) 300 MG capsule Take 1 capsule (300 mg total) by mouth 2 (two) times daily. 09/12/22 10/12/22  Immordino, Jeannett Senior, FNP  glipiZIDE (GLUCOTROL XL) 2.5 MG 24 hr tablet Take 1 tablet by mouth daily with breakfast. 02/02/22   [provider]  insulin aspart (NOVOLOG FLEXPEN) 100 UNIT/ML FlexPen With meals and before bedtime, inject 1 unit for  every 50 mg/DL increment greater than 150 mg/DL.  Maximum dose 7 units 4 times daily WC, 28 units/day. 08/11/22   Theadora Rama Scales, PA-C  insulin glargine (LANTUS) 100 UNIT/ML injection Inject 0.35 mLs (35 Units total) into the skin daily. 08/11/22 09/14/22  Theadora Rama Scales, PA-C  losartan (COZAAR) 100 MG tablet Take 1 tablet (100 mg total) by mouth daily. 02/23/22   Mardella Layman, MD  triamcinolone cream (KENALOG) 0.1 % Apply 1 Application topically 2 (two) times daily. 05/25/22   Carlisle Beers, FNP  albuterol (PROVENTIL HFA;VENTOLIN HFA) 108 (90 Base) MCG/ACT inhaler Inhale 2 puffs into the lungs every 6 (six) hours as needed for wheezing or shortness of breath. Patient not taking: Reported on 07/03/2018 04/24/18 01/13/19  Arnaldo Natal, MD    Family History Family History  Problem Relation Age of Onset   Hypertension Mother    Diabetes Mother    Hypertension Father    Diabetes Father    Colon cancer Neg Hx     Social History Social History   Tobacco Use   Smoking status: Never   Smokeless tobacco: Never  Vaping Use   Vaping status: Never Used  Substance Use Topics   Alcohol use: Yes    Comment: social   Drug use: Not Currently    Types: Marijuana    Comment: 1x monthly     Allergies   Brilinta [ticagrelor], Egg-derived products, Actos [pioglitazone], Glipizide, and Tramadol   Review of Systems Review of Systems   Physical Exam Triage Vital Signs ED Triage Vitals  Encounter Vitals Group     BP 10/04/22 1156 136/78     Systolic BP Percentile --      Diastolic BP Percentile --      Pulse Rate 10/04/22 1156 73     Resp 10/04/22 1156 17     Temp --      Temp src --      SpO2 10/04/22 1156 95 %     Weight --      Height --      Head Circumference --      Peak Flow --      Pain Score 10/04/22 1155 0     Pain Loc --      Pain Education --      Exclude from Growth Chart --    No data found.  Updated Vital Signs BP 136/78 (BP Location: Right Arm)   Pulse 73   Resp 17   SpO2 95%   Visual Acuity Right Eye Distance:   Left Eye Distance:   Bilateral Distance:    Right Eye Near:   Left Eye Near:    Bilateral Near:     Physical Exam Constitutional:      General: She is not in acute distress.    Appearance: She is not ill-appearing, toxic-appearing or diaphoretic.  Musculoskeletal:     Comments: There is a well-healed linear scar on her right lateral malleolus.  The medial and lateral right ankle is swollen and the swelling extends onto her distal foot.  All that area is tender also and ecchymotic.  Pulses are normal  Skin:    Coloration: Skin is not pale.  Neurological:     Mental Status: She is alert and oriented to person, place, and time.  Psychiatric:        Behavior:  Behavior normal.      UC Treatments / Results  Labs (all labs ordered are listed, but  only abnormal results are displayed) Labs Reviewed - No data to display  EKG   Radiology No results found.  Procedures Procedures (including critical care time)  Medications Ordered in UC Medications  ketorolac (TORADOL) 30 MG/ML injection 30 mg (has no administration in time range)    Initial Impression / Assessment and Plan / UC Course  I have reviewed the triage vital signs and the nursing notes.  Pertinent labs & imaging results that were available during my care of the patient were reviewed by me and considered in my medical decision making (see chart for details).      Last EGFR was greater than 60 in May of this year  By my review of her x-rays I can see old changes from her old fractures on the ankle x-ray.  There is possibly a fracture at the base of her fifth metatarsal.  Boot is placed today and crutches are provided for her to partially weight-bear  She is given contact information for foot and ankle specialists, and hydrocodone is sent in for pain relief. She is advised of radiology over read and we will notify her if there is anything significantly different in their interpretation.   2120: I attempted to call patient to discuss the x-ray interpretation.  They did not see any acute fractures.  There was no answer at either phone number.  I still think she should see foot and ankle specialist since her foot was so swollen.  Final Clinical Impressions(s) / UC Diagnoses   Final diagnoses:  Acute right ankle pain  Right foot pain     Discharge Instructions      There is possibly a small fracture at the base of your 5th metatarsal in your foot. Your ankle bones show changes from the old fractures. I cannot see any new fractures.  The radiologist will read your x-ray and if there interpretation is significantly different from mine, we will call you.  Hydrocodone 5  mg--1 tablet every 6 hours as needed for pain.  This is best taken with food.  It can cause sleepiness or dizziness  You will not be able to drive with the boot on.  It would be best to use the crutches at least at first so that you cannot have to bear all your weight on your right foot.     ED Prescriptions     Medication Sig Dispense Auth. Provider   HYDROcodone-acetaminophen (NORCO/VICODIN) 5-325 MG tablet Take 1 tablet by mouth every 6 (six) hours as needed (pain). 12 tablet Melisssa Donner, Janace Aris, MD      I have reviewed the PDMP during this encounter.   Zenia Resides, MD 10/04/22 1334    Zenia Resides, MD 10/04/22 2120

## 2022-10-04 NOTE — Discharge Instructions (Signed)
There is possibly a small fracture at the base of your 5th metatarsal in your foot. Your ankle bones show changes from the old fractures. I cannot see any new fractures.  The radiologist will read your x-ray and if there interpretation is significantly different from mine, we will call you.  Hydrocodone 5 mg--1 tablet every 6 hours as needed for pain.  This is best taken with food.  It can cause sleepiness or dizziness  You will not be able to drive with the boot on.  It would be best to use the crutches at least at first so that you cannot have to bear all your weight on your right foot.

## 2022-10-04 NOTE — ED Triage Notes (Signed)
Pt presents with c/o lt ankle pain after a fall last week. States she has swelling and c/o limited ROM. States she's been limping. Reports she's been feeling weak.

## 2022-10-27 ENCOUNTER — Other Ambulatory Visit: Payer: Self-pay | Admitting: Nurse Practitioner

## 2022-10-27 DIAGNOSIS — B182 Chronic viral hepatitis C: Secondary | ICD-10-CM | POA: Diagnosis not present

## 2022-10-27 DIAGNOSIS — Z794 Long term (current) use of insulin: Secondary | ICD-10-CM | POA: Diagnosis not present

## 2022-10-27 DIAGNOSIS — E119 Type 2 diabetes mellitus without complications: Secondary | ICD-10-CM | POA: Diagnosis not present

## 2022-10-27 DIAGNOSIS — Z23 Encounter for immunization: Secondary | ICD-10-CM | POA: Diagnosis not present

## 2022-10-27 DIAGNOSIS — M25571 Pain in right ankle and joints of right foot: Secondary | ICD-10-CM | POA: Diagnosis not present

## 2022-10-28 DIAGNOSIS — M19171 Post-traumatic osteoarthritis, right ankle and foot: Secondary | ICD-10-CM | POA: Diagnosis not present

## 2022-11-07 ENCOUNTER — Other Ambulatory Visit: Payer: Self-pay | Admitting: Physician Assistant

## 2022-11-07 DIAGNOSIS — Z1231 Encounter for screening mammogram for malignant neoplasm of breast: Secondary | ICD-10-CM

## 2022-11-11 ENCOUNTER — Other Ambulatory Visit: Payer: 59

## 2022-11-22 ENCOUNTER — Ambulatory Visit
Admission: RE | Admit: 2022-11-22 | Discharge: 2022-11-22 | Disposition: A | Discharge status: Home / Self Care | Payer: 59 | Source: Ambulatory Visit | Attending: Nurse Practitioner | Admitting: Nurse Practitioner

## 2022-11-22 DIAGNOSIS — B182 Chronic viral hepatitis C: Secondary | ICD-10-CM

## 2022-12-02 ENCOUNTER — Ambulatory Visit: Payer: 59

## 2022-12-03 ENCOUNTER — Ambulatory Visit
Admission: RE | Admit: 2022-12-03 | Discharge: 2022-12-03 | Disposition: A | Discharge status: Home / Self Care | Payer: 59 | Source: Ambulatory Visit | Attending: Physician Assistant | Admitting: Physician Assistant

## 2022-12-03 DIAGNOSIS — Z1231 Encounter for screening mammogram for malignant neoplasm of breast: Secondary | ICD-10-CM

## 2023-01-03 DIAGNOSIS — B182 Chronic viral hepatitis C: Secondary | ICD-10-CM | POA: Diagnosis not present

## 2023-01-26 DIAGNOSIS — Z794 Long term (current) use of insulin: Secondary | ICD-10-CM | POA: Diagnosis not present

## 2023-01-26 DIAGNOSIS — E119 Type 2 diabetes mellitus without complications: Secondary | ICD-10-CM | POA: Diagnosis not present

## 2023-01-26 DIAGNOSIS — B182 Chronic viral hepatitis C: Secondary | ICD-10-CM | POA: Diagnosis not present

## 2023-01-26 DIAGNOSIS — I10 Essential (primary) hypertension: Secondary | ICD-10-CM | POA: Diagnosis not present

## 2023-01-26 DIAGNOSIS — E785 Hyperlipidemia, unspecified: Secondary | ICD-10-CM | POA: Diagnosis not present

## 2023-01-26 DIAGNOSIS — Z91148 Patient's other noncompliance with medication regimen for other reason: Secondary | ICD-10-CM | POA: Diagnosis not present

## 2023-02-17 DIAGNOSIS — K7401 Hepatic fibrosis, early fibrosis: Secondary | ICD-10-CM | POA: Diagnosis not present

## 2023-02-17 DIAGNOSIS — B182 Chronic viral hepatitis C: Secondary | ICD-10-CM | POA: Diagnosis not present

## 2023-04-11 ENCOUNTER — Ambulatory Visit (HOSPITAL_COMMUNITY)
Admission: EM | Admit: 2023-04-11 | Discharge: 2023-04-11 | Disposition: A | Discharge status: Home / Self Care | Attending: Family Medicine | Admitting: Family Medicine

## 2023-04-11 ENCOUNTER — Ambulatory Visit (INDEPENDENT_AMBULATORY_CARE_PROVIDER_SITE_OTHER)

## 2023-04-11 ENCOUNTER — Encounter (HOSPITAL_COMMUNITY): Payer: Self-pay | Admitting: *Deleted

## 2023-04-11 DIAGNOSIS — R0602 Shortness of breath: Secondary | ICD-10-CM | POA: Diagnosis not present

## 2023-04-11 DIAGNOSIS — J4521 Mild intermittent asthma with (acute) exacerbation: Secondary | ICD-10-CM

## 2023-04-11 MED ORDER — BENZONATATE 100 MG PO CAPS: ORAL_CAPSULE | ORAL | 0 refills | Status: DC

## 2023-04-11 MED ORDER — PREDNISONE 20 MG PO TABS: 40.0000 mg | ORAL_TABLET | Freq: Every day | ORAL | 0 refills | Status: DC

## 2023-04-11 MED ORDER — ALBUTEROL SULFATE HFA 108 (90 BASE) MCG/ACT IN AERS: 1.0000 | INHALATION_SPRAY | Freq: Four times a day (QID) | RESPIRATORY_TRACT | 1 refills | Status: AC | PRN

## 2023-04-11 NOTE — ED Triage Notes (Signed)
 Pt states she has cough and congestion X 1 week. 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 She hasn't been taking nay meds but states she is out of the inhaler she had in the past.

## 2023-04-12 NOTE — ED Provider Notes (Signed)
 [facility]   [id number] 04/11/23 Arrival Time: 1044  ASSESSMENT & PLAN:  1. SOB (shortness of breath)   2. Mild intermittent asthma with acute exacerbation    Without resp distress. I have personally viewed and independently interpreted the imaging studies ordered this visit. CXR: no acute changes.   Meds ordered this encounter  Medications   predniSONE (DELTASONE) 20 MG tablet    Sig: Take 2 tablets (40 mg total) by mouth daily.    Dispense:  10 tablet    Refill:  0   benzonatate (TESSALON) 100 MG capsule    Sig: Take 1 capsule by mouth every 8 (eight) hours for cough.    Dispense:  21 capsule    Refill:  0   albuterol (VENTOLIN HFA) 108 (90 Base) MCG/ACT inhaler    Sig: Inhale 1-2 puffs into the lungs every 6 (six) hours as needed for wheezing or shortness of breath.    Dispense:  1 each    Refill:  1    Asthma precautions given. OTC symptom care as needed.  Recommend:  Follow-up Information     Donato Schultz, FNP.   Specialty: Family Medicine Why: If worsening or failing to improve as anticipated. Contact information: [address] [place] [address] [phone]                 Reviewed expectations re: course of current medical issues. Questions answered. Outlined signs and symptoms indicating need for more acute intervention. Patient verbalized understanding. After Visit Summary given.  SUBJECTIVE: History from: patient.  Crystal Boyer is a 63 y.o. female who presents with complaint of cough/chest congestion; past week; is SOB at times, esp with wheezing; h/o asthma; out of inhalers. Unsure of fever/temp. Feels fatigued. Denies CP. No tx PTA.  Social History   Tobacco Use  Smoking Status Never  Smokeless Tobacco Never    OBJECTIVE:  Vitals:   04/11/23 1137  BP: (!) 148/90  Pulse: 73  Resp: 18  Temp: 98.4 F (36.9 C)  TempSrc: Oral  SpO2: 97%    General appearance: alert; NAD HEENT: [place]; AT; with  mild nasal congestion Neck: supple without LAD Cv: RRR without murmer Lungs: unlabored respirations, moderate bilateral expiratory wheezing; cough: mild; no significant respiratory distress Skin: warm and dry Psychological: alert and cooperative; normal mood and affect  Imaging: DG Chest 2 View Result Date: 04/11/2023 CLINICAL DATA:  Shortness of breath and cough EXAM: CHEST - 2 VIEW COMPARISON:  Chest radiograph 01/29/2022 FINDINGS: The heart size and mediastinal contours are within normal limits. Both lungs are clear. The visualized skeletal structures are unremarkable. IMPRESSION: No active cardiopulmonary disease. Electronically Signed   By: Annia Belt M.D.   On: 04/11/2023 13:38    Allergies  Allergen Reactions   Brilinta [Ticagrelor] Shortness Of Breath   Egg-Derived Products Diarrhea   Actos [Pioglitazone] Other (See Comments)    Made the patient feel like she was "going to die"   Glipizide Other (See Comments)    Made patient feel like she was "going to pass out and die"   Diphenhydramine Hcl Rash   Tramadol Rash    Past Medical History:  Diagnosis Date   Allergy    Anemia    Asthma    Depression    Diabetes mellitus    Gout 12 days ago   Hepatitis C    Hypertension    Stroke [facility])    Family History  Problem Relation Age of  Onset   Hypertension Mother    Diabetes Mother    Hypertension Father    Diabetes Father    Colon cancer Neg Hx    Social History   Socioeconomic History   Marital status: Single    Spouse name: Not on file   Number of children: 3   Years of education: Not on file   Highest education level: Not on file  Occupational History    Comment: preschool teacher  Tobacco Use   Smoking status: Never   Smokeless tobacco: Never  Vaping Use   Vaping status: Never Used  Substance and Sexual Activity   Alcohol use: Yes    Comment: social   Drug use: Not Currently    Types: Marijuana    Comment: 1x monthly   Sexual activity: Yes    Birth  control/protection: Condom  Other Topics Concern   Not on file  Social History Narrative   Lives alone   Social Drivers of Health   Financial Resource Strain: Not on File (05/27/2021)   Received from [employer] [employer]    Financial Resource Strain: 0  Food Insecurity: Not on File (11/03/2022)   Received from [facility] Insecurity    Food: 0  Transportation Needs: Not on File (05/27/2021)   Received from [place] [employer] Needs    Transportation: 0  Physical Activity: Not on File (05/27/2021)   Received from [place], [place]   Physical Activity    Physical Activity: 0  Stress: Not on File (05/27/2021)   Received from [facility], [place]   Stress    Stress: 0  Social Connections: Not on File (10/20/2022)   Received from [facility]   Social Connections    Connectedness: 0  Intimate Partner Violence: Not on file             Mardella Layman, MD 04/12/23 0830

## 2023-08-04 ENCOUNTER — Encounter (HOSPITAL_COMMUNITY): Payer: Self-pay | Admitting: Interventional Radiology

## 2023-08-06 ENCOUNTER — Other Ambulatory Visit: Payer: Self-pay

## 2023-08-06 ENCOUNTER — Emergency Department (HOSPITAL_COMMUNITY)
Admission: EM | Admit: 2023-08-06 | Discharge: 2023-08-06 | Discharge status: Against Medical Advice | Attending: Emergency Medicine | Admitting: Emergency Medicine

## 2023-08-06 ENCOUNTER — Encounter (HOSPITAL_COMMUNITY): Payer: Self-pay | Admitting: Emergency Medicine

## 2023-08-06 DIAGNOSIS — L509 Urticaria, unspecified: Secondary | ICD-10-CM | POA: Insufficient documentation

## 2023-08-06 DIAGNOSIS — R21 Rash and other nonspecific skin eruption: Secondary | ICD-10-CM | POA: Diagnosis present

## 2023-08-06 DIAGNOSIS — Z5321 Procedure and treatment not carried out due to patient leaving prior to being seen by health care provider: Secondary | ICD-10-CM | POA: Diagnosis not present

## 2023-08-06 NOTE — ED Triage Notes (Signed)
 Pt to ED from home c/o rash to back and arms.  Pt states bought new mattress a week ago and noticed itching throughout the week and rash today.  Pt with a couple raised hives to left shoulder, no other obvious rashes.  Denies SOB.

## 2023-08-06 NOTE — ED Notes (Signed)
Pt stated she was leaving AMA due to wait 

## 2023-08-07 ENCOUNTER — Ambulatory Visit (HOSPITAL_COMMUNITY)
Admission: EM | Admit: 2023-08-07 | Discharge: 2023-08-07 | Disposition: A | Discharge status: Home / Self Care | Attending: Emergency Medicine | Admitting: Emergency Medicine

## 2023-08-07 ENCOUNTER — Encounter (HOSPITAL_COMMUNITY): Payer: Self-pay | Admitting: Emergency Medicine

## 2023-08-07 DIAGNOSIS — L299 Pruritus, unspecified: Secondary | ICD-10-CM | POA: Diagnosis not present

## 2023-08-07 DIAGNOSIS — W57XXXA Bitten or stung by nonvenomous insect and other nonvenomous arthropods, initial encounter: Secondary | ICD-10-CM | POA: Diagnosis not present

## 2023-08-07 MED ORDER — HYDROCORTISONE 1 % EX CREA: TOPICAL_CREAM | CUTANEOUS | 0 refills | Status: AC

## 2023-08-07 MED ORDER — CETIRIZINE HCL 10 MG PO TABS: 10.0000 mg | ORAL_TABLET | Freq: Every day | ORAL | 0 refills | Status: AC

## 2023-08-07 NOTE — ED Provider Notes (Signed)
 [facility]    CSN: [id number] Arrival date & time: 08/07/23  0801      History   Chief Complaint Chief Complaint  Patient presents with   Pruritis   Rash    HPI Crystal Boyer is a 63 y.o. female.   Patient presents with concerns for bedbug bites to her back.  Patient states that she bought a new bed on 6/2 and has had some itching since then.  Patient states that she has not seen any bugs on her better in her house.  But continues to have itching for the last few weeks.  Patient states that she has been applying alcohol to the areas of itching with minimal relief.  The history is provided by the patient and medical records.  Rash   Past Medical History:  Diagnosis Date   Allergy    Anemia    Asthma    Depression    Diabetes mellitus    Gout 12 days ago   Hepatitis C    Hypertension    Stroke [facility])     Patient Active Problem List   Diagnosis Date Noted   Acute UTI 01/28/2021   Uncontrolled diabetes mellitus with hyperglycemia (HCC) 01/27/2021   AKI (acute kidney injury) (HCC) 01/27/2021   Hives 01/27/2021   Noncompliance with medications 01/27/2021   Acute lower UTI 01/27/2021   History of CVA (cerebrovascular accident) 04/30/2020   Achilles tendon contracture, left 09/25/2017   Posterior tibial tendon dysfunction (PTTD) of right lower extremity 09/25/2017   DIABETES MELLITUS, TYPE II 11/27/2006   Essential hypertension 11/27/2006   MICROALBUMINURIA 11/27/2006    Past Surgical History:  Procedure Laterality Date   ABDOMINAL HYSTERECTOMY     ANKLE FRACTURE SURGERY     right ankle   CESAREAN SECTION     IR ANGIO INTRA EXTRACRAN SEL COM CAROTID INNOMINATE BILAT MOD SED  07/07/2020   IR ANGIO VERTEBRAL SEL VERTEBRAL UNI R MOD SED  07/07/2020   IR RADIOLOGIST EVAL & MGMT  06/19/2020   IR RADIOLOGIST EVAL & MGMT  09/22/2020   IR US  GUIDE VASC ACCESS RIGHT  07/07/2020    OB History   No obstetric history on file.      Home Medications     Prior to Admission medications   Medication Sig Start Date End Date Taking? Authorizing Provider  cetirizine  (ZYRTEC  ALLERGY) 10 MG tablet Take 1 tablet (10 mg total) by mouth daily. 08/07/23  Yes Johnie Flaming A, NP  hydrocortisone cream 1 % Apply to affected area 2 times daily 08/07/23  Yes Johnie Flaming A, NP  albuterol  (VENTOLIN  HFA) 108 (90 Base) MCG/ACT inhaler Inhale 1-2 puffs into the lungs every 6 (six) hours as needed for wheezing or shortness of breath. 04/11/23   Rolinda Rogue, MD  amLODipine  (NORVASC ) 5 MG tablet Take 1 tablet (5 mg total) by mouth 2 (two) times daily. 06/25/22   Smoot, Lauraine LABOR, PA-C  aspirin  EC 81 MG EC tablet Take 1 tablet (81 mg total) by mouth daily. Swallow whole. Patient taking differently: Take 81 mg by mouth daily. 05/02/20   Barbaraann Katz, MD  atorvastatin  (LIPITOR ) 80 MG tablet Take 1 tablet (80 mg total) by mouth daily. Patient taking differently: Take 80 mg by mouth at bedtime. 09/02/20 08/11/22  Whitfield Raisin, NP  benzonatate  (TESSALON ) 100 MG capsule Take 1 capsule by mouth every 8 (eight) hours for cough. 04/11/23   Rolinda Rogue, MD  blood glucose meter kit and supplies KIT  Dispense based on patient and insurance preference. Use up to four times daily as directed. 01/29/21   Gonfa, Taye T, MD  clopidogrel  (PLAVIX ) 75 MG tablet Take 75 mg by mouth daily.    [provider]  gabapentin  (NEURONTIN ) 100 MG capsule Take 100 mg by mouth 2 (two) times daily.    [provider]  gabapentin  (NEURONTIN ) 300 MG capsule Take 1 capsule (300 mg total) by mouth 2 (two) times daily. 09/12/22 10/12/22  Immordino, Garnette, FNP  glipiZIDE (GLUCOTROL XL) 2.5 MG 24 hr tablet Take 1 tablet by mouth daily with breakfast. 02/02/22   [provider]  HYDROcodone -acetaminophen  (NORCO/VICODIN) 5-325 MG tablet Take 1 tablet by mouth every 6 (six) hours as needed (pain). 10/04/22   Vonna Sharlet POUR, MD  insulin  aspart (NOVOLOG  FLEXPEN) 100 UNIT/ML FlexPen  With meals and before bedtime, inject 1 unit for every 50 mg/DL increment greater than 150 mg/DL.  Maximum dose 7 units 4 times daily WC, 28 units/day. 08/11/22   Joesph Shaver Scales, PA-C  insulin  glargine (LANTUS ) 100 UNIT/ML injection Inject 0.35 mLs (35 Units total) into the skin daily. 08/11/22 09/14/22  Joesph Shaver Scales, PA-C  insulin  glargine (LANTUS ) 100 UNIT/ML injection Inject 30 Units into the skin at bedtime.    [provider]  losartan  (COZAAR ) 100 MG tablet Take 1 tablet (100 mg total) by mouth daily. 02/23/22   Rolinda Rogue, MD  predniSONE  (DELTASONE ) 20 MG tablet Take 2 tablets (40 mg total) by mouth daily. 04/11/23   Rolinda Rogue, MD  triamcinolone  cream (KENALOG ) 0.1 % Apply 1 Application topically 2 (two) times daily. 05/25/22   Enedelia Dorna HERO, FNP    Family History Family History  Problem Relation Age of Onset   Hypertension Mother    Diabetes Mother    Hypertension Father    Diabetes Father    Colon cancer Neg Hx     Social History Social History   Tobacco Use   Smoking status: Never   Smokeless tobacco: Never  Vaping Use   Vaping status: Never Used  Substance Use Topics   Alcohol use: Yes    Comment: social   Drug use: Not Currently    Types: Marijuana    Comment: 1x monthly     Allergies   Brilinta  [ticagrelor ], Egg-derived products, Actos [pioglitazone], Glipizide, Diphenhydramine  hcl, and Tramadol   Review of Systems Review of Systems  Skin:  Positive for rash.   Per HPI  Physical Exam Triage Vital Signs ED Triage Vitals  Encounter Vitals Group     BP 08/07/23 0824 (!) 158/91     Girls Systolic BP Percentile --      Girls Diastolic BP Percentile --      Boys Systolic BP Percentile --      Boys Diastolic BP Percentile --      Pulse Rate 08/07/23 0824 75     Resp 08/07/23 0824 17     Temp 08/07/23 0824 97.9 F (36.6 C)     Temp Source 08/07/23 0824 Oral     SpO2 08/07/23 0824 97 %     Weight --      Height --       Head Circumference --      Peak Flow --      Pain Score 08/07/23 0823 0     Pain Loc --      Pain Education --      Exclude from Growth Chart --    No data  found.  Updated Vital Signs BP (!) 158/91 (BP Location: Left Arm)   Pulse 75   Temp 97.9 F (36.6 C) (Oral)   Resp 17   SpO2 97%   Visual Acuity Right Eye Distance:   Left Eye Distance:   Bilateral Distance:    Right Eye Near:   Left Eye Near:    Bilateral Near:     Physical Exam Vitals and nursing note reviewed.  Constitutional:      General: She is awake. She is not in acute distress.    Appearance: Normal appearance. She is well-developed and well-groomed. She is not ill-appearing.   Skin:    General: Skin is warm and dry.     Findings: Erythema present.     Comments: 3 pinpoint sized areas of erythema noted to the left posterior thoracic region that appear to be consistent with bug bites. Scabbed over scratch marks to her lateral left upper arm.   Neurological:     Mental Status: She is alert.   Psychiatric:        Behavior: Behavior is cooperative.      UC Treatments / Results  Labs (all labs ordered are listed, but only abnormal results are displayed) Labs Reviewed - No data to display  EKG   Radiology No results found.  Procedures Procedures (including critical care time)  Medications Ordered in UC Medications - No data to display  Initial Impression / Assessment and Plan / UC Course  I have reviewed the triage vital signs and the nursing notes.  Pertinent labs & imaging results that were available during my care of the patient were reviewed by me and considered in my medical decision making (see chart for details).     Patient is overall well-appearing.  Vitals are stable.  Upon assessment there are 3 pinpoint sized areas of erythema noted to the left posterior thoracic region that appear to be consistent with bug bites.  Patient also has scabbed over scratch marks to her lateral left  upper arm from where she has been scratching.  Prescribed hydrocortisone to apply to the bug bites.  Prescribed cetirizine  to take once daily to help with itching.  Discussed follow-up and return precautions Final Clinical Impressions(s) / UC Diagnoses   Final diagnoses:  Bug bite, initial encounter  Pruritus     Discharge Instructions      Start applying hydrocortisone cream twice daily to the affected areas. Take cetirizine  once daily to help with itching. Recommend getting an exterminator to come out and inspect your home to ensure there is no presence of bedbugs. Follow-up with your primary care provider or return here as needed.    ED Prescriptions     Medication Sig Dispense Auth. Provider   hydrocortisone cream 1 % Apply to affected area 2 times daily 15 g Johnie Flaming A, NP   cetirizine  (ZYRTEC  ALLERGY) 10 MG tablet Take 1 tablet (10 mg total) by mouth daily. 30 tablet Johnie Flaming A, NP      PDMP not reviewed this encounter.   Johnie Flaming A, [place] 08/07/23 [phone]

## 2023-08-07 NOTE — Discharge Instructions (Signed)
 Start applying hydrocortisone cream twice daily to the affected areas. Take cetirizine  once daily to help with itching. Recommend getting an exterminator to come out and inspect your home to ensure there is no presence of bedbugs. Follow-up with your primary care provider or return here as needed.

## 2023-08-07 NOTE — ED Triage Notes (Signed)
 Pt reports bought a bed on 6/2 and since did been breaking out and itching. Hadn't seen any bugs. Was spraying the bed down.

## 2023-09-06 ENCOUNTER — Ambulatory Visit (HOSPITAL_COMMUNITY)
Admission: EM | Admit: 2023-09-06 | Discharge: 2023-09-06 | Disposition: A | Discharge status: Home / Self Care | Attending: Emergency Medicine | Admitting: Emergency Medicine

## 2023-09-06 ENCOUNTER — Encounter (HOSPITAL_COMMUNITY): Payer: Self-pay

## 2023-09-06 DIAGNOSIS — I10 Essential (primary) hypertension: Secondary | ICD-10-CM | POA: Insufficient documentation

## 2023-09-06 DIAGNOSIS — R350 Frequency of micturition: Secondary | ICD-10-CM | POA: Diagnosis not present

## 2023-09-06 DIAGNOSIS — R739 Hyperglycemia, unspecified: Secondary | ICD-10-CM | POA: Diagnosis not present

## 2023-09-06 DIAGNOSIS — R351 Nocturia: Secondary | ICD-10-CM | POA: Diagnosis present

## 2023-09-06 LAB — POCT URINALYSIS DIP (MANUAL ENTRY)
Bilirubin, UA: NEGATIVE
Glucose, UA: NEGATIVE mg/dL
Ketones, POC UA: NEGATIVE mg/dL
Nitrite, UA: NEGATIVE
Protein Ur, POC: 30 mg/dL — AB
Spec Grav, UA: 1.02 (ref 1.010–1.025)
Urobilinogen, UA: 0.2 U/dL
pH, UA: 5.5 (ref 5.0–8.0)

## 2023-09-06 LAB — POCT FASTING CBG KUC MANUAL ENTRY: POCT Glucose (KUC): 167 mg/dL — AB (ref 70–99)

## 2023-09-06 NOTE — ED Triage Notes (Addendum)
 Pt c/o urinary frequency at night with some incontinence x2-3wks. States her sugars has been high in the mornings. As high as 400. States has a PCP appt 8/6. States taken her meds as scheduled.

## 2023-09-06 NOTE — ED Provider Notes (Signed)
 [facility]    CSN: [id number] Arrival date & time: 09/06/23  0805      History   Chief Complaint Chief Complaint  Patient presents with   Urinary Frequency    HPI Crystal Boyer is a 63 y.o. female.   Patient presents with urinary frequency that occurs overnight over the last 2 to 3 weeks.  Patient states that she has also had some urinary continence with this while she sleeps.  Denies dysuria, hematuria, urinary urgency, flank pain, abdominal pain, fever, body aches, and chills.  Patient does have a history of diabetes and reports that she does take her Lantus  daily as prescribed.  Patient states that she checks her blood glucose regularly and has been reading high in the mornings, sometimes as high as 400.  Patient states that she was previously taking glipizide, but they recently took her off of this so she is currently only taking Lantus  for her diabetes.  Of note patient's blood pressure is also elevated in clinic today.  Patient states that she is taking her losartan  and amlodipine  daily as prescribed.  Denies headache, blurred vision, dizziness, weakness, chest pain, shortness of breath, numbness, confusion, slurred speech, and facial droop.  Patient does report that she has a primary care appointment scheduled on 8/6.  The history is provided by the patient and medical records.  Urinary Frequency    Past Medical History:  Diagnosis Date   Allergy    Anemia    Asthma    Depression    Diabetes mellitus    Gout 12 days ago   Hepatitis C    Hypertension    Stroke [facility])     Patient Active Problem List   Diagnosis Date Noted   Acute UTI 01/28/2021   Uncontrolled diabetes mellitus with hyperglycemia (HCC) 01/27/2021   AKI (acute kidney injury) (HCC) 01/27/2021   Hives 01/27/2021   Noncompliance with medications 01/27/2021   Acute lower UTI 01/27/2021   History of CVA (cerebrovascular accident) 04/30/2020   Achilles tendon contracture, left  09/25/2017   Posterior tibial tendon dysfunction (PTTD) of right lower extremity 09/25/2017   DIABETES MELLITUS, TYPE II 11/27/2006   Essential hypertension 11/27/2006   MICROALBUMINURIA 11/27/2006    Past Surgical History:  Procedure Laterality Date   ABDOMINAL HYSTERECTOMY     ANKLE FRACTURE SURGERY     right ankle   CESAREAN SECTION     IR ANGIO INTRA EXTRACRAN SEL COM CAROTID INNOMINATE BILAT MOD SED  07/07/2020   IR ANGIO VERTEBRAL SEL VERTEBRAL UNI R MOD SED  07/07/2020   IR RADIOLOGIST EVAL & MGMT  06/19/2020   IR RADIOLOGIST EVAL & MGMT  09/22/2020   IR US  GUIDE VASC ACCESS RIGHT  07/07/2020    OB History   No obstetric history on file.      Home Medications    Prior to Admission medications   Medication Sig Start Date End Date Taking? Authorizing Provider  albuterol  (VENTOLIN  HFA) 108 (90 Base) MCG/ACT inhaler Inhale 1-2 puffs into the lungs every 6 (six) hours as needed for wheezing or shortness of breath. 04/11/23   Rolinda Rogue, MD  amLODipine  (NORVASC ) 5 MG tablet Take 1 tablet (5 mg total) by mouth 2 (two) times daily. 06/25/22   Smoot, Lauraine LABOR, PA-C  aspirin  EC 81 MG EC tablet Take 1 tablet (81 mg total) by mouth daily. Swallow whole. Patient taking differently: Take 81 mg by mouth daily. 05/02/20   Barbaraann Katz, MD  atorvastatin  (  LIPITOR ) 80 MG tablet Take 1 tablet (80 mg total) by mouth daily. Patient taking differently: Take 80 mg by mouth at bedtime. 09/02/20 08/11/22  Whitfield Raisin, NP  blood glucose meter kit and supplies KIT Dispense based on patient and insurance preference. Use up to four times daily as directed. 01/29/21   Gonfa, Taye T, MD  cetirizine  (ZYRTEC  ALLERGY) 10 MG tablet Take 1 tablet (10 mg total) by mouth daily. 08/07/23   Johnie Flaming A, NP  clopidogrel  (PLAVIX ) 75 MG tablet Take 75 mg by mouth daily. Patient not taking: Reported on 09/06/2023    [provider]  gabapentin  (NEURONTIN ) 100 MG capsule Take 100 mg by mouth 2 (two) times  daily. Patient not taking: Reported on 09/06/2023    [provider]  gabapentin  (NEURONTIN ) 300 MG capsule Take 1 capsule (300 mg total) by mouth 2 (two) times daily. 09/12/22 10/12/22  Immordino, Garnette, FNP  glipiZIDE (GLUCOTROL XL) 2.5 MG 24 hr tablet Take 1 tablet by mouth daily with breakfast. 02/02/22   [provider]  hydrocortisone  cream 1 % Apply to affected area 2 times daily 08/07/23   Johnie Flaming A, NP  insulin  aspart (NOVOLOG  FLEXPEN) 100 UNIT/ML FlexPen With meals and before bedtime, inject 1 unit for every 50 mg/DL increment greater than 150 mg/DL.  Maximum dose 7 units 4 times daily WC, 28 units/day. Patient not taking: Reported on 09/06/2023 08/11/22   Joesph Shaver Scales, PA-C  insulin  glargine (LANTUS ) 100 UNIT/ML injection Inject 0.35 mLs (35 Units total) into the skin daily. 08/11/22 09/14/22  Joesph Shaver Scales, PA-C  insulin  glargine (LANTUS ) 100 UNIT/ML injection Inject 30 Units into the skin at bedtime.    [provider]  losartan  (COZAAR ) 100 MG tablet Take 1 tablet (100 mg total) by mouth daily. 02/23/22   Rolinda Rogue, MD  triamcinolone  cream (KENALOG ) 0.1 % Apply 1 Application topically 2 (two) times daily. 05/25/22   Enedelia Dorna HERO, FNP    Family History Family History  Problem Relation Age of Onset   Hypertension Mother    Diabetes Mother    Hypertension Father    Diabetes Father    Colon cancer Neg Hx     Social History Social History   Tobacco Use   Smoking status: Never   Smokeless tobacco: Never  Vaping Use   Vaping status: Never Used  Substance Use Topics   Alcohol use: Yes    Comment: social   Drug use: Not Currently    Types: Marijuana    Comment: 1x monthly     Allergies   Brilinta  [ticagrelor ], Egg-derived products, Actos [pioglitazone], Glipizide, Diphenhydramine  hcl, and Tramadol   Review of Systems Review of Systems  Genitourinary:  Positive for frequency.   Per HPI  Physical Exam Triage  Vital Signs ED Triage Vitals [09/06/23 0835]  Encounter Vitals Group     BP (!) 170/97     Girls Systolic BP Percentile      Girls Diastolic BP Percentile      Boys Systolic BP Percentile      Boys Diastolic BP Percentile      Pulse Rate 77     Resp 18     Temp 97.9 F (36.6 C)     Temp Source Oral     SpO2 94 %     Weight      Height      Head Circumference      Peak Flow      Pain Score  0     Pain Loc      Pain Education      Exclude from Growth Chart    No data found.  Updated Vital Signs BP (!) 170/97 (BP Location: Right Arm)   Pulse 77   Temp 97.9 F (36.6 C) (Oral)   Resp 18   SpO2 94%   Visual Acuity Right Eye Distance:   Left Eye Distance:   Bilateral Distance:    Right Eye Near:   Left Eye Near:    Bilateral Near:     Physical Exam Vitals and nursing note reviewed.  Constitutional:      General: She is awake. She is not in acute distress.    Appearance: Normal appearance. She is well-developed and well-groomed. She is not ill-appearing.  Cardiovascular:     Rate and Rhythm: Normal rate and regular rhythm.  Pulmonary:     Effort: Pulmonary effort is normal.     Breath sounds: Normal breath sounds.  Skin:    General: Skin is warm and dry.  Neurological:     General: No focal deficit present.     Mental Status: She is alert and oriented to person, place, and time. Mental status is at baseline.     GCS: GCS eye subscore is 4. GCS verbal subscore is 5. GCS motor subscore is 6.     Cranial Nerves: Cranial nerves 2-12 are intact.     Sensory: Sensation is intact.     Motor: Motor function is intact.     Coordination: Coordination is intact.     Gait: Gait is intact.  Psychiatric:        Behavior: Behavior is cooperative.      UC Treatments / Results  Labs (all labs ordered are listed, but only abnormal results are displayed) Labs Reviewed  POCT URINALYSIS DIP (MANUAL ENTRY) - Abnormal; Notable for the following components:      Result Value    Blood, UA trace-lysed (*)    Protein Ur, POC =30 (*)    Leukocytes, UA Small (1+) (*)    All other components within normal limits  POCT FASTING CBG [facility] MANUAL ENTRY - Abnormal; Notable for the following components:   POCT Glucose ([facility]) 167 (*)    All other components within normal limits  URINE CULTURE    EKG   Radiology No results found.  Procedures Procedures (including critical care time)  Medications Ordered in UC Medications - No data to display  Initial Impression / Assessment and Plan / UC Course  I have reviewed the triage vital signs and the nursing notes.  Pertinent labs & imaging results that were available during my care of the patient were reviewed by me and considered in my medical decision making (see chart for details).     Patient is overall well-appearing.  Vitals are stable.  Blood pressure is elevated at 170/97.  No significant findings on exam.  No neurodeficits noted.  GCS 15.  Urinalysis reveals small leukocytes, protein, and trace RBCs.  Will send urine culture to rule out presence of urinary tract infection related to symptoms.  CBG is likely elevated at 167 in clinic.  There is no concern for diabetic ketoacidosis at this time due to lack of glucose and ketones in the urine as well as lack of related symptoms.  Recommended patient continue taking all medications as prescribed and keeping her appointment with her primary care provider on 8/6 to further evaluate and manage her diabetes and  blood pressure.  Discussed follow-up, return, and strict ER precautions. Final Clinical Impressions(s) / UC Diagnoses   Final diagnoses:  Urinary frequency  Nocturia  Hyperglycemia  Elevated blood pressure reading with diagnosis of hypertension     Discharge Instructions      As discussed I recommend continuing with your Lantus  as prescribed and continue taking your blood glucose as you normally do. Be sure to increase your water intake and decrease your  intake of sugary foods and carbohydrates. Make sure you are taking your blood pressure daily as prescribed as well. Keep your appointment with your primary care provider on 8/6 for further evaluation and management of your diabetes and blood pressure. Your urine culture results will come back over the next few days and someone will call if treatment is needed related to these results. Return here as needed.  If you develop severe dizziness, weakness, shortness of breath, persistent high glucose readings, or passing out please seek immediate medical treatment in the emergency department.   ED Prescriptions   None    PDMP not reviewed this encounter.   Johnie Flaming A, NP 09/06/23 [phone]

## 2023-09-06 NOTE — Discharge Instructions (Signed)
 As discussed I recommend continuing with your Lantus  as prescribed and continue taking your blood glucose as you normally do. Be sure to increase your water intake and decrease your intake of sugary foods and carbohydrates. Make sure you are taking your blood pressure daily as prescribed as well. Keep your appointment with your primary care provider on 8/6 for further evaluation and management of your diabetes and blood pressure. Your urine culture results will come back over the next few days and someone will call if treatment is needed related to these results. Return here as needed.  If you develop severe dizziness, weakness, shortness of breath, persistent high glucose readings, or passing out please seek immediate medical treatment in the emergency department.

## 2023-09-07 ENCOUNTER — Encounter (HOSPITAL_COMMUNITY): Payer: Self-pay

## 2023-09-07 ENCOUNTER — Other Ambulatory Visit: Payer: Self-pay

## 2023-09-07 ENCOUNTER — Emergency Department (HOSPITAL_COMMUNITY)

## 2023-09-07 ENCOUNTER — Emergency Department (HOSPITAL_COMMUNITY): Admission: EM | Admit: 2023-09-07 | Discharge: 2023-09-07 | Disposition: A | Discharge status: Home / Self Care

## 2023-09-07 DIAGNOSIS — J45909 Unspecified asthma, uncomplicated: Secondary | ICD-10-CM | POA: Diagnosis not present

## 2023-09-07 DIAGNOSIS — N200 Calculus of kidney: Secondary | ICD-10-CM | POA: Diagnosis not present

## 2023-09-07 DIAGNOSIS — I10 Essential (primary) hypertension: Secondary | ICD-10-CM | POA: Diagnosis not present

## 2023-09-07 DIAGNOSIS — Z794 Long term (current) use of insulin: Secondary | ICD-10-CM | POA: Insufficient documentation

## 2023-09-07 DIAGNOSIS — Z7901 Long term (current) use of anticoagulants: Secondary | ICD-10-CM | POA: Diagnosis not present

## 2023-09-07 DIAGNOSIS — Z7982 Long term (current) use of aspirin: Secondary | ICD-10-CM | POA: Insufficient documentation

## 2023-09-07 DIAGNOSIS — Z7951 Long term (current) use of inhaled steroids: Secondary | ICD-10-CM | POA: Insufficient documentation

## 2023-09-07 DIAGNOSIS — E119 Type 2 diabetes mellitus without complications: Secondary | ICD-10-CM | POA: Diagnosis not present

## 2023-09-07 DIAGNOSIS — Z79899 Other long term (current) drug therapy: Secondary | ICD-10-CM | POA: Insufficient documentation

## 2023-09-07 DIAGNOSIS — R1011 Right upper quadrant pain: Secondary | ICD-10-CM | POA: Diagnosis present

## 2023-09-07 LAB — COMPREHENSIVE METABOLIC PANEL WITH GFR
ALT: 13 U/L (ref 0–44)
AST: 17 U/L (ref 15–41)
Albumin: 3.2 g/dL — ABNORMAL LOW (ref 3.5–5.0)
Alkaline Phosphatase: 121 U/L (ref 38–126)
Anion gap: 11 (ref 5–15)
BUN: 39 mg/dL — ABNORMAL HIGH (ref 8–23)
CO2: 20 mmol/L — ABNORMAL LOW (ref 22–32)
Calcium: 9.1 mg/dL (ref 8.9–10.3)
Chloride: 105 mmol/L (ref 98–111)
Creatinine, Ser: 2.23 mg/dL — ABNORMAL HIGH (ref 0.44–1.00)
GFR, Estimated: 24 mL/min — ABNORMAL LOW (ref >60)
Glucose, Bld: 239 mg/dL — ABNORMAL HIGH (ref 70–99)
Potassium: 4.5 mmol/L (ref 3.5–5.1)
Sodium: 136 mmol/L (ref 135–145)
Total Bilirubin: 0.6 mg/dL (ref 0.0–1.2)
Total Protein: 7.2 g/dL (ref 6.5–8.1)

## 2023-09-07 LAB — CBC WITH DIFFERENTIAL/PLATELET
Abs Immature Granulocytes: 0.03 K/uL (ref 0.00–0.07)
Basophils Absolute: 0.1 K/uL (ref 0.0–0.1)
Basophils Relative: 1 %
Eosinophils Absolute: 0.3 K/uL (ref 0.0–0.5)
Eosinophils Relative: 3 %
HCT: 39.6 % (ref 36.0–46.0)
Hemoglobin: 12.7 g/dL (ref 12.0–15.0)
Immature Granulocytes: 0 %
Lymphocytes Relative: 18 %
Lymphs Abs: 1.9 K/uL (ref 0.7–4.0)
MCH: 27.3 pg (ref 26.0–34.0)
MCHC: 32.1 g/dL (ref 30.0–36.0)
MCV: 85.2 fL (ref 80.0–100.0)
Monocytes Absolute: 0.6 K/uL (ref 0.1–1.0)
Monocytes Relative: 6 %
Neutro Abs: 7.6 K/uL (ref 1.7–7.7)
Neutrophils Relative %: 72 %
Platelets: 290 K/uL (ref 150–400)
RBC: 4.65 MIL/uL (ref 3.87–5.11)
RDW: 13.3 % (ref 11.5–15.5)
WBC: 10.4 K/uL (ref 4.0–10.5)
nRBC: 0 % (ref 0.0–0.2)

## 2023-09-07 LAB — URINALYSIS, ROUTINE W REFLEX MICROSCOPIC
Bilirubin Urine: NEGATIVE
Glucose, UA: NEGATIVE mg/dL
Ketones, ur: NEGATIVE mg/dL
Nitrite: NEGATIVE
Protein, ur: NEGATIVE mg/dL
Specific Gravity, Urine: 1.011 (ref 1.005–1.030)
pH: 5 (ref 5.0–8.0)

## 2023-09-07 LAB — URINE CULTURE: Culture: <10000 — AB

## 2023-09-07 LAB — LIPASE, BLOOD: Lipase: 63 U/L — ABNORMAL HIGH (ref 11–51)

## 2023-09-07 MED ORDER — FENTANYL CITRATE PF 50 MCG/ML IJ SOSY
25.0000 ug | PREFILLED_SYRINGE | Freq: Once | INTRAMUSCULAR | Status: AC
  Administered 2023-09-07: 25 ug via INTRAVENOUS
  Filled 2023-09-07: qty 1

## 2023-09-07 MED ORDER — ONDANSETRON HCL 4 MG/2ML IJ SOLN
4.0000 mg | Freq: Once | INTRAMUSCULAR | Status: AC
  Administered 2023-09-07: 4 mg via INTRAVENOUS
  Filled 2023-09-07: qty 2

## 2023-09-07 MED ORDER — MORPHINE SULFATE (PF) 4 MG/ML IV SOLN
4.0000 mg | Freq: Once | INTRAVENOUS | Status: AC
  Administered 2023-09-07: 4 mg via INTRAVENOUS
  Filled 2023-09-07: qty 1

## 2023-09-07 MED ORDER — HYDROCODONE-ACETAMINOPHEN 5-325 MG PO TABS: 1.0000 | ORAL_TABLET | ORAL | 0 refills | Status: AC | PRN

## 2023-09-07 MED ORDER — SODIUM CHLORIDE 0.9 % IV BOLUS
1000.0000 mL | Freq: Once | INTRAVENOUS | Status: AC
  Administered 2023-09-07: 1000 mL via INTRAVENOUS

## 2023-09-07 NOTE — ED Notes (Signed)
 EDP at bedside

## 2023-09-07 NOTE — ED Triage Notes (Signed)
 Pt came in via POV d/t abd pain that radiates to her Rt flank area for the past 2-3 days. Does endorse frequency of urination & a few times of incontinence when trying to get to the restroom at night time. Denies any burning. A/Ox4, rates her pain 9/10 during triage.

## 2023-09-07 NOTE — ED Notes (Signed)
 Discharge instructions reviewed.   Newly prescribed medications discussed. Pharmacy verified.   Opportunity for questions and concerns provided.   Alert, oriented and ambulatory. Displays no signs of distress.

## 2023-09-07 NOTE — Discharge Instructions (Signed)
 Evaluation today revealed that you have a nonobstructing kidney stone.  Please follow-up with urology.  Your labs also indicated that your kidney function is somewhat reduced.  Please follow-up your PCP for recheck of your labs.  If you develop worsening flank or abdominal pain, develop a fever, burning with urination or any other concerning symptom please return to the ED for further evaluation.

## 2023-09-07 NOTE — ED Provider Triage Note (Signed)
 Emergency Medicine Provider Triage Evaluation Note  Crystal Boyer , a 63 y.o. female  was evaluated in triage.  Pt complains of R flank radiating to R lower abdomen with urinary frequency x 2 days, particularly worse at night. Seen by urgent care with negative culture yesterday.  Endorses chills today Denies fever, headache, vision changes, cough, congestion, chest pain, shortness of breath, n/v/d, vaginal bleeding/discharge/itching.  Review of Systems  Positive: N/a Negative: N/a  Physical Exam  BP 124/81   Pulse 100   Temp 97.9 F (36.6 C)   Resp 18   Ht 5' 6.5 (1.689 m)   Wt 102.1 kg   SpO2 96%   BMI 35.77 kg/m  Gen:   Awake, no distress   Resp:  Normal effort  MSK:   Moves extremities without difficulty  Other:    Medical Decision Making  Medically screening exam initiated at 6:42 PM.  Appropriate orders placed.  Crystal Boyer was informed that the remainder of the evaluation will be completed by another provider, this initial triage assessment does not replace that evaluation, and the importance of remaining in the ED until their evaluation is complete.     Beola Terrall RAMAN, [place] 09/07/23 [phone]

## 2023-09-07 NOTE — ED Provider Notes (Signed)
 [facility] Provider Note   CSN: [id number] Arrival date & time: 09/07/23  1816     Patient presents with: Abdominal Pain and Flank Pain  HPI Crystal Boyer is a 63 y.o. female presenting for right flank pain that started 3 days ago.  She states at times it does radiate into the right upper quadrant.  Denies vomiting and diarrhea but does endorse nausea.  Endorses frequency of urination but no other urinary symptoms.  Denies chest pain or shortness of breath.  The pain is sharp in nature.  Past Medical History:  Diagnosis Date   Allergy    Anemia    Asthma    Depression    Diabetes mellitus    Gout 12 days ago   Hepatitis C    Hypertension    Stroke [facility])        Abdominal Pain Flank Pain Associated symptoms include abdominal pain.       Prior to Admission medications   Medication Sig Start Date End Date Taking? Authorizing Provider  albuterol  (VENTOLIN  HFA) 108 (90 Base) MCG/ACT inhaler Inhale 1-2 puffs into the lungs every 6 (six) hours as needed for wheezing or shortness of breath. 04/11/23   Rolinda Rogue, MD  amLODipine  (NORVASC ) 5 MG tablet Take 1 tablet (5 mg total) by mouth 2 (two) times daily. 06/25/22   Smoot, Lauraine LABOR, PA-C  aspirin  EC 81 MG EC tablet Take 1 tablet (81 mg total) by mouth daily. Swallow whole. Patient taking differently: Take 81 mg by mouth daily. 05/02/20   Barbaraann Katz, MD  atorvastatin  (LIPITOR ) 80 MG tablet Take 1 tablet (80 mg total) by mouth daily. Patient taking differently: Take 80 mg by mouth at bedtime. 09/02/20 08/11/22  Whitfield Raisin, NP  blood glucose meter kit and supplies KIT Dispense based on patient and insurance preference. Use up to four times daily as directed. 01/29/21   Gonfa, Taye T, MD  cetirizine  (ZYRTEC  ALLERGY) 10 MG tablet Take 1 tablet (10 mg total) by mouth daily. 08/07/23   Johnie Flaming A, NP  clopidogrel  (PLAVIX ) 75 MG tablet Take 75 mg by mouth daily. Patient not  taking: Reported on 09/06/2023    [provider]  gabapentin  (NEURONTIN ) 100 MG capsule Take 100 mg by mouth 2 (two) times daily. Patient not taking: Reported on 09/06/2023    [provider]  gabapentin  (NEURONTIN ) 300 MG capsule Take 1 capsule (300 mg total) by mouth 2 (two) times daily. 09/12/22 10/12/22  Immordino, Garnette, FNP  glipiZIDE (GLUCOTROL XL) 2.5 MG 24 hr tablet Take 1 tablet by mouth daily with breakfast. 02/02/22   [provider]  hydrocortisone  cream 1 % Apply to affected area 2 times daily 08/07/23   Johnie Flaming A, NP  insulin  aspart (NOVOLOG  FLEXPEN) 100 UNIT/ML FlexPen With meals and before bedtime, inject 1 unit for every 50 mg/DL increment greater than 150 mg/DL.  Maximum dose 7 units 4 times daily WC, 28 units/day. Patient not taking: Reported on 09/06/2023 08/11/22   Joesph Shaver Scales, PA-C  insulin  glargine (LANTUS ) 100 UNIT/ML injection Inject 0.35 mLs (35 Units total) into the skin daily. 08/11/22 09/14/22  Joesph Shaver Scales, PA-C  insulin  glargine (LANTUS ) 100 UNIT/ML injection Inject 30 Units into the skin at bedtime.    [provider]  losartan  (COZAAR ) 100 MG tablet Take 1 tablet (100 mg total) by mouth daily. 02/23/22   Rolinda Rogue, MD  triamcinolone  cream (KENALOG ) 0.1 % Apply 1  Application topically 2 (two) times daily. 05/25/22   Enedelia Dorna HERO, FNP    Allergies: Brilinta  [ticagrelor ], Egg-derived products, Actos [pioglitazone], Glipizide, Diphenhydramine  hcl, and Tramadol    Review of Systems  Gastrointestinal:  Positive for abdominal pain.  Genitourinary:  Positive for flank pain.    Updated Vital Signs BP 111/83   Pulse 71   Temp (!) 97.5 F (36.4 C) (Oral)   Resp 20   Ht 5' 6.5 (1.689 m)   Wt 102.1 kg   SpO2 100%   BMI 35.77 kg/m   Physical Exam Vitals and nursing note reviewed.  HENT:     Head: Normocephalic and atraumatic.     Mouth/Throat:     Mouth: Mucous membranes are moist.  Eyes:      General:        Right eye: No discharge.        Left eye: No discharge.     Conjunctiva/sclera: Conjunctivae normal.  Cardiovascular:     Rate and Rhythm: Normal rate and regular rhythm.     Pulses: Normal pulses.     Heart sounds: Normal heart sounds.  Pulmonary:     Effort: Pulmonary effort is normal.     Breath sounds: Normal breath sounds.  Abdominal:     General: Abdomen is flat. There is no distension.     Palpations: Abdomen is soft.     Tenderness: There is right CVA tenderness.  Skin:    General: Skin is warm and dry.  Neurological:     General: No focal deficit present.  Psychiatric:        Mood and Affect: Mood normal.     (all labs ordered are listed, but only abnormal results are displayed) Labs Reviewed  URINALYSIS, ROUTINE W REFLEX MICROSCOPIC - Abnormal; Notable for the following components:      Result Value   APPearance HAZY (*)    Hgb urine dipstick SMALL (*)    Leukocytes,Ua MODERATE (*)    Bacteria, UA RARE (*)    All other components within normal limits  COMPREHENSIVE METABOLIC PANEL WITH GFR - Abnormal; Notable for the following components:   CO2 20 (*)    Glucose, Bld 239 (*)    BUN 39 (*)    Creatinine, Ser 2.23 (*)    Albumin 3.2 (*)    GFR, Estimated 24 (*)    All other components within normal limits  LIPASE, BLOOD - Abnormal; Notable for the following components:   Lipase 63 (*)    All other components within normal limits  CBC WITH DIFFERENTIAL/PLATELET    EKG: None  Radiology: CT ABDOMEN PELVIS WO CONTRAST Result Date: 09/07/2023 CLINICAL DATA:  Right lower quadrant pain for 2 days, initial encounter EXAM: CT ABDOMEN AND PELVIS WITHOUT CONTRAST TECHNIQUE: Multidetector CT imaging of the abdomen and pelvis was performed following the standard protocol without IV contrast. RADIATION DOSE REDUCTION: This exam was performed according to the departmental dose-optimization program which includes automated exposure control, adjustment of  the mA and/or kV according to patient size and/or use of iterative reconstruction technique. COMPARISON:  None Available. FINDINGS: Lower chest: No acute abnormality. Hepatobiliary: No focal liver abnormality is seen. No gallstones, gallbladder wall thickening, or biliary dilatation. Pancreas: Unremarkable. No pancreatic ductal dilatation or surrounding inflammatory changes. Spleen: Normal in size without focal abnormality. Adrenals/Urinary Tract: Adrenal glands are within normal limits. Kidneys demonstrate a nonobstructing right renal stone. The collecting system and ureters are within normal limits. The bladder is partially distended.  Stomach/Bowel: No obstructive or inflammatory changes of the colon are noted. The appendix is within normal limits. Minimal diverticular changes seen. Small bowel and stomach are unremarkable. Vascular/Lymphatic: Aortic atherosclerosis. No enlarged abdominal or pelvic lymph nodes. Reproductive: Status post hysterectomy. No adnexal masses. Other: No abdominal wall hernia or abnormality. No abdominopelvic ascites. Musculoskeletal: No acute or significant osseous findings. IMPRESSION: Nonobstructing right renal stone. Mild diverticular change. No other focal abnormality is noted. Electronically Signed   By: Oneil Devonshire M.D.   On: 09/07/2023 20:03     Procedures   Medications Ordered in the ED  sodium chloride  0.9 % bolus 1,000 mL (1,000 mLs Intravenous New Bag/Given 09/07/23 2210)  ondansetron  (ZOFRAN ) injection 4 mg (4 mg Intravenous Given 09/07/23 2211)  morphine  (PF) 4 MG/ML injection 4 mg (4 mg Intravenous Given 09/07/23 2213)  fentaNYL  (SUBLIMAZE ) injection 25 mcg (25 mcg Intravenous Given 09/07/23 2216)                                    Medical Decision Making Risk Prescription drug management.   Initial Impression and Ddx 63 year old well-appearing female presenting for right flank pain.  Exam notable for right CVA tenderness.  DDx includes kidney stone,  pyelonephritis, renal obstruction, gallbladder pathology, UTI, other. Patient PMH that increases complexity of ED encounter: Diabetes, history of stroke, anemia, asthma, hypertension, hepatitis C  Interpretation of Diagnostics - I independent reviewed and interpreted the labs as followed: Reduced GFR, hematuria, pyuria, elevated lipase  - I independently visualized the following imaging with scope of interpretation limited to determining acute life threatening conditions related to emergency care: CT ab/pelvis, which revealed nonobstructing right renal stone  -I personally reviewed and interpreted EKG which revealed NSR  Patient Reassessment and Ultimate Disposition/Management On reassessment, pain was well-controlled.  Discussed patient with urology resident Dr. Jacqulyn.  Advised that if pain is well-controlled that she is appropriate for discharge and urology follow-up.  Suspect that given no evidence of obstruction on CT that renal dysfunction could be more chronic in nature.  Advised follow-up with her PCP as well for recheck of her labs after fluid resuscitation here in the ED.  Sent Norco to her pharmacy.  Discussed return precautions.  Discharged good condition.  Patient management required discussion with the following services or consulting groups:  Urology  Complexity of Problems Addressed Acute complicated illness or Injury  Additional Data Reviewed and Analyzed Further history obtained from: Past medical history and medications listed in the EMR and Prior ED visit notes  Patient Encounter Risk Assessment Consideration of hospitalization      Final diagnoses:  Kidney stone    ED Discharge Orders     None          Daniell Paradise K, PA-C 09/07/23 2321    Kammerer, Megan L, DO 09/13/23 0800

## 2023-09-08 ENCOUNTER — Ambulatory Visit (HOSPITAL_COMMUNITY): Payer: Self-pay

## 2023-11-07 ENCOUNTER — Telehealth: Payer: Self-pay | Admitting: Neurology

## 2023-11-07 NOTE — Telephone Encounter (Signed)
Appt reminder Mychart

## 2023-11-09 ENCOUNTER — Ambulatory Visit: Admitting: Neurology

## 2023-11-09 ENCOUNTER — Encounter: Payer: Self-pay | Admitting: Neurology

## 2023-11-09 VITALS — BP 131/80 | HR 73 | Ht 66.5 in | Wt 246.0 lb

## 2023-11-09 DIAGNOSIS — Z8673 Personal history of transient ischemic attack (TIA), and cerebral infarction without residual deficits: Secondary | ICD-10-CM

## 2023-11-09 DIAGNOSIS — R413 Other amnesia: Secondary | ICD-10-CM | POA: Diagnosis not present

## 2023-11-09 DIAGNOSIS — Z8659 Personal history of other mental and behavioral disorders: Secondary | ICD-10-CM | POA: Diagnosis not present

## 2023-11-09 DIAGNOSIS — G3184 Mild cognitive impairment, so stated: Secondary | ICD-10-CM

## 2023-11-09 MED ORDER — BUPROPION HCL ER (XL) 150 MG PO TB24: 150.0000 mg | ORAL_TABLET | Freq: Every day | ORAL | 2 refills | Status: DC

## 2023-11-09 MED ORDER — MELATONIN 10 MG PO TABS: 10.0000 mg | ORAL_TABLET | ORAL | 0 refills | Status: AC

## 2023-11-09 MED ORDER — ASPIRIN 81 MG PO TBEC: 81.0000 mg | DELAYED_RELEASE_TABLET | Freq: Every day | ORAL | 12 refills | Status: DC

## 2023-11-09 NOTE — Patient Instructions (Signed)
 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  I recommend trial of Wellbutrin SR 150 mg daily for treatment for depression and check memory panel labs, EEG and MRI scan of the brain with MRA of the brain and neck to look for interval new strokes and worsening of her stenosis.  I also encouraged her to start taking melatonin 10 mg at [facility] to regularize her sleep-wake cycle.  Start aspirin  81 mg daily for stroke prevention and maintain aggressive risk factor modification with strict control of hypertension with blood pressure goal below 130/90, lipids with LDL cholesterol goal below 70 mg percent and diabetes with hemoglobin A1c goal below 6.5%.  We also discussed memory compensation strategies and advised her to increase participation in cognitively challenging activities like solving crossword puzzles, playing bridge and sudoku.  Return for follow-up in future in 3 months with nurse practitioner Harlene or call earlier if necessary. Memory Compensation Strategies  Use WARM strategy.  W= write it down  A= associate it  R= repeat it  M= make a mental note  2.   You can keep a Glass blower/designer.  Use a 3-ring notebook with sections for the following: calendar, important names and phone numbers,  medications, doctors' names/phone numbers, lists/reminders, and a section to journal what you did  each day.   3.    Use a calendar to write appointments down.  4.    Write yourself a schedule for the day.  This can be placed on the calendar or in a separate section of the Memory Notebook.  Keeping a  regular schedule can help memory.  5.    Use medication organizer with sections for each day or morning/evening pills.  You may need help loading it  6.    Keep a basket, or pegboard by the door.  Place items that you need to take  out with you in the basket or on the pegboard.  You may also want to  include a message board for reminders.  7.    Use sticky notes.  Place sticky notes with reminders in a place where the task is performed.  For example:  turn off the  stove placed by the stove, lock the door placed on the door at eye level,  take your medications on  the bathroom mirror or by the place where you normally take your medications.  8.    Use alarms/timers.  Use while cooking to remind yourself to check on food or as a reminder to take your medicine, or as a  reminder to make a call, or as a reminder to perform another task, etc.

## 2023-11-09 NOTE — Progress Notes (Signed)
 [facility] [address] [place]. [place] [address] [phone]       OFFICE CONSULT NOTE  Crystal Boyer Date of Birth:  [birth year] Medical Record Number:  [id number]   Referring MD: Annabella Rigg, NP  Reason for Referral: Memory loss and forgetfulness  HPI: Crystal Boyer is a 63 year old pleasant African-American lady seen today for office consultation visit for memory loss and forgetfulness.  She is accompanied by his son Medford.  History is obtained from them and review of electronic medical records.  I personally reviewed pertinent available imaging films in PACS.  She has past medical history of diabetes, hypertension, hyperlipidemia, obesity, asthma, anemia and gout.  Patient and son both feel that she has had some memory difficulties since her stroke in 2022.  She feels this may be slightly getting worse.  She has mostly poor short-term memory though long-term memory seems quite good.  Of late they have noticed certain days when she is disoriented and gets easily confused.  This is transient and improved.  The patient has previous lung standing history of depression for which she was on antidepressants but she has been off those medications for years and has continued to have some depressive symptoms but not been started back on medications.  She lives alone she is independent in actives of daily living.  She does socialize a bit.  She is still driving.  She has never gotten lost.  She denies any delusions, hallucinations or unsafe behavior.  There is no family history of anybody with dementia.  Patient has reported some mood swings but denies any thoughts of harming herself.  She has no trouble falling asleep but cannot sleep longer than 5 to 6 hours and wakes up often early in the morning and cannot go back to sleep. 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  MRI scan  of the brain showed right corona radiata and infarct with high-grade stenosis versus near occlusion of the right M2 middle cerebral.  Echocardiogram showed normal ejection fraction.  TCD bubble study was negative for PFO.  LDL cholesterol 128 mg and hemoglobin A1c was 8.2.  Patient was referred to Dr. Dolphus who recommended maximal medical therapy and no intervention of.  Patient was placed on dual antiplatelet therapy for 3 months and then aspirin  alone.  She recovered well with no residual deficits.  She however complained of mild short-term memory and cognitive difficulties since then have slightly gotten worse.. ROS:   14 system review of systems is positive for memory difficulties, depression disorientation, confusion all other systems negative  PMH:  Past Medical History:  Diagnosis Date   Allergy    Anemia    Asthma    Depression    Diabetes mellitus    Gout 12 days ago   Hepatitis C    Hypertension    Stroke [facility])     Social History:  Social History   Socioeconomic History   Marital status: Single    Spouse name: Not on file   Number of children: 3   Years of education: Not on file   Highest education level: Not on file  Occupational History    Comment: preschool teacher  Tobacco Use   Smoking status: Never   Smokeless tobacco: Never  Vaping Use   Vaping status: Never Used  Substance and Sexual Activity   Alcohol use: Not Currently    Comment: social  Drug use: Yes    Frequency: 7.0 times per week    Types: Marijuana    Comment: 1 joint daily   Sexual activity: Yes    Birth control/protection: Condom  Other Topics Concern   Not on file  Social History Narrative   Lives alone   Social Drivers of Health   Financial Resource Strain: Not on File (05/27/2021)   Received from [employer]    Financial Resource Strain: 0  Food Insecurity: Not on File (11/03/2022)   Received from [facility] Insecurity    Food: 0  Transportation Needs:  Not on File (05/27/2021)   Received from [employer] Needs    Transportation: 0  Physical Activity: Not on File (05/27/2021)   Received from [facility]   Physical Activity    Physical Activity: 0  Stress: Not on File (05/27/2021)   Received from [facility]   Stress    Stress: 0  Social Connections: Not on File (10/20/2022)   Received from [employer]   Social Connections    Connectedness: 0  Intimate Partner Violence: Not on file    Medications:   Current Outpatient Medications on File Prior to Visit  Medication Sig Dispense Refill   albuterol  (VENTOLIN  HFA) 108 (90 Base) MCG/ACT inhaler Inhale 1-2 puffs into the lungs every 6 (six) hours as needed for wheezing or shortness of breath. 1 each 1   amLODipine  (NORVASC ) 5 MG tablet Take 1 tablet (5 mg total) by mouth 2 (two) times daily. 60 tablet 1   blood glucose meter kit and supplies KIT Dispense based on patient and insurance preference. Use up to four times daily as directed. 1 each 0   gabapentin  (NEURONTIN ) 100 MG capsule Take 100 mg by mouth 2 (two) times daily.     insulin  glargine (LANTUS ) 100 UNIT/ML injection Inject 0.35 mLs (35 Units total) into the skin daily. 12 mL 0   insulin  glargine (LANTUS ) 100 UNIT/ML injection Inject 30 Units into the skin at bedtime. (Patient taking differently: Inject 35 Units into the skin at bedtime.)     losartan  (COZAAR ) 100 MG tablet Take 1 tablet (100 mg total) by mouth daily. 30 tablet 1   aspirin  EC 81 MG EC tablet Take 1 tablet (81 mg total) by mouth daily. Swallow whole. (Patient not taking: Reported on 11/09/2023) 30 tablet 11   atorvastatin  (LIPITOR ) 80 MG tablet Take 1 tablet (80 mg total) by mouth daily. (Patient not taking: Reported on 11/09/2023) 90 tablet 3   cetirizine  (ZYRTEC  ALLERGY) 10 MG tablet Take 1 tablet (10 mg total) by mouth daily. (Patient not taking: Reported on 11/09/2023) 30 tablet 0   clopidogrel  (PLAVIX ) 75 MG tablet Take 75 mg by mouth daily. (Patient not taking: Reported on  11/09/2023)     gabapentin  (NEURONTIN ) 300 MG capsule Take 1 capsule (300 mg total) by mouth 2 (two) times daily. (Patient not taking: Reported on 11/09/2023) 60 capsule 0   glipiZIDE (GLUCOTROL XL) 2.5 MG 24 hr tablet Take 1 tablet by mouth daily with breakfast. (Patient not taking: Reported on 11/09/2023)     HYDROcodone -acetaminophen  (NORCO/VICODIN) 5-325 MG tablet Take 1 tablet by mouth every 4 (four) hours as needed for severe pain (pain score 7-10). (Patient not taking: Reported on 11/09/2023) 8 tablet 0   hydrocortisone  cream 1 % Apply to affected area 2 times daily (Patient not taking: Reported on 11/09/2023) 15 g 0   insulin  aspart (NOVOLOG  FLEXPEN) 100 UNIT/ML FlexPen  With meals and before bedtime, inject 1 unit for every 50 mg/DL increment greater than 150 mg/DL.  Maximum dose 7 units 4 times daily WC, 28 units/day. (Patient not taking: Reported on 11/09/2023) 3 mL 0   triamcinolone  cream (KENALOG ) 0.1 % Apply 1 Application topically 2 (two) times daily. (Patient not taking: Reported on 11/09/2023) 30 g 0   No current facility-administered medications on file prior to visit.    Allergies:   Allergies  Allergen Reactions   Brilinta  [Ticagrelor ] Shortness Of Breath   Egg-Derived Products Diarrhea   Actos [Pioglitazone] Other (See Comments)    Made the patient feel like she was going to die   Glipizide Other (See Comments)    Made patient feel like she was going to pass out and die   Diphenhydramine  Hcl Rash   Tramadol Rash    Physical Exam General: well developed, well nourished, seated, in no evident distress Head: head normocephalic and atraumatic.   Neck: supple with no carotid or supraclavicular bruits Cardiovascular: regular rate and rhythm, no murmurs Musculoskeletal: no deformity Skin:  no rash/petichiae Vascular:  Normal pulses all extremities  Neurologic Exam Mental Status: Awake and fully alert. Oriented to place and time. Recent and remote memory  poor. Attention  span, concentration and fund of knowledge diminished. Mood and affect appropriate.  Diminished recall 1/3.  Clock drawing 4/4.  Able to name only 5 animals which can walk on forelegs.MoCA score 18/30  Cranial Nerves: Fundoscopic exam reveals sharp disc margins. Pupils equal, briskly reactive to light. Extraocular movements full without nystagmus. Visual fields full to confrontation. Hearing intact. Facial sensation intact. Face, tongue, palate moves normally and symmetrically.  Motor: Normal bulk and tone. Normal strength in all tested extremity muscles. Sensory.: intact to touch , pinprick , position and vibratory sensation.  Coordination: Rapid alternating movements normal in all extremities. Finger-to-nose and heel-to-shin performed accurately bilaterally. Gait and Station: Arises from chair without difficulty. Stance is normal. Gait demonstrates normal stride length and balance . Able to heel, toe and tandem walk without difficulty.  Reflexes: 1+ and symmetric. Toes downgoing.   NIHSS  0 Modified Rankin  2    11/09/2023    8:22 AM  Montreal Cognitive Assessment   Visuospatial/ Executive (0/5) 3  Naming (0/3) 3  Attention: Read list of digits (0/2) 2  Attention: Read list of letters (0/1) 1  Attention: Serial 7 subtraction starting at 100 (0/3) 0  Language: Repeat phrase (0/2) 2  Language : Fluency (0/1) 1  Abstraction (0/2) 2  Delayed Recall (0/5) 0  Orientation (0/6) 4  Total 18  Adjusted Score (based on education) 18     ASSESSMENT: 63 year old lady with memory loss and progressing cognitive difficulties following right subcortical stroke in 2022 likely due to combination of mild vascular cognitive impairment as well as underlying untreated depression.    PLAN: I had a long discussion with the patient and her son regarding her memory loss following her stroke in 2022 which likely represent combination of mild poststroke cognitive impairment versus underlying untreated  depression.  I recommend trial of Wellbutrin SR 150 mg daily for treatment for depression and check memory panel labs, EEG and MRI scan of the brain with MRA of the brain and neck to look for interval new strokes and worsening of her stenosis.  I also encouraged her to start taking melatonin 10 mg at [facility] to regularize her sleep-wake cycle.  Start aspirin  81 mg daily for stroke prevention and maintain aggressive  risk factor modification with strict control of hypertension with blood pressure goal below 130/90, lipids with LDL cholesterol goal below 70 mg percent and diabetes with hemoglobin A1c goal below 6.5%.  We also discussed memory compensation strategies and advised her to increase participation in cognitively challenging activities like solving crossword puzzles, playing bridge and sudoku.  Return for follow-up in future in 3 months with nurse practitioner Harlene or call earlier if necessary   I personally spent a total of 50 minutes in the care of the patient today including getting/reviewing separately obtained history, performing a medically appropriate exam/evaluation, counseling and educating, placing orders, referring and communicating with other health care professionals, documenting clinical information in the EHR, independently interpreting results, and coordinating care.       Eather Popp, MD Note: This document was prepared with digital dictation and possible smart phrase technology. Any transcriptional errors that result from this process are unintentional.

## 2023-11-10 LAB — HEMOGLOBIN A1C
Est. average glucose Bld gHb Est-mCnc: 252 mg/dL
Hgb A1c MFr Bld: 10.4 % — ABNORMAL HIGH (ref 4.8–5.6)

## 2023-11-10 LAB — DEMENTIA PANEL
Homocysteine: 9.6 umol/L (ref 0.0–17.2)
RPR Ser Ql: NONREACTIVE
TSH: 2.08 u[IU]/mL (ref 0.450–4.500)
Vitamin B-12: 729 pg/mL (ref 232–1245)

## 2023-11-10 LAB — LIPID PANEL
Chol/HDL Ratio: 4.3 ratio (ref 0.0–4.4)
Cholesterol, Total: 175 mg/dL (ref 100–199)
HDL: 41 mg/dL (ref >39)
LDL Chol Calc (NIH): 110 mg/dL — ABNORMAL HIGH (ref 0–99)
Triglycerides: 135 mg/dL (ref 0–149)
VLDL Cholesterol Cal: 24 mg/dL (ref 5–40)

## 2023-11-19 ENCOUNTER — Ambulatory Visit: Payer: Self-pay | Admitting: Neurology

## 2023-11-20 ENCOUNTER — Telehealth: Payer: Self-pay | Admitting: Neurology

## 2023-11-20 NOTE — Telephone Encounter (Signed)
 MRI Brain:  [facility] barrows pending case # [id number], [employer] barrows: [id number] exp. 11/13/2023-12/13/2023   MRA head:  [facility] barrows: [id number] exp. 11/20/23-01/04/24, [employer] barrows: [id number] exp. 11/13/2023-12/13/2023   MRA neck: [facility] barrows: [id number] exp. 11/20/23-01/04/24. [employer] denied stating she needs to do a carotid ultrasound first.  Due to insurance, these are being sent to schedule at [facility] [phone].

## 2023-11-21 ENCOUNTER — Ambulatory Visit (INDEPENDENT_AMBULATORY_CARE_PROVIDER_SITE_OTHER): Admitting: Neurology

## 2023-11-21 DIAGNOSIS — R4182 Altered mental status, unspecified: Secondary | ICD-10-CM

## 2023-11-21 DIAGNOSIS — R413 Other amnesia: Secondary | ICD-10-CM

## 2023-11-22 ENCOUNTER — Telehealth: Payer: Self-pay

## 2023-11-22 NOTE — Telephone Encounter (Signed)
 Patient called in today to check on results from EEG done yesterday. I advised that it had not been resulted yet but that a nurse would give her a call once it was.  She would also like to see if Dr Rosemarie could send something in for her to treat a bad headache she is having.

## 2023-11-22 NOTE — Telephone Encounter (Signed)
 Spoke with patient and advised her of recommendations. She has an appt with PCP tomorrow and will discuss it with them then. She appreciated the call back.

## 2023-11-24 ENCOUNTER — Ambulatory Visit (HOSPITAL_COMMUNITY)
Admission: RE | Admit: 2023-11-24 | Discharge: 2023-11-24 | Disposition: A | Source: Ambulatory Visit | Attending: Neurology | Admitting: Neurology

## 2023-11-24 ENCOUNTER — Ambulatory Visit (HOSPITAL_COMMUNITY)
Admission: RE | Admit: 2023-11-24 | Discharge: 2023-11-24 | Disposition: A | Discharge status: Home / Self Care | Source: Ambulatory Visit | Attending: Neurology | Admitting: Neurology

## 2023-11-24 DIAGNOSIS — R413 Other amnesia: Secondary | ICD-10-CM | POA: Insufficient documentation

## 2023-11-24 DIAGNOSIS — Z8673 Personal history of transient ischemic attack (TIA), and cerebral infarction without residual deficits: Secondary | ICD-10-CM | POA: Diagnosis present

## 2023-11-24 MED ORDER — GADOBUTROL 1 MMOL/ML IV SOLN
10.0000 mL | Freq: Once | INTRAVENOUS | Status: AC | PRN
  Administered 2023-11-24: 10 mL via INTRAVENOUS

## 2023-11-24 MED ORDER — GADOBUTROL 1 MMOL/ML IV SOLN: 10.0000 mL | Freq: Once | INTRAVENOUS | Status: DC | PRN

## 2023-11-28 ENCOUNTER — Telehealth: Payer: Self-pay | Admitting: Neurology

## 2023-11-28 NOTE — Telephone Encounter (Signed)
 Pt called to follow up about last EEG/MRI. Pt states that  she have not heard anything back  and wanted to follow Up. PT  states she is having a hard time using  Mychart and would like to be called  with Results if ready

## 2023-11-29 NOTE — Telephone Encounter (Signed)
 Pt called office , Informed Pt  MD  has not reviewed PT EEG / Mri once reviewed Pt will get a call . Pt understood and will wait on callback .

## 2023-12-04 ENCOUNTER — Telehealth: Payer: Self-pay | Admitting: Neurology

## 2023-12-04 NOTE — Telephone Encounter (Signed)
 See other phone note

## 2023-12-04 NOTE — Telephone Encounter (Signed)
 Called pt at [phone]. Relayed results per Dr. Bucky note.  She verbalized understanding.   She was told that d/t having personal hx high BP/stroke, she should not take bupropion 150mg  po every day. She is currently still taking and tolerating well. Aware I will route to Dr. Rosemarie to review and see if he agrees/wants to change therapy. Aware we will call her back once we receive response from MD.

## 2023-12-04 NOTE — Telephone Encounter (Signed)
 Pt stopped in this AM to [facility] medical records and said she wanted to let Dr. Rosemarie know that the medication she is taking, the Bupropion, is not working for her and she states that there are many reasons she should not have been put on it in the first place. If you have had a stroke, have liver problems, high BP, diabetes and a few others. Would like a phone call, not [facility] to discuss. [phone].

## 2023-12-04 NOTE — Telephone Encounter (Signed)
 Pt also would like MRI results mailed when ready

## 2023-12-07 NOTE — Telephone Encounter (Signed)
 Called pt and relayed Dr. Bucky message. Pt verbalized understanding.

## 2023-12-12 ENCOUNTER — Telehealth: Payer: Self-pay | Admitting: Neurology

## 2023-12-12 MED ORDER — BUPROPION HCL ER (XL) 150 MG PO TB24: 150.0000 mg | ORAL_TABLET | Freq: Every day | ORAL | 2 refills | Status: AC

## 2023-12-12 NOTE — Telephone Encounter (Signed)
 Last seen on 11/09/23 Follow up scheduled on 03/06/24

## 2023-12-12 NOTE — Telephone Encounter (Signed)
 Pt called to request medication be sent over to new Pharmacy buPROPion (WELLBUTRIN XL) 150 MG 24 hr tablet   Pt medication to    [facility]  Address: [address] [place], [place] [address]   [phone]

## 2024-02-09 ENCOUNTER — Other Ambulatory Visit: Payer: Self-pay | Admitting: Neurology

## 2024-02-12 ENCOUNTER — Other Ambulatory Visit: Payer: Self-pay

## 2024-02-13 ENCOUNTER — Ambulatory Visit (HOSPITAL_COMMUNITY)
Admission: EM | Admit: 2024-02-13 | Discharge: 2024-02-13 | Disposition: A | Discharge status: Home / Self Care | Attending: Family Medicine | Admitting: Family Medicine

## 2024-02-13 ENCOUNTER — Encounter (HOSPITAL_COMMUNITY): Payer: Self-pay

## 2024-02-13 DIAGNOSIS — R350 Frequency of micturition: Secondary | ICD-10-CM | POA: Diagnosis not present

## 2024-02-13 DIAGNOSIS — Z794 Long term (current) use of insulin: Secondary | ICD-10-CM | POA: Insufficient documentation

## 2024-02-13 DIAGNOSIS — R1011 Right upper quadrant pain: Secondary | ICD-10-CM | POA: Insufficient documentation

## 2024-02-13 DIAGNOSIS — E1165 Type 2 diabetes mellitus with hyperglycemia: Secondary | ICD-10-CM | POA: Insufficient documentation

## 2024-02-13 LAB — COMPREHENSIVE METABOLIC PANEL WITH GFR
ALT: 13 U/L (ref 0–44)
AST: 20 U/L (ref 15–41)
Albumin: 4.1 g/dL (ref 3.5–5.0)
Alkaline Phosphatase: 127 U/L — ABNORMAL HIGH (ref 38–126)
Anion gap: 12 (ref 5–15)
BUN: 25 mg/dL — ABNORMAL HIGH (ref 8–23)
CO2: 25 mmol/L (ref 22–32)
Calcium: 10.1 mg/dL (ref 8.9–10.3)
Chloride: 104 mmol/L (ref 98–111)
Creatinine, Ser: 1.24 mg/dL — ABNORMAL HIGH (ref 0.44–1.00)
GFR, Estimated: 49 mL/min — ABNORMAL LOW
Glucose, Bld: 116 mg/dL — ABNORMAL HIGH (ref 70–99)
Potassium: 5 mmol/L (ref 3.5–5.1)
Sodium: 141 mmol/L (ref 135–145)
Total Bilirubin: 0.3 mg/dL (ref 0.0–1.2)
Total Protein: 8.6 g/dL — ABNORMAL HIGH (ref 6.5–8.1)

## 2024-02-13 LAB — CBC WITH DIFFERENTIAL/PLATELET
Abs Immature Granulocytes: 0.05 K/uL (ref 0.00–0.07)
Basophils Absolute: 0 K/uL (ref 0.0–0.1)
Basophils Relative: 0 %
Eosinophils Absolute: 0.3 K/uL (ref 0.0–0.5)
Eosinophils Relative: 3 %
HCT: 40.1 % (ref 36.0–46.0)
Hemoglobin: 12.8 g/dL (ref 12.0–15.0)
Immature Granulocytes: 1 %
Lymphocytes Relative: 18 %
Lymphs Abs: 1.7 K/uL (ref 0.7–4.0)
MCH: 27.6 pg (ref 26.0–34.0)
MCHC: 31.9 g/dL (ref 30.0–36.0)
MCV: 86.4 fL (ref 80.0–100.0)
Monocytes Absolute: 0.4 K/uL (ref 0.1–1.0)
Monocytes Relative: 4 %
Neutro Abs: 6.7 K/uL (ref 1.7–7.7)
Neutrophils Relative %: 74 %
Platelets: 315 K/uL (ref 150–400)
RBC: 4.64 MIL/uL (ref 3.87–5.11)
RDW: 13.4 % (ref 11.5–15.5)
WBC: 9.1 K/uL (ref 4.0–10.5)
nRBC: 0 % (ref 0.0–0.2)

## 2024-02-13 LAB — POCT URINALYSIS DIP (MANUAL ENTRY)
Bilirubin, UA: NEGATIVE
Glucose, UA: NEGATIVE mg/dL
Ketones, POC UA: NEGATIVE mg/dL
Leukocytes, UA: NEGATIVE
Nitrite, UA: NEGATIVE
Protein Ur, POC: 30 mg/dL — AB
Spec Grav, UA: 1.015
Urobilinogen, UA: 0.2 U/dL
pH, UA: 6

## 2024-02-13 LAB — GLUCOSE, POCT (MANUAL RESULT ENTRY): POC Glucose: 134 mg/dL — AB (ref 70–99)

## 2024-02-13 LAB — LIPASE, BLOOD: Lipase: 23 U/L (ref 11–51)

## 2024-02-13 NOTE — Discharge Instructions (Addendum)
 You have had labs (lab work) sent today. We will call you with any significant abnormalities or if there is need to begin or change treatment or pursue further follow up.  You may also review your test results online through MyChart. If you do not have a MyChart account, instructions to sign up should be on your discharge paperwork.

## 2024-02-13 NOTE — ED Triage Notes (Addendum)
 Pt has c/o lower back pain, urinary frequency and abdominal pain x 2 weeks. Pt also complaints of burning when urinating that started yesterday, and wants to rule out UTI. Pt has been taking tylenol  at home with some relief, last dose at 10pm

## 2024-02-14 LAB — URINE CULTURE

## 2024-02-14 NOTE — ED Provider Notes (Signed)
 " [facility]   [id number] 02/13/24 Arrival Time: 9178  ASSESSMENT & PLAN:  1. Right upper quadrant abdominal pain   2. Urinary frequency   3. Type 2 diabetes mellitus with hyperglycemia, with long-term current use of insulin  (HCC)    Labs pending. Benign abdominal exam. No indications for urgent abdominal/pelvic imaging at this time. Ques gallbladder.  Has PCP to schedule f/u to discuss imaging.    Discharge Instructions      You have had labs (lab work) sent today. We will call you with any significant abnormalities or if there is need to begin or change treatment or pursue further follow up.  You may also review your test results online through MyChart. If you do not have a MyChart account, instructions to sign up should be on your discharge paperwork.     Follow-up Information     Duwaine Annabella SAILOR, FNP.   Specialty: Family Medicine Why: Keep your follow up appointment. Contact information: [address] [place] [address] [phone]         [facility] Emergency Department at [facility].   Specialty: Emergency Medicine Why: If symptoms worsen in any way. Contact information: [address] [place]   [address] [phone]                Reviewed expectations re: course of current medical issues. Questions answered. Outlined signs and symptoms indicating need for more acute intervention. Patient verbalized understanding. After Visit Summary given.   SUBJECTIVE: History from: patient. Crystal Boyer is a 64 y.o. female who presents with complaint of urinary frequency, upper R/mid abd pain, lower back pain; on/off x 2 weeks. Also questions some dysuria. Tylenol  with some help. Denies fever. Denies hematuria. Normal PO intake without n/v/d.  No LMP recorded. Patient has had a hysterectomy.  Past Surgical History:  Procedure Laterality Date   ABDOMINAL HYSTERECTOMY     ANKLE FRACTURE  SURGERY     right ankle   CESAREAN SECTION     IR ANGIO INTRA EXTRACRAN SEL COM CAROTID INNOMINATE BILAT MOD SED  07/07/2020   IR ANGIO VERTEBRAL SEL VERTEBRAL UNI R MOD SED  07/07/2020   IR RADIOLOGIST EVAL & MGMT  06/19/2020   IR RADIOLOGIST EVAL & MGMT  09/22/2020   IR US  GUIDE VASC ACCESS RIGHT  07/07/2020     OBJECTIVE:  Vitals:   02/13/24 0849  BP: (!) 139/92  Pulse: 81  Resp: 17  Temp: 98 F (36.7 C)  SpO2: 98%    General appearance: alert, oriented, no acute distress HEENT: [place]; AT; oropharynx moist Lungs: unlabored respirations Abdomen: soft; without distention; mild TTP over mid to R upper abdomen; normal bowel sounds; without masses or organomegaly; without guarding or rebound tenderness Back: without reported CVA tenderness; FROM at waist Extremities: without LE edema; symmetrical; without gross deformities Skin: warm and dry Neurologic: normal gait Psychological: alert and cooperative; normal mood and affect  Labs: Results for orders placed or performed during the hospital encounter of 02/13/24  POCT urinalysis dipstick   Collection Time: 02/13/24  9:06 AM  Result Value Ref Range   Color, UA yellow yellow   Clarity, UA clear clear   Glucose, UA negative negative mg/dL   Bilirubin, UA negative negative   Ketones, POC UA negative negative mg/dL   Spec Grav, UA 8.984 8.989 - 1.025   Blood, UA trace-intact (A) negative   pH, UA 6.0 5.0 - 8.0   Protein Ur, POC =  30 (A) negative mg/dL   Urobilinogen, UA 0.2 0.2 or 1.0 E.U./dL   Nitrite, UA Negative Negative   Leukocytes, UA Negative Negative  POCT glucose (manual entry)   Collection Time: 02/13/24  9:17 AM  Result Value Ref Range   POC Glucose 134 (A) 70 - 99 mg/dl  CBC with Differential/Platelet   Collection Time: 02/13/24  9:45 AM  Result Value Ref Range   WBC 9.1 4.0 - 10.5 K/uL   RBC 4.64 3.87 - 5.11 MIL/uL   Hemoglobin 12.8 12.0 - 15.0 g/dL   HCT 59.8 63.9 - 53.9 %   MCV 86.4 80.0 - 100.0 fL   MCH  27.6 26.0 - 34.0 pg   MCHC 31.9 30.0 - 36.0 g/dL   RDW 86.5 88.4 - 84.4 %   Platelets 315 150 - 400 K/uL   nRBC 0.0 0.0 - 0.2 %   Neutrophils Relative % 74 %   Neutro Abs 6.7 1.7 - 7.7 K/uL   Lymphocytes Relative 18 %   Lymphs Abs 1.7 0.7 - 4.0 K/uL   Monocytes Relative 4 %   Monocytes Absolute 0.4 0.1 - 1.0 K/uL   Eosinophils Relative 3 %   Eosinophils Absolute 0.3 0.0 - 0.5 K/uL   Basophils Relative 0 %   Basophils Absolute 0.0 0.0 - 0.1 K/uL   Immature Granulocytes 1 %   Abs Immature Granulocytes 0.05 0.00 - 0.07 K/uL  Comprehensive metabolic panel with GFR   Collection Time: 02/13/24  9:45 AM  Result Value Ref Range   Sodium 141 135 - 145 mmol/L   Potassium 5.0 3.5 - 5.1 mmol/L   Chloride 104 98 - 111 mmol/L   CO2 25 22 - 32 mmol/L   Glucose, Bld 116 (H) 70 - 99 mg/dL   BUN 25 (H) 8 - 23 mg/dL   Creatinine, Ser 8.75 (H) 0.44 - 1.00 mg/dL   Calcium  10.1 8.9 - 10.3 mg/dL   Total Protein 8.6 (H) 6.5 - 8.1 g/dL   Albumin 4.1 3.5 - 5.0 g/dL   AST 20 15 - 41 U/L   ALT 13 0 - 44 U/L   Alkaline Phosphatase 127 (H) 38 - 126 U/L   Total Bilirubin 0.3 0.0 - 1.2 mg/dL   GFR, Estimated 49 (L) >60 mL/min   Anion gap 12 5 - 15  Lipase, blood   Collection Time: 02/13/24  9:45 AM  Result Value Ref Range   Lipase 23 11 - 51 U/L   Labs Reviewed  COMPREHENSIVE METABOLIC PANEL WITH GFR - Abnormal; Notable for the following components:      Result Value   Glucose, Bld 116 (*)    BUN 25 (*)    Creatinine, Ser 1.24 (*)    Total Protein 8.6 (*)    Alkaline Phosphatase 127 (*)    GFR, Estimated 49 (*)    All other components within normal limits  POCT URINALYSIS DIP (MANUAL ENTRY) - Abnormal; Notable for the following components:   Blood, UA trace-intact (*)    Protein Ur, POC =30 (*)    All other components within normal limits  GLUCOSE, POCT (MANUAL RESULT ENTRY) - Abnormal; Notable for the following components:   POC Glucose 134 (*)    All other components within normal limits   URINE CULTURE  CBC WITH DIFFERENTIAL/PLATELET  LIPASE, BLOOD    Imaging: No results found.   Allergies[1]  Past Medical History:  Diagnosis Date   Allergy    Anemia    Asthma    Depression    Diabetes mellitus    Gout 12 days ago   Hepatitis C    Hypertension    Stroke [facility])     Social History   Socioeconomic History   Marital status: Single    Spouse name: Not on file   Number of children: 3   Years of education: Not on file   Highest education level: Not on file  Occupational History    Comment: preschool teacher  Tobacco Use   Smoking status: Never   Smokeless tobacco: Never  Vaping Use   Vaping status: Never Used  Substance and Sexual Activity   Alcohol use: Not Currently    Comment: social   Drug use: Yes    Frequency: 7.0 times per week    Types: Marijuana    Comment: 1 joint daily   Sexual activity: Yes    Birth control/protection: Condom  Other Topics Concern   Not on file  Social History Narrative   Lives alone   Social Drivers of Health   Tobacco Use: Low Risk (02/13/2024)   Patient History    Smoking Tobacco Use: Never    Smokeless Tobacco Use: Never    Passive Exposure: Not on file  Financial Resource Strain: Not on File (05/27/2021)   Received from [employer]    Financial Resource Strain: 0  Food Insecurity: Not on File (11/03/2022)   Received from [facility] [employer] Insecurity    Food: 0  Transportation Needs: Not on File (05/27/2021)   Received from [employer] Needs    Transportation: 0  Physical Activity: Not on File (05/27/2021)   Received from [facility] [facility]   Physical Activity    Physical Activity: 0  Stress: Not on File (05/27/2021)   Received from [facility]   Stress    Stress: 0  Social Connections: Not on File (10/20/2022)   Received from [employer]   Social Connections    Connectedness: 0  Intimate Partner Violence: Not on file  Depression (PHQ2-9): Not on file   Alcohol Screen: Not on file  Housing: Not on file  Utilities: Not on file  Health Literacy: Not on file    Family History  Problem Relation Age of Onset   Hypertension Mother    Diabetes Mother    Hypertension Father    Diabetes Father    Colon cancer Neg Hx    Stroke Neg Hx    Dementia Neg Hx       [1]  Allergies Allergen Reactions   Brilinta  [Ticagrelor ] Shortness Of Breath   Egg Protein-Containing Drug Products Diarrhea   Actos [Pioglitazone] Other (See Comments)    Made the patient feel like she was going to die   Glipizide Other (See Comments)    Made patient feel like she was going to pass out and die   Diphenhydramine  Hcl Rash   Tramadol Rash     Rolinda Rogue, MD 02/14/24 1200  "

## 2024-02-15 ENCOUNTER — Ambulatory Visit (HOSPITAL_COMMUNITY): Payer: Self-pay

## 2024-02-19 NOTE — Telephone Encounter (Signed)
 Pt called today about UC results, please advise

## 2024-02-19 NOTE — Telephone Encounter (Signed)
 Kidney function has improved from previous. All other results are essentially normal. Results do not reveal underlying cause for patient's complaints of right upper quadrant pain. - Crystal Boyer's note from the other day   If she has urinary symptoms she can return and we can re-collect the culture, it looked to be contaminated   I have spoke to pt, she is aware she needs to come back in for a recollect only for UCX to be sent out again no meds can be sent until we have that repeat UCX results.

## 2024-02-20 ENCOUNTER — Ambulatory Visit (HOSPITAL_COMMUNITY)
Admission: EM | Admit: 2024-02-20 | Discharge: 2024-02-20 | Disposition: A | Discharge status: Home / Self Care | Attending: Emergency Medicine | Admitting: Emergency Medicine

## 2024-02-20 ENCOUNTER — Encounter (HOSPITAL_COMMUNITY): Payer: Self-pay

## 2024-02-20 DIAGNOSIS — N390 Urinary tract infection, site not specified: Secondary | ICD-10-CM | POA: Insufficient documentation

## 2024-02-20 DIAGNOSIS — R300 Dysuria: Secondary | ICD-10-CM | POA: Insufficient documentation

## 2024-02-20 DIAGNOSIS — M2550 Pain in unspecified joint: Secondary | ICD-10-CM | POA: Insufficient documentation

## 2024-02-20 LAB — POCT URINE DIPSTICK
Bilirubin, UA: NEGATIVE
Blood, UA: NEGATIVE
Glucose, UA: NEGATIVE mg/dL
Ketones, POC UA: NEGATIVE mg/dL
Nitrite, UA: NEGATIVE
POC PROTEIN,UA: 30 — AB
Spec Grav, UA: 1.015
Urobilinogen, UA: 0.2 U/dL
pH, UA: 7

## 2024-02-20 MED ORDER — CEPHALEXIN 500 MG PO CAPS: 500.0000 mg | ORAL_CAPSULE | Freq: Four times a day (QID) | ORAL | 0 refills | Status: AC

## 2024-02-20 NOTE — ED Provider Notes (Signed)
 " [facility]    CSN: [id number] Arrival date & time: 02/20/24  0830      History   Chief Complaint Chief Complaint  Patient presents with   Urinary Tract Infection    HPI Crystal Boyer is a 64 y.o. female.   Crystal Boyer, 64 year old female, presents to urgent care for evaluation of urinary frequency and malodor x 1 week.  Patient complaining of joint pain all over and fatigue for a week.  Patient states she took Tylenol  but they were too strong.  The history is provided by the patient. No language interpreter was used.  Urinary Tract Infection Associated symptoms: no abdominal pain, no fever, no flank pain, no nausea and no vomiting     Past Medical History:  Diagnosis Date   Allergy    Anemia    Asthma    Depression    Diabetes mellitus    Gout 12 days ago   Hepatitis C    Hypertension    Stroke [facility])     Patient Active Problem List   Diagnosis Date Noted   Dysuria 02/20/2024   Arthralgia 02/20/2024   Acute UTI 01/28/2021   Uncontrolled diabetes mellitus with hyperglycemia (HCC) 01/27/2021   AKI (acute kidney injury) 01/27/2021   Hives 01/27/2021   Noncompliance with medications 01/27/2021   Acute lower UTI 01/27/2021   History of CVA (cerebrovascular accident) 04/30/2020   Achilles tendon contracture, left 09/25/2017   Posterior tibial tendon dysfunction (PTTD) of right lower extremity 09/25/2017   DIABETES MELLITUS, TYPE II 11/27/2006   Essential hypertension 11/27/2006   MICROALBUMINURIA 11/27/2006    Past Surgical History:  Procedure Laterality Date   ABDOMINAL HYSTERECTOMY     ANKLE FRACTURE SURGERY     right ankle   CESAREAN SECTION     IR ANGIO INTRA EXTRACRAN SEL COM CAROTID INNOMINATE BILAT MOD SED  07/07/2020   IR ANGIO VERTEBRAL SEL VERTEBRAL UNI R MOD SED  07/07/2020   IR RADIOLOGIST EVAL & MGMT  06/19/2020   IR RADIOLOGIST EVAL & MGMT  09/22/2020   IR US  GUIDE VASC ACCESS RIGHT  07/07/2020    OB History   No  obstetric history on file.      Home Medications    Prior to Admission medications  Medication Sig Start Date End Date Taking? Authorizing Provider  cephALEXin  (KEFLEX ) 500 MG capsule Take 1 capsule (500 mg total) by mouth 4 (four) times daily for 7 days. 02/20/24 02/27/24 Yes Zimal Weisensel, Rilla, NP  albuterol  (VENTOLIN  HFA) 108 (90 Base) MCG/ACT inhaler Inhale 1-2 puffs into the lungs every 6 (six) hours as needed for wheezing or shortness of breath. 04/11/23   Rolinda Rogue, MD  amLODipine  (NORVASC ) 5 MG tablet Take 1 tablet (5 mg total) by mouth 2 (two) times daily. 06/25/22   Smoot, Lauraine LABOR, PA-C  blood glucose meter kit and supplies KIT Dispense based on patient and insurance preference. Use up to four times daily as directed. 01/29/21   Gonfa, Taye T, MD  buPROPion  (WELLBUTRIN  XL) 150 MG 24 hr tablet Take 1 tablet (150 mg total) by mouth daily. 12/12/23   Rosemarie Eather RAMAN, MD  cetirizine  (ZYRTEC  ALLERGY) 10 MG tablet Take 1 tablet (10 mg total) by mouth daily. Patient not taking: Reported on 11/09/2023 08/07/23   Johnie Flaming A, NP  HYDROcodone -acetaminophen  (NORCO/VICODIN) 5-325 MG tablet Take 1 tablet by mouth every 4 (four) hours as needed for severe pain (pain score 7-10). Patient not taking: Reported  on 11/09/2023 09/07/23   Harder, John K, PA-C  hydrocortisone  cream 1 % Apply to affected area 2 times daily Patient not taking: Reported on 11/09/2023 08/07/23   Johnie Flaming A, NP  insulin  aspart (NOVOLOG  FLEXPEN) 100 UNIT/ML FlexPen With meals and before bedtime, inject 1 unit for every 50 mg/DL increment greater than 150 mg/DL.  Maximum dose 7 units 4 times daily WC, 28 units/day. Patient not taking: Reported on 11/09/2023 08/11/22   Joesph Shaver Scales, PA-C  insulin  glargine (LANTUS ) 100 UNIT/ML injection Inject 0.35 mLs (35 Units total) into the skin daily. 08/11/22 11/09/23  Joesph Shaver Scales, PA-C  insulin  glargine (LANTUS ) 100 UNIT/ML injection Inject 30 Units into the skin at  bedtime. Patient taking differently: Inject 35 Units into the skin at bedtime.    [provider]  losartan  (COZAAR ) 100 MG tablet Take 1 tablet (100 mg total) by mouth daily. 02/23/22   Rolinda Rogue, MD  pravastatin (PRAVACHOL) 80 MG tablet Take 80 mg by mouth daily. 11/22/22   [provider]  triamcinolone  cream (KENALOG ) 0.1 % Apply 1 Application topically 2 (two) times daily. Patient not taking: Reported on 11/09/2023 05/25/22   Enedelia Dorna HERO, FNP    Family History Family History  Problem Relation Age of Onset   Hypertension Mother    Diabetes Mother    Hypertension Father    Diabetes Father    Colon cancer Neg Hx    Stroke Neg Hx    Dementia Neg Hx     Social History Social History[1]   Allergies   Brilinta  [ticagrelor ], Egg protein-containing drug products, Actos [pioglitazone], Glipizide, Diphenhydramine  hcl, and Tramadol   Review of Systems Review of Systems  Constitutional:  Positive for fatigue. Negative for fever.  Gastrointestinal:  Negative for abdominal pain, diarrhea, nausea and vomiting.  Genitourinary:  Positive for dysuria and frequency. Negative for difficulty urinating, flank pain and hematuria.  Musculoskeletal:  Positive for arthralgias.  All other systems reviewed and are negative.    Physical Exam Triage Vital Signs ED Triage Vitals  Encounter Vitals Group     BP 02/20/24 0936 (!) 151/88     Girls Systolic BP Percentile --      Girls Diastolic BP Percentile --      Boys Systolic BP Percentile --      Boys Diastolic BP Percentile --      Pulse Rate 02/20/24 0936 75     Resp 02/20/24 0936 18     Temp 02/20/24 0936 98.9 F (37.2 C)     Temp Source 02/20/24 0936 Oral     SpO2 --      Weight --      Height --      Head Circumference --      Peak Flow --      Pain Score 02/20/24 0935 9     Pain Loc --      Pain Education --      Exclude from Growth Chart --    No data found.  Updated Vital Signs BP (!) 151/88  (BP Location: Left Arm)   Pulse 75   Temp 98.9 F (37.2 C) (Oral)   Resp 18   Visual Acuity Right Eye Distance:   Left Eye Distance:   Bilateral Distance:    Right Eye Near:   Left Eye Near:    Bilateral Near:     Physical Exam Vitals and nursing note reviewed.  Constitutional:      General: She is  not in acute distress.    Appearance: She is well-developed.  HENT:     Head: Normocephalic and atraumatic.  Eyes:     Conjunctiva/sclera: Conjunctivae normal.  Cardiovascular:     Rate and Rhythm: Normal rate and regular rhythm.     Heart sounds: Normal heart sounds. No murmur heard. Pulmonary:     Effort: Pulmonary effort is normal. No respiratory distress.     Breath sounds: Normal breath sounds and air entry.  Abdominal:     Palpations: Abdomen is soft.     Tenderness: There is no abdominal tenderness.  Musculoskeletal:        General: No swelling.     Cervical back: Neck supple.  Skin:    General: Skin is warm and dry.     Capillary Refill: Capillary refill takes less than 2 seconds.  Neurological:     General: No focal deficit present.     Mental Status: She is alert and oriented to person, place, and time.     GCS: GCS eye subscore is 4. GCS verbal subscore is 5. GCS motor subscore is 6.  Psychiatric:        Mood and Affect: Mood normal.      UC Treatments / Results  Labs (all labs ordered are listed, but only abnormal results are displayed) Labs Reviewed  POCT URINE DIPSTICK - Abnormal; Notable for the following components:      Result Value   Clarity, UA turbid (*)    POC PROTEIN,UA =30 (*)    Leukocytes, UA Small (1+) (*)    All other components within normal limits  URINE CULTURE    EKG   Radiology No results found.  Procedures Procedures (including critical care time)  Medications Ordered in UC Medications - No data to display  Initial Impression / Assessment and Plan / UC Course  I have reviewed the triage vital signs and the nursing  notes.  Pertinent labs & imaging results that were available during my care of the patient were reviewed by me and considered in my medical decision making (see chart for details).    Discussed exam findings and plan of care with pt: You urine is positive for UTI. Take antibiotic as directed(keflex ). Drink plenty of water, follow up with PCP. May take OTC tylenol  as label directed for pain if too feels like too many or too strong take 1 instead, may try biofreeze, ben gay topical as label directed.  Go to Er for new or worsening issues or concerns(fever, nausea, vomiting, unable to keep meds down, muscle aches, etc)  Ddx: Acute UTI, dysuria Final Clinical Impressions(s) / UC Diagnoses   Final diagnoses:  Acute UTI  Dysuria  Arthralgia, unspecified joint     Discharge Instructions      You urine is positive for UTI. Take antibiotic as directed(keflex ). Drink plenty of water, follow up with PCP. May take OTC tylenol  as label directed for pain if too feels like too many or too strong take 1 instead, may try biofreeze, ben gay topical as label directed.  Go to Er for new or worsening issues or concerns(fever, nausea, vomiting, unable to keep meds down, muscle aches, etc)  Follow up with PCP     ED Prescriptions     Medication Sig Dispense Auth. Provider   cephALEXin  (KEFLEX ) 500 MG capsule Take 1 capsule (500 mg total) by mouth 4 (four) times daily for 7 days. 28 capsule Aveena Bari, Rilla, NP      PDMP  not reviewed this encounter.     [1]  Social History Tobacco Use   Smoking status: Never   Smokeless tobacco: Never  Vaping Use   Vaping status: Never Used  Substance Use Topics   Alcohol use: Not Currently    Comment: social   Drug use: Not Currently    Frequency: 7.0 times per week    Types: Marijuana    Comment: 1 joint daily     Cristopher Ciccarelli, NP 02/20/24 1021  "

## 2024-02-20 NOTE — ED Triage Notes (Signed)
 Pt c/o urinary frequency with odor x1wk. States told to return for UC recollect if no better. Pt c/o joint pain all over and having fatigue for the past week. Took tylenol  but states they were too strong.

## 2024-02-20 NOTE — Discharge Instructions (Addendum)
 You urine is positive for UTI. Take antibiotic as directed(keflex ). Drink plenty of water, follow up with PCP. May take OTC tylenol  as label directed for pain if too feels like too many or too strong take 1 instead, may try biofreeze, ben gay topical as label directed.  Go to Er for new or worsening issues or concerns(fever, nausea, vomiting, unable to keep meds down, muscle aches, etc)  Follow up with PCP

## 2024-02-21 LAB — URINE CULTURE
Culture: NO GROWTH
Special Requests: NORMAL

## 2024-02-22 ENCOUNTER — Other Ambulatory Visit: Payer: Self-pay | Admitting: Family

## 2024-02-22 ENCOUNTER — Ambulatory Visit (HOSPITAL_COMMUNITY): Payer: Self-pay

## 2024-02-22 DIAGNOSIS — Z1231 Encounter for screening mammogram for malignant neoplasm of breast: Secondary | ICD-10-CM

## 2024-03-05 NOTE — Progress Notes (Unsigned)
 " [facility] [address] [place]. [place] [address] [phone]       OFFICE CONSULT NOTE  [name] Date of Birth:  10/03/1960 Medical Record Number:  [id number]   Primary neurologist: Dr. Rosemarie Reason for visit: Memory concerns   No chief complaint on file.      HPI:   Update 03/06/2024 JM: Patient returns for follow-up visit regarding memory concerns.  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  Concern of underlying depression contributing to symptoms and was started on bupropion  150 mg daily.           Consult visit 11/09/2023 Dr. Rosemarie: [name] is a 64 year old pleasant African-American lady seen today for office consultation visit for memory loss and forgetfulness.  She is accompanied by his son Medford.  History is obtained from them and review of electronic medical records.  I personally reviewed pertinent available imaging films in PACS.  She has past medical history of diabetes, hypertension, hyperlipidemia, obesity, asthma, anemia and gout.  Patient and son both feel that she has had some memory difficulties since her stroke in 2022.  She feels this may be slightly getting worse.  She has mostly poor short-term memory though long-term memory seems quite good.  Of late they have noticed certain days when she is disoriented and gets easily confused.  This is transient and improved.  The patient has previous lung standing history of depression for which she was on antidepressants but she has been off those medications for years and has continued to have some depressive  symptoms but not been started back on medications.  She lives alone she is independent in actives of daily living.  She does socialize a bit.  She is still driving.  She has never gotten lost.  She denies any delusions, hallucinations or unsafe behavior.  There is no family history of anybody with dementia.  Patient has reported some mood swings but denies any thoughts of harming herself.  She has no trouble falling asleep but cannot sleep longer than 5 to 6 hours and wakes up often early in the morning and cannot go back to sleep. 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  MRI scan of the brain showed right corona radiata and infarct with high-grade stenosis versus near occlusion of the right M2 middle cerebral.  Echocardiogram showed normal ejection fraction.  TCD bubble study was negative for PFO.  LDL cholesterol 128 mg and hemoglobin A1c was 8.2.  Patient was referred to Dr. Dolphus who recommended maximal medical therapy and no intervention of.  Patient was placed on dual antiplatelet therapy for 3 months and then aspirin  alone.  She recovered well with no residual deficits.  She however complained of mild short-term memory and cognitive difficulties since then have slightly gotten worse.    ROS:   14 system review of systems is positive for memory difficulties, depression disorientation, confusion all other systems negative  PMH:  Past Medical History:  Diagnosis Date   Allergy    Anemia    Asthma    Depression  Diabetes mellitus    Gout 12 days ago   Hepatitis C    Hypertension    Stroke [facility])     Social History:  Social History   Socioeconomic History   Marital status: Single    Spouse name: Not on file   Number of children: 3   Years of education: Not on file   Highest education level: Not on file  Occupational History    Comment: preschool teacher  Tobacco Use   Smoking  status: Never   Smokeless tobacco: Never  Vaping Use   Vaping status: Never Used  Substance and Sexual Activity   Alcohol use: Not Currently    Comment: social   Drug use: Not Currently    Frequency: 7.0 times per week    Types: Marijuana    Comment: 1 joint daily   Sexual activity: Yes    Birth control/protection: Condom  Other Topics Concern   Not on file  Social History Narrative   Lives alone   Social Drivers of Health   Tobacco Use: Low Risk (02/20/2024)   Patient History    Smoking Tobacco Use: Never    Smokeless Tobacco Use: Never    Passive Exposure: Not on file  Financial Resource Strain: Not on File (05/27/2021)   Received from [employer]    Financial Resource Strain: 0  Food Insecurity: Not on File (11/03/2022)   Received from [facility] Insecurity    Food: 0  Transportation Needs: Not on File (05/27/2021)   Received from [employer] Needs    Transportation: 0  Physical Activity: Not on File (05/27/2021)   Received from [facility]   Physical Activity    Physical Activity: 0  Stress: Not on File (05/27/2021)   Received from [facility]   Stress    Stress: 0  Social Connections: Not on File (10/20/2022)   Received from [facility]   Social Connections    Connectedness: 0  Intimate Partner Violence: Not on file  Depression (PHQ2-9): Not on file  Alcohol Screen: Not on file  Housing: Not on file  Utilities: Not on file  Health Literacy: Not on file    Medications:   Current Outpatient Medications on File Prior to Visit  Medication Sig Dispense Refill   albuterol  (VENTOLIN  HFA) 108 (90 Base) MCG/ACT inhaler Inhale 1-2 puffs into the lungs every 6 (six) hours as needed for wheezing or shortness of breath. 1 each 1   amLODipine  (NORVASC ) 5 MG tablet Take 1 tablet (5 mg total) by mouth 2 (two) times daily. 60 tablet 1   blood glucose meter kit and supplies KIT Dispense based on patient and insurance preference. Use up to four times daily as  directed. 1 each 0   buPROPion  (WELLBUTRIN  XL) 150 MG 24 hr tablet Take 1 tablet (150 mg total) by mouth daily. 30 tablet 2   cetirizine  (ZYRTEC  ALLERGY) 10 MG tablet Take 1 tablet (10 mg total) by mouth daily. (Patient not taking: Reported on 11/09/2023) 30 tablet 0   HYDROcodone -acetaminophen  (NORCO/VICODIN) 5-325 MG tablet Take 1 tablet by mouth every 4 (four) hours as needed for severe pain (pain score 7-10). (Patient not taking: Reported on 11/09/2023) 8 tablet 0   hydrocortisone  cream 1 % Apply to affected area 2 times daily (Patient not taking: Reported on 11/09/2023) 15 g 0   insulin  aspart (NOVOLOG  FLEXPEN) 100 UNIT/ML FlexPen With meals and before bedtime, inject 1 unit for every 50 mg/DL increment  greater than 150 mg/DL.  Maximum dose 7 units 4 times daily WC, 28 units/day. (Patient not taking: Reported on 11/09/2023) 3 mL 0   insulin  glargine (LANTUS ) 100 UNIT/ML injection Inject 0.35 mLs (35 Units total) into the skin daily. 12 mL 0   insulin  glargine (LANTUS ) 100 UNIT/ML injection Inject 30 Units into the skin at bedtime. (Patient taking differently: Inject 35 Units into the skin at bedtime.)     losartan  (COZAAR ) 100 MG tablet Take 1 tablet (100 mg total) by mouth daily. 30 tablet 1   pravastatin (PRAVACHOL) 80 MG tablet Take 80 mg by mouth daily.     triamcinolone  cream (KENALOG ) 0.1 % Apply 1 Application topically 2 (two) times daily. (Patient not taking: Reported on 11/09/2023) 30 g 0   No current facility-administered medications on file prior to visit.    Allergies:   Allergies  Allergen Reactions   Brilinta  [Ticagrelor ] Shortness Of Breath   Egg Protein-Containing Drug Products Diarrhea   Actos [Pioglitazone] Other (See Comments)    Made the patient feel like she was going to die   Glipizide Other (See Comments)    Made patient feel like she was going to pass out and die   Diphenhydramine  Hcl Rash   Tramadol Rash    Physical Exam General: well developed, well  nourished, seated, in no evident distress Head: head normocephalic and atraumatic.   Neck: supple with no carotid or supraclavicular bruits Cardiovascular: regular rate and rhythm, no murmurs Musculoskeletal: no deformity Skin:  no rash/petichiae Vascular:  Normal pulses all extremities  Neurologic Exam Mental Status: Awake and fully alert. Oriented to place and time. Recent and remote memory  poor. Attention span, concentration and fund of knowledge diminished. Mood and affect appropriate.  Diminished recall 1/3.  Clock drawing 4/4.  Able to name only 5 animals which can walk on forelegs.MoCA score 18/30  Cranial Nerves: Fundoscopic exam reveals sharp disc margins. Pupils equal, briskly reactive to light. Extraocular movements full without nystagmus. Visual fields full to confrontation. Hearing intact. Facial sensation intact. Face, tongue, palate moves normally and symmetrically.  Motor: Normal bulk and tone. Normal strength in all tested extremity muscles. Sensory.: intact to touch , pinprick , position and vibratory sensation.  Coordination: Rapid alternating movements normal in all extremities. Finger-to-nose and heel-to-shin performed accurately bilaterally. Gait and Station: Arises from chair without difficulty. Stance is normal. Gait demonstrates normal stride length and balance . Able to heel, toe and tandem walk without difficulty.  Reflexes: 1+ and symmetric. Toes downgoing.      11/09/2023    8:22 AM  Montreal Cognitive Assessment   Visuospatial/ Executive (0/5) 3  Naming (0/3) 3  Attention: Read list of digits (0/2) 2  Attention: Read list of letters (0/1) 1  Attention: Serial 7 subtraction starting at 100 (0/3) 0  Language: Repeat phrase (0/2) 2  Language : Fluency (0/1) 1  Abstraction (0/2) 2  Delayed Recall (0/5) 0  Orientation (0/6) 4  Total 18  Adjusted Score (based on education) 18       ASSESSMENT: 64 year old lady with memory loss and progressing cognitive  difficulties following right subcortical stroke in 2022 likely due to combination of mild vascular cognitive impairment as well as underlying untreated depression.    PLAN:    I had a long discussion with the patient and her son regarding her memory loss following her stroke in 2022 which likely represent combination of mild poststroke cognitive impairment versus underlying untreated depression.  I recommend  trial of Wellbutrin  SR 150 mg daily for treatment for depression and check memory panel labs, EEG and MRI scan of the brain with MRA of the brain and neck to look for interval new strokes and worsening of her stenosis.  I also encouraged her to start taking melatonin 10 mg at [facility] to regularize her sleep-wake cycle.  Start aspirin  81 mg daily for stroke prevention and maintain aggressive risk factor modification with strict control of hypertension with blood pressure goal below 130/90, lipids with LDL cholesterol goal below 70 mg percent and diabetes with hemoglobin A1c goal below 6.5%.  We also discussed memory compensation strategies and advised her to increase participation in cognitively challenging activities like solving crossword puzzles, playing bridge and sudoku.  Return for follow-up in future in 3 months with nurse practitioner Harlene or call earlier if necessary    I personally spent a total of *** minutes in the care of the patient today including {Time Based Coding:210964241}.  Harlene Bogaert, AGNP-BC  [facility] [address] [place], [place] [address]  Phone [phone] Fax [phone] Note: This document was prepared with digital dictation and possible smart phrase technology. Any transcriptional errors that result from this process are unintentional.    "

## 2024-03-06 ENCOUNTER — Encounter: Payer: Self-pay | Admitting: Adult Health

## 2024-03-06 ENCOUNTER — Ambulatory Visit: Admitting: Adult Health

## 2024-03-07 ENCOUNTER — Telehealth: Payer: Self-pay | Admitting: Neurology

## 2024-03-07 NOTE — Telephone Encounter (Signed)
 Request to reschedule appointment

## 2024-03-08 ENCOUNTER — Ambulatory Visit

## 2024-07-12 ENCOUNTER — Ambulatory Visit: Admitting: Adult Health
